# Patient Record
Sex: Female | Born: 1955 | Race: White | Hispanic: No | Smoking: Former smoker
Health system: Southern US, Community
[De-identification: ages and names within clinical notes are randomized; demographics above are authoritative.]

## PROBLEM LIST (undated history)

## (undated) DIAGNOSIS — E669 Obesity, unspecified: Secondary | ICD-10-CM

## (undated) DIAGNOSIS — E785 Hyperlipidemia, unspecified: Secondary | ICD-10-CM

## (undated) DIAGNOSIS — I10 Essential (primary) hypertension: Secondary | ICD-10-CM

## (undated) DIAGNOSIS — E119 Type 2 diabetes mellitus without complications: Secondary | ICD-10-CM

## (undated) HISTORY — DX: Essential (primary) hypertension: I10

## (undated) HISTORY — DX: Obesity, unspecified: E66.9

## (undated) HISTORY — PX: RHINOPLASTY: SUR1284

## (undated) HISTORY — DX: Hyperlipidemia, unspecified: E78.5

## (undated) HISTORY — DX: Type 2 diabetes mellitus without complications: E11.9

## (undated) HISTORY — PX: CHOLECYSTECTOMY: SHX55

---

## 1997-10-15 ENCOUNTER — Ambulatory Visit (HOSPITAL_COMMUNITY): Admission: RE | Admit: 1997-10-15 | Discharge: 1997-10-15 | Payer: Self-pay | Admitting: Family Medicine

## 1997-10-23 ENCOUNTER — Other Ambulatory Visit: Admission: RE | Admit: 1997-10-23 | Discharge: 1997-10-23 | Payer: Self-pay | Admitting: Family Medicine

## 1998-11-03 ENCOUNTER — Other Ambulatory Visit: Admission: RE | Admit: 1998-11-03 | Discharge: 1998-11-03 | Payer: Self-pay | Admitting: Family Medicine

## 2001-10-19 ENCOUNTER — Ambulatory Visit (HOSPITAL_COMMUNITY): Admission: RE | Admit: 2001-10-19 | Discharge: 2001-10-19 | Payer: Self-pay | Admitting: Family Medicine

## 2003-03-04 ENCOUNTER — Encounter: Payer: Self-pay | Admitting: *Deleted

## 2003-03-04 ENCOUNTER — Emergency Department (HOSPITAL_COMMUNITY): Admission: AD | Admit: 2003-03-04 | Discharge: 2003-03-04 | Payer: Self-pay | Admitting: *Deleted

## 2003-03-11 ENCOUNTER — Encounter: Payer: Self-pay | Admitting: General Surgery

## 2003-03-12 ENCOUNTER — Encounter: Payer: Self-pay | Admitting: General Surgery

## 2003-03-12 ENCOUNTER — Ambulatory Visit (HOSPITAL_COMMUNITY): Admission: RE | Admit: 2003-03-12 | Discharge: 2003-03-13 | Payer: Self-pay | Admitting: General Surgery

## 2005-11-30 ENCOUNTER — Other Ambulatory Visit: Admission: RE | Admit: 2005-11-30 | Discharge: 2005-11-30 | Payer: Self-pay | Admitting: Family Medicine

## 2012-06-15 ENCOUNTER — Other Ambulatory Visit: Payer: Self-pay | Admitting: *Deleted

## 2012-06-15 DIAGNOSIS — Z1231 Encounter for screening mammogram for malignant neoplasm of breast: Secondary | ICD-10-CM

## 2012-06-28 ENCOUNTER — Ambulatory Visit
Admission: RE | Admit: 2012-06-28 | Discharge: 2012-06-28 | Disposition: A | Discharge status: Home / Self Care | Payer: Private Health Insurance - Indemnity | Source: Ambulatory Visit | Attending: *Deleted | Admitting: *Deleted

## 2012-06-28 ENCOUNTER — Other Ambulatory Visit: Payer: Self-pay | Admitting: Family Medicine

## 2012-06-28 DIAGNOSIS — Z1231 Encounter for screening mammogram for malignant neoplasm of breast: Secondary | ICD-10-CM

## 2012-06-28 LAB — HM MAMMOGRAPHY: HM Mammogram: NORMAL

## 2012-07-17 ENCOUNTER — Encounter: Payer: Self-pay | Admitting: Family Medicine

## 2012-07-17 ENCOUNTER — Ambulatory Visit (INDEPENDENT_AMBULATORY_CARE_PROVIDER_SITE_OTHER): Payer: Private Health Insurance - Indemnity | Admitting: Family Medicine

## 2012-07-17 VITALS — BP 162/82 | HR 90 | Temp 97.8°F | Ht 61.0 in | Wt 292.0 lb

## 2012-07-17 DIAGNOSIS — E785 Hyperlipidemia, unspecified: Secondary | ICD-10-CM

## 2012-07-17 DIAGNOSIS — E669 Obesity, unspecified: Secondary | ICD-10-CM | POA: Insufficient documentation

## 2012-07-17 DIAGNOSIS — I10 Essential (primary) hypertension: Secondary | ICD-10-CM

## 2012-07-17 MED ORDER — LISINOPRIL-HYDROCHLOROTHIAZIDE 20-25 MG PO TABS: 1.0000 | ORAL_TABLET | Freq: Every day | ORAL | Status: DC

## 2012-07-17 NOTE — Progress Notes (Signed)
  Subjective:    Patient ID: Michelle Stark, female    DOB: 07/23/1955, 57 y.o.   MRN: [id number]  HPI  57 yo female here to establish care.  HTN- initially BP was 210/100 when she came in.  Was [facility] because had to have pap smear and mammogram before the end of the year. Per pt, they gave her rx for blood pressure medication but she wanted to talk with me about her blood pressure first.  She is asymptomatic.  Has a strong FH of HTN. She also states that she had blood work done there and was told cholesterol a little high but all other labs ok.  Under more stress lately.  Husband left her a few years ago, now has relatives living with her from [place].  Feels like she is coping pretty well.  Does not feel depressed.  No SI or HI.  Patient Active Problem List  Diagnosis  . Hypertension  . HLD (hyperlipidemia)  . Obesity  . Hyperlipidemia   Past Medical History  Diagnosis Date  . Hypertension   . Obesity   . Hyperlipidemia    Past Surgical History  Procedure Date  . Cholecystectomy   . Rhinoplasty    History  Substance Use Topics  . Smoking status: Former Games developer  . Smokeless tobacco: Not on file  . Alcohol Use: Not on file   No family history on file. Allergies  Allergen Reactions  . Bee Venom Anaphylaxis  . Ciprofloxacin Itching   Current Outpatient Prescriptions on File Prior to Visit  Medication Sig Dispense Refill  . lisinopril-hydrochlorothiazide (PRINZIDE,ZESTORETIC) 20-25 MG per tablet Take 1 tablet by mouth daily.  30 tablet  0   The PMH, PSH, Social History, Family History, Medications, and allergies have been reviewed in [facility], and have been updated if relevant.  Review of Systems See HPI Denies any CP, SOB, HA or blurred vision.    Objective:   Physical Exam BP 162/82  Pulse 90  Temp 97.8 F (36.6 C)  Ht 5\' 1"  (1.549 m)  Wt 292 lb (132.45 kg)  BMI 55.17 kg/m2  General: morbidly obese,well-nourished,in no acute  distress; alert,appropriate and cooperative throughout examination Head:  normocephalic and atraumatic.   Neck:  No deformities, masses, or tenderness noted. Breasts:  No mass, nodules, thickening, tenderness, bulging, retraction, inflamation, nipple discharge or skin changes noted.   Lungs:  Normal respiratory effort, chest expands symmetrically. Lungs are clear to auscultation, no crackles or wheezes. Heart:  Normal rate and regular rhythm. S1 and S2 normal without gallop, murmur, click, rub or other extra sounds. Msk:  No deformity or scoliosis noted of thoracic or lumbar spine.   Extremities:  No clubbing, cyanosis, edema, or deformity noted with normal full range of motion of all joints.   Neurologic:  alert & oriented X3 and gait normal.   Skin:  Intact without suspicious lesions or rashes Psych:  Cognition and judgment appear intact. Alert and cooperative with normal attention span and concentration. No apparent delusions, illusions, hallucinations        Assessment & Plan:   1. Hypertension  New- start Lisinopril-HCTZ.  Awaiting records with lab results from [facility] clinic. Follow up with me in 2 weeks. The patient indicates understanding of these issues and agrees with the plan.    2. HLD (hyperlipidemia)  Awaiting records.

## 2012-07-17 NOTE — Patient Instructions (Addendum)
Nice to meet you. Happy New Year!  We are starting Lisinopril- HCTZ.  Please come back and see in me in 2 weeks.  OMRON is my favorite home BP cuff.

## 2012-07-24 ENCOUNTER — Telehealth: Payer: Self-pay | Admitting: Family Medicine

## 2012-07-24 MED ORDER — LISINOPRIL-HYDROCHLOROTHIAZIDE 10-12.5 MG PO TABS: 1.0000 | ORAL_TABLET | Freq: Every day | ORAL | Status: DC

## 2012-07-24 NOTE — Telephone Encounter (Signed)
Pt called and stated that she was started on lisinopril-HCTZ last week and has since been having nausea.  She would like to know if she should take the medication at a different time of day or stop taking it altogether.  Please advise.

## 2012-07-24 NOTE — Telephone Encounter (Signed)
Patient notified as instructed by telephone. 

## 2012-07-24 NOTE — Telephone Encounter (Signed)
Let's try a lower dose and taking it at bedtime.  Rx sent to her pharmacy.  Please make sure she has follow up scheduled with me in next 1-2 weeks.

## 2012-07-31 ENCOUNTER — Encounter: Payer: Self-pay | Admitting: Family Medicine

## 2012-07-31 ENCOUNTER — Ambulatory Visit (INDEPENDENT_AMBULATORY_CARE_PROVIDER_SITE_OTHER): Payer: Private Health Insurance - Indemnity | Admitting: Family Medicine

## 2012-07-31 VITALS — BP 110/74 | HR 88 | Temp 98.2°F | Wt 292.0 lb

## 2012-07-31 DIAGNOSIS — I10 Essential (primary) hypertension: Secondary | ICD-10-CM

## 2012-07-31 DIAGNOSIS — Z136 Encounter for screening for cardiovascular disorders: Secondary | ICD-10-CM

## 2012-07-31 MED ORDER — LISINOPRIL-HYDROCHLOROTHIAZIDE 10-12.5 MG PO TABS: 1.0000 | ORAL_TABLET | Freq: Every day | ORAL | Status: DC

## 2012-07-31 NOTE — Patient Instructions (Addendum)
Good to see you. I am SO proud of you.  Keep taking your medication as directed.  We will call you with your lab results.  Come see me in a few months.

## 2012-07-31 NOTE — Progress Notes (Signed)
  Subjective:    Patient ID: Michelle Stark, female    DOB: 1956/04/16, 57 y.o.   MRN: [id number]  HPI  57 yo female here to follow up HTN.  HTN- started Lisinopril HCTZ at her last office visit on 07/17/2012.  She called one week later, complaining of nausea so we decreased her to to lisinopril-hctz 10-12.5 mg daily.  Nausea has resolved.  Has not noticed any adverse effects from lower dose.  No HA, SOB, CP, LE or blurred vision.  Patient Active Problem List  Diagnosis  . Hypertension  . HLD (hyperlipidemia)  . Obesity  . Hyperlipidemia   Past Medical History  Diagnosis Date  . Hypertension   . Obesity   . Hyperlipidemia    Past Surgical History  Procedure Date  . Cholecystectomy   . Rhinoplasty    History  Substance Use Topics  . Smoking status: Former Games developer  . Smokeless tobacco: Not on file  . Alcohol Use: Not on file   Family History  Problem Relation Age of Onset  . Hypertension Mother   . Hypertension Father    Allergies  Allergen Reactions  . Bee Venom Anaphylaxis  . Ciprofloxacin Itching   Current Outpatient Prescriptions on File Prior to Visit  Medication Sig Dispense Refill  . ibuprofen (ADVIL,MOTRIN) 200 MG tablet Take 200 mg by mouth every 6 (six) hours as needed.      Marland Kitchen lisinopril-hydrochlorothiazide (PRINZIDE,ZESTORETIC) 10-12.5 MG per tablet Take 1 tablet by mouth daily.  90 tablet  3  . Multiple Vitamins-Minerals (ADULT GUMMY PO) Take one by mouth daily       The PMH, PSH, Social History, Family History, Medications, and allergies have been reviewed in [facility], and have been updated if relevant.  Review of Systems See HPI     Objective:   Physical Exam BP 110/74  Pulse 88  Temp 98.2 F (36.8 C)  Wt 292 lb (132.45 kg)  General: morbidly obese,well-nourished,in no acute distress; alert,appropriate and cooperative throughout examination Head:  normocephalic and atraumatic.   Neck:  No deformities, masses, or tenderness noted. Breasts:   No mass, nodules, thickening, tenderness, bulging, retraction, inflamation, nipple discharge or skin changes noted.   Lungs:  Normal respiratory effort, chest expands symmetrically. Lungs are clear to auscultation, no crackles or wheezes. Heart:  Normal rate and regular rhythm. S1 and S2 normal without gallop, murmur, click, rub or other extra sounds. Msk:  No deformity or scoliosis noted of thoracic or lumbar spine.   Extremities:  No clubbing, cyanosis, edema, or deformity noted with normal full range of motion of all joints.   Neurologic:  alert & oriented X3 and gait normal.   Skin:  Intact without suspicious lesions or rashes Psych:  Cognition and judgment appear intact. Alert and cooperative with normal attention span and concentration. No apparent delusions, illusions, hallucinations    Assessment & Plan:   1. Hypertension    Improved! Check CMET today to make sure renal function and potassium remain ok. Continue current dose of HCTZ-lisinopril. Follow up in 3 months. The patient indicates understanding of these issues and agrees with the plan.

## 2012-08-01 LAB — COMPREHENSIVE METABOLIC PANEL
ALT: 53 U/L — ABNORMAL HIGH (ref 0–35)
AST: 45 U/L — ABNORMAL HIGH (ref 0–37)
Albumin: 3.8 g/dL (ref 3.5–5.2)
Alkaline Phosphatase: 94 U/L (ref 39–117)
BUN: 16 mg/dL (ref 6–23)
CO2: 27 mEq/L (ref 19–32)
Calcium: 9.5 mg/dL (ref 8.4–10.5)
Chloride: 98 mEq/L (ref 96–112)
Creatinine, Ser: 0.7 mg/dL (ref 0.4–1.2)
GFR: 91.8 mL/min (ref >60.00)
Glucose, Bld: 109 mg/dL — ABNORMAL HIGH (ref 70–99)
Potassium: 4.1 mEq/L (ref 3.5–5.1)
Sodium: 135 mEq/L (ref 135–145)
Total Bilirubin: 0.4 mg/dL (ref 0.3–1.2)
Total Protein: 7.7 g/dL (ref 6.0–8.3)

## 2012-08-01 LAB — LIPID PANEL
Cholesterol: 223 mg/dL — ABNORMAL HIGH (ref 0–200)
HDL: 45.1 mg/dL (ref >39.00)
Total CHOL/HDL Ratio: 5
Triglycerides: 265 mg/dL — ABNORMAL HIGH (ref 0.0–149.0)
VLDL: 53 mg/dL — ABNORMAL HIGH (ref 0.0–40.0)

## 2012-08-01 LAB — LDL CHOLESTEROL, DIRECT: Direct LDL: 133.7 mg/dL

## 2012-10-12 ENCOUNTER — Ambulatory Visit (INDEPENDENT_AMBULATORY_CARE_PROVIDER_SITE_OTHER): Payer: Private Health Insurance - Indemnity | Admitting: Family Medicine

## 2012-10-12 ENCOUNTER — Encounter: Payer: Self-pay | Admitting: Family Medicine

## 2012-10-12 VITALS — BP 120/76 | HR 88 | Temp 97.6°F | Wt 297.0 lb

## 2012-10-12 DIAGNOSIS — E669 Obesity, unspecified: Secondary | ICD-10-CM

## 2012-10-12 DIAGNOSIS — I10 Essential (primary) hypertension: Secondary | ICD-10-CM

## 2012-10-12 MED ORDER — LOSARTAN POTASSIUM 25 MG PO TABS: 25.0000 mg | ORAL_TABLET | Freq: Every day | ORAL | Status: DC

## 2012-10-12 NOTE — Progress Notes (Signed)
Subjective:    Patient ID: Michelle Stark, female    DOB: [birth year], 57 y.o.   MRN: [id number]  HPI  57 yo female here to follow up HTN.  HTN- started Lisinopril HCTZ at  last office visit on 07/17/2012.  She called one week later, complaining of nausea so we decreased her to to lisinopril-hctz 10-12.5 mg daily.  Nausea has resolved and she noted no adverse effects at that time.  BP very well controlled but now she is concerned that lisinopril is causing a dry cough.  It doesn't bother her but she notices it occuring daily.  She does not think it is due to GERD or allergic rhinitis.  No HA, SOB, CP, LE or blurred vision.  Patient Active Problem List  Diagnosis  . Hypertension  . HLD (hyperlipidemia)  . Obesity  . Hyperlipidemia   Past Medical History  Diagnosis Date  . Hypertension   . Obesity   . Hyperlipidemia    Past Surgical History  Procedure Laterality Date  . Cholecystectomy    . Rhinoplasty     History  Substance Use Topics  . Smoking status: Former Games developer  . Smokeless tobacco: Not on file  . Alcohol Use: Not on file   Family History  Problem Relation Age of Onset  . Hypertension Mother   . Hypertension Father    Allergies  Allergen Reactions  . Bee Venom Anaphylaxis  . Ciprofloxacin Itching   Current Outpatient Prescriptions on File Prior to Visit  Medication Sig Dispense Refill  . ibuprofen (ADVIL,MOTRIN) 200 MG tablet Take 200 mg by mouth every 6 (six) hours as needed.      Marland Kitchen lisinopril-hydrochlorothiazide (PRINZIDE,ZESTORETIC) 10-12.5 MG per tablet Take 1 tablet by mouth daily.  90 tablet  3  . Multiple Vitamins-Minerals (ADULT GUMMY PO) Take one by mouth daily       No current facility-administered medications on file prior to visit.   The PMH, PSH, Social History, Family History, Medications, and allergies have been reviewed in [facility], and have been updated if relevant.  Review of Systems See HPI     Objective:   Physical Exam BP 120/76   Pulse 88  Temp(Src) 97.6 F (36.4 C)  Wt 297 lb (134.718 kg)  BMI 56.15 kg/m2 Wt Readings from Last 3 Encounters:  10/12/12 297 lb (134.718 kg)  07/31/12 292 lb (132.45 kg)  07/17/12 292 lb (132.45 kg)    General: morbidly obese,well-nourished,in no acute distress; alert,appropriate and cooperative throughout examination Head:  normocephalic and atraumatic.   Lungs:  Normal respiratory effort, chest expands symmetrically. Lungs are clear to auscultation, no crackles or wheezes. Heart:  Normal rate and regular rhythm. S1 and S2 normal without gallop, murmur, click, rub or other extra sounds. Msk:  No deformity or scoliosis noted of thoracic or lumbar spine.   Extremities:  No clubbing, cyanosis, edema, or deformity noted with normal full range of motion of all joints.   Neurologic:  alert & oriented X3 and gait normal.   Skin:  Intact without suspicious lesions or rashes Psych:  Cognition and judgment appear intact. Alert and cooperative with normal attention span and concentration. No apparent delusions, illusions, hallucinations    Assessment & Plan:   1. Hypertension Well controlled but now cough with ACEI. Will try ARB without HCTZ as she states she forgot to take her medication for a week and it was not elevated about 140/80. She will check BPs at home and she will call me readings next  week. She is aware ARB my still cause cough.

## 2012-10-12 NOTE — Patient Instructions (Addendum)
Good to see you. We are STOPPING your lisinopril- HCTZ. We are starting Losartan 25 mg daily. Please check your blood pressure daily at home.  Call me next week with an update.

## 2016-01-05 ENCOUNTER — Other Ambulatory Visit: Payer: Self-pay | Admitting: Family Medicine

## 2019-01-24 ENCOUNTER — Encounter: Payer: Private Health Insurance - Indemnity | Admitting: Family Medicine

## 2019-02-26 ENCOUNTER — Encounter: Payer: Private Health Insurance - Indemnity | Admitting: Family Medicine

## 2019-04-02 ENCOUNTER — Ambulatory Visit: Payer: Private Health Insurance - Indemnity | Admitting: Family Medicine

## 2019-04-10 ENCOUNTER — Ambulatory Visit: Payer: Private Health Insurance - Indemnity | Admitting: Family Medicine

## 2019-04-24 ENCOUNTER — Encounter: Payer: Private Health Insurance - Indemnity | Admitting: Family Medicine

## 2020-04-29 ENCOUNTER — Emergency Department: Payer: No Typology Code available for payment source

## 2020-04-29 ENCOUNTER — Other Ambulatory Visit: Payer: Self-pay

## 2020-04-29 ENCOUNTER — Inpatient Hospital Stay
Admission: EM | Admit: 2020-04-29 | Discharge: 2020-05-12 | DRG: 853 | Disposition: A | Discharge status: Home Health | Payer: No Typology Code available for payment source | Attending: Internal Medicine | Admitting: Internal Medicine

## 2020-04-29 ENCOUNTER — Encounter: Payer: Self-pay | Admitting: Emergency Medicine

## 2020-04-29 ENCOUNTER — Observation Stay: Payer: No Typology Code available for payment source

## 2020-04-29 DIAGNOSIS — L97229 Non-pressure chronic ulcer of left calf with unspecified severity: Secondary | ICD-10-CM | POA: Diagnosis present

## 2020-04-29 DIAGNOSIS — E1110 Type 2 diabetes mellitus with ketoacidosis without coma: Secondary | ICD-10-CM | POA: Diagnosis present

## 2020-04-29 DIAGNOSIS — Z6841 Body Mass Index (BMI) 40.0 and over, adult: Secondary | ICD-10-CM

## 2020-04-29 DIAGNOSIS — E781 Pure hyperglyceridemia: Secondary | ICD-10-CM | POA: Diagnosis present

## 2020-04-29 DIAGNOSIS — E669 Obesity, unspecified: Secondary | ICD-10-CM | POA: Diagnosis present

## 2020-04-29 DIAGNOSIS — Z6830 Body mass index (BMI) 30.0-30.9, adult: Secondary | ICD-10-CM

## 2020-04-29 DIAGNOSIS — E876 Hypokalemia: Secondary | ICD-10-CM | POA: Diagnosis present

## 2020-04-29 DIAGNOSIS — E1169 Type 2 diabetes mellitus with other specified complication: Secondary | ICD-10-CM

## 2020-04-29 DIAGNOSIS — Z794 Long term (current) use of insulin: Secondary | ICD-10-CM

## 2020-04-29 DIAGNOSIS — D75838 Other thrombocytosis: Secondary | ICD-10-CM | POA: Diagnosis present

## 2020-04-29 DIAGNOSIS — A419 Sepsis, unspecified organism: Secondary | ICD-10-CM | POA: Diagnosis not present

## 2020-04-29 DIAGNOSIS — L03317 Cellulitis of buttock: Secondary | ICD-10-CM | POA: Diagnosis present

## 2020-04-29 DIAGNOSIS — N179 Acute kidney failure, unspecified: Secondary | ICD-10-CM

## 2020-04-29 DIAGNOSIS — L0231 Cutaneous abscess of buttock: Secondary | ICD-10-CM | POA: Diagnosis present

## 2020-04-29 DIAGNOSIS — A401 Sepsis due to streptococcus, group B: Principal | ICD-10-CM | POA: Diagnosis present

## 2020-04-29 DIAGNOSIS — B49 Unspecified mycosis: Secondary | ICD-10-CM

## 2020-04-29 DIAGNOSIS — Z87442 Personal history of urinary calculi: Secondary | ICD-10-CM

## 2020-04-29 DIAGNOSIS — L89153 Pressure ulcer of sacral region, stage 3: Secondary | ICD-10-CM | POA: Diagnosis present

## 2020-04-29 DIAGNOSIS — Z87891 Personal history of nicotine dependence: Secondary | ICD-10-CM

## 2020-04-29 DIAGNOSIS — Z881 Allergy status to other antibiotic agents status: Secondary | ICD-10-CM

## 2020-04-29 DIAGNOSIS — I214 Non-ST elevation (NSTEMI) myocardial infarction: Secondary | ICD-10-CM

## 2020-04-29 DIAGNOSIS — R778 Other specified abnormalities of plasma proteins: Secondary | ICD-10-CM

## 2020-04-29 DIAGNOSIS — I21A1 Myocardial infarction type 2: Secondary | ICD-10-CM | POA: Diagnosis present

## 2020-04-29 DIAGNOSIS — K611 Rectal abscess: Secondary | ICD-10-CM | POA: Diagnosis present

## 2020-04-29 DIAGNOSIS — M726 Necrotizing fasciitis: Secondary | ICD-10-CM | POA: Diagnosis present

## 2020-04-29 DIAGNOSIS — E6609 Other obesity due to excess calories: Secondary | ICD-10-CM

## 2020-04-29 DIAGNOSIS — E785 Hyperlipidemia, unspecified: Secondary | ICD-10-CM | POA: Diagnosis present

## 2020-04-29 DIAGNOSIS — I10 Essential (primary) hypertension: Secondary | ICD-10-CM | POA: Diagnosis present

## 2020-04-29 DIAGNOSIS — Z20822 Contact with and (suspected) exposure to covid-19: Secondary | ICD-10-CM | POA: Diagnosis present

## 2020-04-29 DIAGNOSIS — R6510 Systemic inflammatory response syndrome (SIRS) of non-infectious origin without acute organ dysfunction: Secondary | ICD-10-CM

## 2020-04-29 DIAGNOSIS — R000 Tachycardia, unspecified: Secondary | ICD-10-CM

## 2020-04-29 DIAGNOSIS — Z79899 Other long term (current) drug therapy: Secondary | ICD-10-CM

## 2020-04-29 DIAGNOSIS — Z8249 Family history of ischemic heart disease and other diseases of the circulatory system: Secondary | ICD-10-CM

## 2020-04-29 DIAGNOSIS — G9341 Metabolic encephalopathy: Secondary | ICD-10-CM | POA: Diagnosis present

## 2020-04-29 DIAGNOSIS — L02215 Cutaneous abscess of perineum: Secondary | ICD-10-CM | POA: Diagnosis present

## 2020-04-29 DIAGNOSIS — I252 Old myocardial infarction: Secondary | ICD-10-CM

## 2020-04-29 DIAGNOSIS — R531 Weakness: Secondary | ICD-10-CM

## 2020-04-29 DIAGNOSIS — Z888 Allergy status to other drugs, medicaments and biological substances status: Secondary | ICD-10-CM

## 2020-04-29 DIAGNOSIS — E1165 Type 2 diabetes mellitus with hyperglycemia: Secondary | ICD-10-CM

## 2020-04-29 DIAGNOSIS — E860 Dehydration: Secondary | ICD-10-CM | POA: Diagnosis present

## 2020-04-29 HISTORY — DX: Acute kidney failure, unspecified: N17.9

## 2020-04-29 HISTORY — DX: Type 2 diabetes mellitus with ketoacidosis without coma: E11.10

## 2020-04-29 HISTORY — DX: Type 2 diabetes mellitus with other specified complication: E11.69

## 2020-04-29 LAB — BLOOD GAS, VENOUS
Acid-base deficit: 24.9 mmol/L — ABNORMAL HIGH (ref 0.0–2.0)
Bicarbonate: 4.9 mmol/L — ABNORMAL LOW (ref 20.0–28.0)
O2 Saturation: 60.7 %
Patient temperature: 37
pCO2, Ven: 20 mmHg — ABNORMAL LOW (ref 44.0–60.0)
pH, Ven: 7 — CL (ref 7.250–7.430)
pO2, Ven: 50 mmHg — ABNORMAL HIGH (ref 32.0–45.0)

## 2020-04-29 LAB — CBC
HCT: 47.2 % — ABNORMAL HIGH (ref 36.0–46.0)
HCT: 47.9 % — ABNORMAL HIGH (ref 36.0–46.0)
Hemoglobin: 16 g/dL — ABNORMAL HIGH (ref 12.0–15.0)
Hemoglobin: 16 g/dL — ABNORMAL HIGH (ref 12.0–15.0)
MCH: 31.2 pg (ref 26.0–34.0)
MCH: 31.3 pg (ref 26.0–34.0)
MCHC: 33.9 g/dL (ref 30.0–36.0)
MCHC: 33.4 g/dL (ref 30.0–36.0)
MCV: 92 fL (ref 80.0–100.0)
MCV: 93.7 fL (ref 80.0–100.0)
Platelets: 594 10*3/uL — ABNORMAL HIGH (ref 150–400)
Platelets: 608 10*3/uL — ABNORMAL HIGH (ref 150–400)
RBC: 5.13 MIL/uL — ABNORMAL HIGH (ref 3.87–5.11)
RBC: 5.11 MIL/uL (ref 3.87–5.11)
RDW: 14.1 % (ref 11.5–15.5)
RDW: 14.6 % (ref 11.5–15.5)
WBC: 35.3 10*3/uL — ABNORMAL HIGH (ref 4.0–10.5)
WBC: 35.6 10*3/uL — ABNORMAL HIGH (ref 4.0–10.5)
nRBC: 0 % (ref 0.0–0.2)
nRBC: 0.1 % (ref 0.0–0.2)

## 2020-04-29 LAB — LACTIC ACID, PLASMA
Lactic Acid, Venous: 2 mmol/L (ref 0.5–1.9)
Lactic Acid, Venous: 2.5 mmol/L (ref 0.5–1.9)

## 2020-04-29 LAB — URINALYSIS, COMPLETE (UACMP) WITH MICROSCOPIC
Bilirubin Urine: NEGATIVE
Glucose, UA: >=500 mg/dL — AB
Ketones, ur: 80 mg/dL — AB
Nitrite: NEGATIVE
Protein, ur: 100 mg/dL — AB
Specific Gravity, Urine: 1.026 (ref 1.005–1.030)
pH: 5 (ref 5.0–8.0)

## 2020-04-29 LAB — DIFFERENTIAL
Abs Immature Granulocytes: 0.87 10*3/uL — ABNORMAL HIGH (ref 0.00–0.07)
Basophils Absolute: 0 10*3/uL (ref 0.0–0.1)
Basophils Relative: 0 %
Eosinophils Absolute: 0 10*3/uL (ref 0.0–0.5)
Eosinophils Relative: 0 %
Immature Granulocytes: 2 %
Lymphocytes Relative: 3 %
Lymphs Abs: 1.2 10*3/uL (ref 0.7–4.0)
Monocytes Absolute: 2.3 10*3/uL — ABNORMAL HIGH (ref 0.1–1.0)
Monocytes Relative: 7 %
Neutro Abs: 31.1 10*3/uL — ABNORMAL HIGH (ref 1.7–7.7)
Neutrophils Relative %: 88 %
Smear Review: NORMAL

## 2020-04-29 LAB — BASIC METABOLIC PANEL
Anion gap: 19 — ABNORMAL HIGH (ref 5–15)
BUN: 43 mg/dL — ABNORMAL HIGH (ref 8–23)
BUN: 47 mg/dL — ABNORMAL HIGH (ref 8–23)
CO2: <7 mmol/L — ABNORMAL LOW (ref 22–32)
CO2: 11 mmol/L — ABNORMAL LOW (ref 22–32)
Calcium: 8.1 mg/dL — ABNORMAL LOW (ref 8.9–10.3)
Calcium: 9 mg/dL (ref 8.9–10.3)
Chloride: 106 mmol/L (ref 98–111)
Chloride: 105 mmol/L (ref 98–111)
Creatinine, Ser: 1.37 mg/dL — ABNORMAL HIGH (ref 0.44–1.00)
Creatinine, Ser: 1.42 mg/dL — ABNORMAL HIGH (ref 0.44–1.00)
GFR, Estimated: 41 mL/min — ABNORMAL LOW (ref >60)
GFR, Estimated: 39 mL/min — ABNORMAL LOW (ref >60)
Glucose, Bld: 527 mg/dL (ref 70–99)
Glucose, Bld: 408 mg/dL — ABNORMAL HIGH (ref 70–99)
Potassium: 3.4 mmol/L — ABNORMAL LOW (ref 3.5–5.1)
Potassium: 3.4 mmol/L — ABNORMAL LOW (ref 3.5–5.1)
Sodium: 134 mmol/L — ABNORMAL LOW (ref 135–145)
Sodium: 135 mmol/L (ref 135–145)

## 2020-04-29 LAB — GLUCOSE, CAPILLARY
Glucose-Capillary: >600 mg/dL (ref 70–99)
Glucose-Capillary: 589 mg/dL (ref 70–99)
Glucose-Capillary: 502 mg/dL (ref 70–99)
Glucose-Capillary: 447 mg/dL — ABNORMAL HIGH (ref 70–99)
Glucose-Capillary: 402 mg/dL — ABNORMAL HIGH (ref 70–99)
Glucose-Capillary: 218 mg/dL — ABNORMAL HIGH (ref 70–99)
Glucose-Capillary: 403 mg/dL — ABNORMAL HIGH (ref 70–99)

## 2020-04-29 LAB — URINE DRUG SCREEN, QUALITATIVE (ARMC ONLY)
Amphetamines, Ur Screen: NOT DETECTED
Barbiturates, Ur Screen: NOT DETECTED
Benzodiazepine, Ur Scrn: NOT DETECTED
Cannabinoid 50 Ng, Ur ~~LOC~~: NOT DETECTED
Cocaine Metabolite,Ur ~~LOC~~: NOT DETECTED
MDMA (Ecstasy)Ur Screen: NOT DETECTED
Methadone Scn, Ur: NOT DETECTED
Opiate, Ur Screen: NOT DETECTED
Phencyclidine (PCP) Ur S: NOT DETECTED
Tricyclic, Ur Screen: NOT DETECTED

## 2020-04-29 LAB — PROTIME-INR
INR: 1.3 — ABNORMAL HIGH (ref 0.8–1.2)
Prothrombin Time: 15.4 seconds — ABNORMAL HIGH (ref 11.4–15.2)

## 2020-04-29 LAB — ETHANOL: Alcohol, Ethyl (B): <10 mg/dL (ref <10)

## 2020-04-29 LAB — RESPIRATORY PANEL BY RT PCR (FLU A&B, COVID)
Influenza A by PCR: NEGATIVE
Influenza B by PCR: NEGATIVE
SARS Coronavirus 2 by RT PCR: NEGATIVE

## 2020-04-29 LAB — OSMOLALITY, URINE: Osmolality, Ur: 482 mOsm/kg (ref 300–900)

## 2020-04-29 LAB — TSH: TSH: 0.719 u[IU]/mL (ref 0.350–4.500)

## 2020-04-29 LAB — OSMOLALITY: Osmolality: 337 mOsm/kg (ref 275–295)

## 2020-04-29 LAB — CREATININE, URINE, RANDOM: Creatinine, Urine: 28 mg/dL

## 2020-04-29 LAB — TROPONIN I (HIGH SENSITIVITY): Troponin I (High Sensitivity): 2104 ng/L (ref <18)

## 2020-04-29 LAB — BETA-HYDROXYBUTYRIC ACID: Beta-Hydroxybutyric Acid: 7.45 mmol/L — ABNORMAL HIGH (ref 0.05–0.27)

## 2020-04-29 LAB — SODIUM, URINE, RANDOM: Sodium, Ur: 43 mmol/L

## 2020-04-29 LAB — CK: Total CK: 219 U/L (ref 38–234)

## 2020-04-29 MED ORDER — SODIUM BICARBONATE 8.4 % IV SOLN
50.0000 meq | Freq: Once | INTRAVENOUS | Status: AC
  Administered 2020-04-29: 50 meq via INTRAVENOUS
  Filled 2020-04-29: qty 50

## 2020-04-29 MED ORDER — SODIUM CHLORIDE 0.9 % IV BOLUS
1000.0000 mL | Freq: Once | INTRAVENOUS | Status: AC
  Administered 2020-04-29: 1000 mL via INTRAVENOUS

## 2020-04-29 MED ORDER — LACTATED RINGERS IV BOLUS
1000.0000 mL | Freq: Once | INTRAVENOUS | Status: AC
  Administered 2020-04-29: 1000 mL via INTRAVENOUS

## 2020-04-29 MED ORDER — INSULIN REGULAR(HUMAN) IN NACL 100-0.9 UT/100ML-% IV SOLN
INTRAVENOUS | Status: DC
  Administered 2020-04-29: 12 [IU]/h via INTRAVENOUS
  Administered 2020-04-30: 5.5 [IU]/h via INTRAVENOUS
  Administered 2020-05-01: 4.4 [IU]/h via INTRAVENOUS
  Filled 2020-04-29 (×3): qty 100

## 2020-04-29 MED ORDER — DEXTROSE 50 % IV SOLN: 0.0000 mL | INTRAVENOUS | Status: DC | PRN

## 2020-04-29 MED ORDER — DEXTROSE IN LACTATED RINGERS 5 % IV SOLN: INTRAVENOUS | Status: DC

## 2020-04-29 MED ORDER — VANCOMYCIN HCL 750 MG/150ML IV SOLN
750.0000 mg | Freq: Two times a day (BID) | INTRAVENOUS | Status: DC
  Administered 2020-04-30: 750 mg via INTRAVENOUS
  Filled 2020-04-29 (×3): qty 150

## 2020-04-29 MED ORDER — POTASSIUM CHLORIDE 10 MEQ/100ML IV SOLN
10.0000 meq | INTRAVENOUS | Status: AC
  Administered 2020-04-29 (×4): 10 meq via INTRAVENOUS
  Filled 2020-04-29 (×4): qty 100

## 2020-04-29 MED ORDER — VANCOMYCIN HCL 1250 MG/250ML IV SOLN
1250.0000 mg | Freq: Once | INTRAVENOUS | Status: AC
  Administered 2020-04-29: 1250 mg via INTRAVENOUS
  Filled 2020-04-29: qty 250

## 2020-04-29 MED ORDER — VANCOMYCIN HCL 750 MG/150ML IV SOLN
750.0000 mg | Freq: Once | INTRAVENOUS | Status: AC
  Administered 2020-04-30: 750 mg via INTRAVENOUS
  Filled 2020-04-29: qty 150

## 2020-04-29 MED ORDER — SODIUM CHLORIDE 0.9 % IV SOLN
2.0000 g | Freq: Once | INTRAVENOUS | Status: DC
  Administered 2020-04-29: 2 g via INTRAVENOUS
  Filled 2020-04-29: qty 2

## 2020-04-29 MED ORDER — CLINDAMYCIN PHOSPHATE 900 MG/50ML IV SOLN
900.0000 mg | Freq: Three times a day (TID) | INTRAVENOUS | Status: DC
  Administered 2020-04-29 – 2020-04-30 (×2): 900 mg via INTRAVENOUS
  Filled 2020-04-29 (×5): qty 50

## 2020-04-29 MED ORDER — PIPERACILLIN-TAZOBACTAM 3.375 G IVPB
3.3750 g | Freq: Three times a day (TID) | INTRAVENOUS | Status: DC
  Administered 2020-04-30 – 2020-05-06 (×19): 3.375 g via INTRAVENOUS
  Filled 2020-04-29 (×19): qty 50

## 2020-04-29 MED ORDER — IOHEXOL 300 MG/ML  SOLN
75.0000 mL | Freq: Once | INTRAMUSCULAR | Status: AC | PRN
  Administered 2020-04-29: 75 mL via INTRAVENOUS

## 2020-04-29 MED ORDER — LACTATED RINGERS IV SOLN: INTRAVENOUS | Status: DC

## 2020-04-29 NOTE — ED Notes (Signed)
Patient transported to CT 

## 2020-04-29 NOTE — H&P (Addendum)
Michelle Stark RPR:[id number] DOB: May 25, 1956 DOA: 04/29/2020     PCP: Patient, No Pcp Per   Outpatient Specialists:  NONE   Patient arrived to ER on 04/29/20 at 1411 Referred by Attending Vladimir Crofts, MD   Patient coming from: home Lives alone,       Chief Complaint:  Chief Complaint  Patient presents with   Nausea    w/ vomiting, excessive thirst x past few days. Blood sugar 404 no known diabetes    HPI: ROSENA Stark is a 64 y.o. female with medical history significant of HTN, obesity. HLD    Presented with N/V/ increased thirst and fatigue. Patient was unaware she is diabetic Her BG per EMS was 404 Patient was concerned that she felt like she was passing a kidney stone on the left side for the past 4 days with left lower quadrant pain radiating to her left groin. Nonbloody nonbilious emesis. She eventually did pass a stone about 4 days ago and since then the nausea started to feel better but she has been feeling very weak and still had polyuria and polydipsia. She has been having some dysuria as well while was passing a stone but it has resolved since. She has had any fevers or chills no chest pain abdominal pain besides the flank pain over past 4 days. Her son thought that she was looking ill and convince her to call EMS  Per family Pt has not been going to any of her doctor and have not been taking any of her medications.    Infectious risk factors:  Reports  N/V severe fatigue     Has  NOt been vaccinated against COVID    Initial COVID TEST  NEGATIVE    Lab Results  Component Value Date   [place] NEGATIVE 04/29/2020    Regarding pertinent Chronic problems:     Hyperlipidemia -  Not on statins Lipid Panel     Component Value Date/Time   CHOL 223 (H) 07/31/2012 1558   TRIG 265.0 (H) 07/31/2012 1558   HDL 45.10 07/31/2012 1558   CHOLHDL 5 07/31/2012 1558   VLDL 53.0 (H) 07/31/2012 1558   LDLDIRECT 133.7 07/31/2012 1558     HTN  Was on  losartan not anymore  obesity-   BMI Readings from Last 1 Encounters:  04/29/20 33.29 kg/m     While in ER: On arrival noted to have elevated white blood cell count up to thirteen 5.3 and hemoglobin up to 16 Patient tachycardic up to 128 Was given a bolus of IV fluids Blood sugar was noted to be 527 Patient started on insulin drip CT abdomen showed no stones status post cholecystectomy non acute Patient appears to be in DKA Given IV fluids dose of bicarb and potassium  Hospitalist was called for admission for DKA in the setting of new diagnosis of diabetes with dehydration and AKI  The following Work up has been ordered so far:  Orders Placed This Encounter  Procedures   Respiratory Panel by RT PCR (Flu A&B, Covid) - Nasopharyngeal Swab   Blood culture (routine x 2)   DG Chest Portable 1 View   CT ABDOMEN PELVIS W CONTRAST   CBC   Urinalysis, Complete w Microscopic   Beta-hydroxybutyric acid   Basic metabolic panel   Differential   Urinalysis, Routine w reflex microscopic   Blood gas, venous   CBC   Glucose, capillary   Diet NPO time specified   Saline Lock IV, Maintain  IV access   Check temperature   Cardiac monitoring   Initiate Carrier Fluid Protocol   Notify physician   If present, discontinue Insulin Pump after IV Insulin is initiated.   Do NOT use lab glucose values in [employer].  If CBG meter reads "Critical High", enter 600.   IV insulin infusion with sufficient glucose should be continued until MD determines acidosis is corrected and places transition orders.   Upon IV fluid bolus completion, place order for STAT BMET (LAB15) and call provider with results.   IV bolus already initiated   Consult to hospitalist  ALL PATIENTS BEING ADMITTED/HAVING PROCEDURES NEED COVID-19 SCREENING   Pulse oximetry, continuous   CBG monitoring, ED   EKG 12-Lead   Insert peripheral IV     Following Medications were ordered in ER: Medications   insulin regular, human (MYXREDLIN) 100 units/ 100 mL infusion (has no administration in time range)  lactated ringers infusion (has no administration in time range)  dextrose 5 % in lactated ringers infusion (has no administration in time range)  dextrose 50 % solution 0-50 mL (has no administration in time range)  potassium chloride 10 mEq in 100 mL IVPB (has no administration in time range)  sodium chloride 0.9 % bolus 1,000 mL (1,000 mLs Intravenous Bolus from Bag 04/29/20 1519)  lactated ringers bolus 1,000 mL (1,000 mLs Intravenous New Bag/Given 04/29/20 1737)  iohexol (OMNIPAQUE) 300 MG/ML solution 75 mL (75 mLs Intravenous Contrast Given 04/29/20 1846)        Consult Orders  (From admission, onward)         Start     Ordered   04/29/20 1904  Consult to hospitalist  ALL PATIENTS BEING ADMITTED/HAVING PROCEDURES NEED COVID-19 SCREENING  Once       Comments: ALL PATIENTS BEING ADMITTED/HAVING PROCEDURES NEED COVID-19 SCREENING  Provider:  (Not yet assigned)  Question Answer Comment  Place call to: hospitalist   Reason for Consult Admit   Diagnosis/Clinical Info for Consult:  Room 19. Charlsie Quest, [id number]     04/29/20 1903           Significant initial  Findings: Abnormal Labs Reviewed  CBC - Abnormal; Notable for the following components:      Result Value   WBC 35.3 (*)    RBC 5.13 (*)    Hemoglobin 16.0 (*)    HCT 47.2 (*)    Platelets 594 (*)    All other components within normal limits  BETA-HYDROXYBUTYRIC ACID - Abnormal; Notable for the following components:   Beta-Hydroxybutyric Acid 7.45 (*)    All other components within normal limits  BASIC METABOLIC PANEL - Abnormal; Notable for the following components:   Sodium 134 (*)    Potassium 3.4 (*)    CO2 <7 (*)    Glucose, Bld 527 (*)    BUN 43 (*)    Creatinine, Ser 1.37 (*)    Calcium 8.1 (*)    GFR, Estimated 41 (*)    All other components within normal limits  CBC - Abnormal; Notable for the following  components:   Hemoglobin 16.0 (*)    HCT 47.9 (*)    Platelets 608 (*)    All other components within normal limits  GLUCOSE, CAPILLARY - Abnormal; Notable for the following components:   Glucose-Capillary >600 (*)    All other components within normal limits  Otherwise labs showing:    Recent Labs  Lab 04/29/20 1725  NA 134*  K 3.4*  CO2 <  7*  GLUCOSE 527*  BUN 43*  CREATININE 1.37*  CALCIUM 8.1*    Cr   Up from baseline see below Lab Results  Component Value Date   CREATININE 1.37 (H) 04/29/2020   CREATININE 0.7 07/31/2012    No results for input(s): AST, ALT, ALKPHOS, BILITOT, PROT, ALBUMIN in the last 168 hours. Lab Results  Component Value Date   CALCIUM 8.1 (L) 04/29/2020     WBC      Component Value Date/Time   WBC 35.3 (H) 04/29/2020 1411   WBC PENDING 04/29/2020 1411    Plt: Lab Results  Component Value Date   PLT 594 (H) 04/29/2020   PLT 608 (H) 04/29/2020    Lactic Acid, Venous No results found for: LATICACIDVEN    Procalcitonin  Ordered     Venous  Blood Gas result:  PH 7.00  pCO2 20    HG/HCT Up from baseline see below    Component Value Date/Time   HGB 16.0 (H) 04/29/2020 1411   HGB 16.0 (H) 04/29/2020 1411   HCT 47.2 (H) 04/29/2020 1411   HCT 47.9 (H) 04/29/2020 1411   MCV 92.0 04/29/2020 1411   MCV 93.7 04/29/2020 1411    No results for input(s): LIPASE, AMYLASE in the last 168 hours. No results for input(s): AMMONIA in the last 168 hours.    Troponin ordered Cardiac Panel (last 3 results)   Recent Labs    04/29/20 1725  CKTOTAL 219       ECG: Ordered Personally reviewed by me showing: HR : 128 Rhythm:   Sinus tachycardia poor R wave progression Ischemic changes*nonspecific changes, no evidence of ischemic changes QTC 423     CBG (last 3)  Recent Labs    04/29/20 1420 04/29/20 1916 04/29/20 1958  GLUCAP >600* 589* 502*    Beta-Hydroxybutyric Acid 7.45 (*)     UA  ordered   Urine analysis: No results  found for: COLORURINE, APPEARANCEUR, LABSPEC, PHURINE, GLUCOSEU, HGBUR, BILIRUBINUR, KETONESUR, PROTEINUR, UROBILINOGEN, NITRITE, LEUKOCYTESUR     Ordered  CXR -  NON acute  CTabd/pelvis - nonacute   ED Triage Vitals  Enc Vitals Group     BP 04/29/20 1422 (!) 140/110     Pulse Rate 04/29/20 1422 (!) 122     Resp 04/29/20 1422 (!) 36     Temp 04/29/20 1518 (!) 96 F (35.6 C)     Temp Source 04/29/20 1518 Rectal     SpO2 04/29/20 1414 99 %     Weight 04/29/20 1418 182 lb (82.6 kg)     Height 04/29/20 1418 '5\' 2"'  (1.575 m)     Head Circumference --      Peak Flow --      Pain Score 04/29/20 1418 0     Pain Loc --      Pain Edu? --      Excl. in [place]? --   TMAX(24)@       Latest  Blood pressure (!) 157/114, pulse (!) 117, temperature (!) 96 F (35.6 C), temperature source Rectal, resp. rate (!) 28, height '5\' 2"'  (1.575 m), weight 82.6 kg, SpO2 100 %.   Review of Systems:    Pertinent positives include: fatigue  nausea, vomiting,  Constitutional:  No weight loss, night sweats, Fevers, chills, weight loss dysuria HEENT:  No headaches, Difficulty swallowing,Tooth/dental problems,Sore throat,  No sneezing, itching, ear ache, nasal congestion, post nasal drip,  Cardio-vascular:  No chest pain, Orthopnea, PND, anasarca, dizziness, palpitations.no Bilateral  lower extremity swelling  GI:  No heartburn, indigestion, abdominal pain, diarrhea, change in bowel habits, loss of appetite, melena, blood in stool, hematemesis Resp:  no shortness of breath at rest. No dyspnea on exertion, No excess mucus, no productive cough, No non-productive cough, No coughing up of blood.No change in color of mucus.No wheezing. Skin:  no rash or lesions. No jaundice GU:  no , change in color of urine, no urgency or frequency. No straining to urinate.  No flank pain.  Musculoskeletal:  No joint pain or no joint swelling. No decreased range of motion. No back pain.  Psych:  No change in mood or affect. No  depression or anxiety. No memory loss.  Neuro: no localizing neurological complaints, no tingling, no weakness, no double vision, no gait abnormality, no slurred speech, no confusion  All systems reviewed and apart from [place] all are negative  Past Medical History:   Past Medical History:  Diagnosis Date   Hyperlipidemia    Hypertension    Obesity      Past Surgical History:  Procedure Laterality Date   CHOLECYSTECTOMY     RHINOPLASTY      Social History:  Ambulatory   independently     reports that she has quit smoking. She has never used smokeless tobacco. She reports current alcohol use. She reports previous drug use.   Family History:   Family History  Problem Relation Age of Onset   Hypertension Mother    Hypertension Father     Allergies: Allergies  Allergen Reactions   Bee Venom Anaphylaxis   Ciprofloxacin Itching   Lisinopril     Cough      Prior to Admission medications   Medication Sig Start Date End Date Taking? Authorizing Provider  ibuprofen (ADVIL,MOTRIN) 200 MG tablet Take 200 mg by mouth every 6 (six) hours as needed.    [provider]  losartan (COZAAR) 25 MG tablet Take 1 tablet (25 mg total) by mouth daily. 10/12/12   Lucille Passy, MD  Multiple Vitamins-Minerals (ADULT GUMMY PO) Take one by mouth daily    [provider]   Physical Exam: Vitals with BMI 04/29/2020 04/29/2020 04/29/2020  Height - - -  Weight - - -  BMI - - -  Systolic 497 026 378  Diastolic 588 502 57  Pulse 117 119 31    1. General:  In  increased work of breathing  toxic acutely ill-appearing 2. Psychological: Alert and   Oriented to self 3. Head/ENT:      Dry Mucous Membranes                          Head Non traumatic, neck supple                            Poor Dentition 4. SKIN:  decreased Skin turgor,  Skin clean Dry Perianal abcsess     5. Heart: Regular rate and rhythm no Murmur, no Rub or gallop 6. Lungs no wheezes or crackles   Rapid resp 7. Abdomen: Soft,  non-tender, Non distended obese  bowel sounds present 8. Lower extremities: no clubbing, cyanosis, no  edema 9. Neurologically Grossly intact, moving all 4 extremities equally  10. MSK: Normal range of motion   All other LABS:       Recent Labs  Lab 04/29/20 1411  WBC PENDING   35.3*  HGB 16.0*  16.0*  HCT 47.9*   47.2*  MCV 93.7   92.0  PLT 608*   594*     Recent Labs  Lab 04/29/20 1725  NA 134*  K 3.4*  CL 106  CO2 <7*  GLUCOSE 527*  BUN 43*  CREATININE 1.37*  CALCIUM 8.1*     No results for input(s): AST, ALT, ALKPHOS, BILITOT, PROT, ALBUMIN in the last 168 hours.     Cultures: No results found for: SDES, [place], CULT, REPTSTATUS   Radiological Exams on Admission: DG Chest Portable 1 View  Result Date: 04/29/2020 CLINICAL DATA:  Tachypnea.  Shortness of breath.  Diabetes. EXAM: PORTABLE CHEST 1 VIEW COMPARISON:  None. FINDINGS: Artifact overlies the chest. Heart size is normal. Aortic atherosclerotic calcification. The pulmonary vascularity is normal. The lungs are clear. No effusions. No acute bone finding. IMPRESSION: No active disease. Aortic atherosclerotic calcification. Electronically Signed   By: Nelson Chimes M.D.   On: 04/29/2020 15:49    Chart has been reviewed    Assessment/Plan   64 y.o. female with medical history significant of HTN, obesity. HLD     Admitted for DKA in the setting of new diagnosis of diabetes with dehydration and AKI  Present on Admission:  DKA (diabetic ketoacidosis) ([place]) - will admit per DKA  protocol, obtain serial BMET, start on glucosestabalizer, aggressive IVF.   Change IVF to D5 1/2Na after BG <250  At this point unclear if type 1 or type 2 DKA.    work up  possible causes of DKA/HSS with CXR (WNL) , ECG (non ischemic) one set of cardiac enzymes, UA.  Cause Most likely new onset DM Monitor in Stepdown. Replace potassium as needed.    Consult diabetes coordinator    Hypertension - hold ACei or ARB given AKI   Obesity - chronic would benefit from nutrition consult   HLD (hyperlipidemia) - not on statin will check lipid panel   AKI (acute kidney injury) ([place]) - due to dehydration due to DKA wil rehydrate check urine electrolytes, avoid nephrotoxic meds   Dehydration - will rehydrate   Metabolic acidosis due to diabetes mellitus (HCC) - due to DKA, admit to stepdown , initiate IVF luids and Insulin drip, given 1 amp of bicarb in ER  SepSIS (systemic inflammatory response syndrome) (HCC) -   -SIRS criteria met with  elevated white blood cell count,       Component Value Date/Time   WBC 35.3 (H) 04/29/2020 1411   WBC PENDING 04/29/2020 1411     tachycardia   ,  Hypothermia  RR >20 Today's Vitals   04/29/20 1500 04/29/20 1518 04/29/20 1742 04/29/20 1745  BP: (!) 162/57  (!) 157/144 (!) 157/114  Pulse: (!) 31  (!) 119 (!) 117  Resp: (!) 31  (!) 26 (!) 28  Temp:  (!) 96 F (35.6 C)    TempSrc:  Rectal    SpO2:   99% 100%  Weight:      Height:      PainSc:   0-No pain     This patient meets SIRS Criteria    SIRS criteria were due to ( dehydration/DKA  ) At this Time no source of infection and SEPSIS IS RULED OUT 8:02 PM  PLEASE SEE BELOW  Was notified that patient has perirectal swelling Patient was reexamined. ER provider discussed with radiology who agreed they can see perirectal air and large area of stranding  Consulted Dr. Dahlia Byes Will see Pt early in AM,  plan for I/D, unlikely to be Necrotizing fascitis at this time as per Gen Surgery Will do broad spectrum ABX start Zosyn/Vanc and Clinda  Rehydrate Keep NPO Blood cultures obtained  Patient at this time meets sepsis criteria 10:46 PM  Cellulitis abscess perianal- -admit per cellulitis protocol will     Initiated broad-spectrum antibiotics Zosyn Vanco and Clinda     CT showed:   evidence of air  no evidence of osteomyelitis   No  foreign   Objects General surgery consulted        Will obtain MRSA screening,      obtain blood cultures       further antibiotic adjustment pending above results    Elevated troponin -unclear if has any prior history of CAD EKG without any evidence of acute ischemic changes patient is chest pain-free, significant troponin elevation but in the setting of AKI severe DKA and sepsis most likely secondary to demand ischemia although further cardiac  evaluation would be helpful. We will continue to cycle cardiac enzymes obtain echogram and notify cardiology   Other plan as per orders.  DVT prophylaxis:  SCD       Code Status:    Code Status: Not on file FULL CODE as per patient   I had personally discussed CODE STATUS with patient     Family Communication:   Family   at  Bedside  plan of care was discussed   with   Son,   Disposition Plan:    To home once workup is complete and patient is stable   Following barriers for discharge:                            Electrolytes corrected                              DKA corrected white count improving                              Will need to be able to tolerate PO                                                    Would benefit from PT/OT eval prior to DC  Ordered                                     Diabetes care coordinator                   Transition of care consulted                   Nutrition    consulted                                       Consults called: NONE  Admission status:  ED Disposition    ED Disposition Condition [place]: [place] [[id number]]  Level of Care: Stepdown [14]  Covid Evaluation: Confirmed COVID Negative  Diagnosis: DKA (  diabetic ketoacidosis) [facility]) [[id number]]  Admitting Physician: Toy Baker [[id number]]  Attending Physician: Toy Baker [[id number]]     Obs    Level of care  stepdown  tele indefinitely please discontinue once patient no longer qualifies COVID-19 Labs    Lab Results  Component  Value Date   [place] NEGATIVE 04/29/2020    Precautions: admitted as  Covid Negative    PPE: Used by the provider:   P100  eye Goggles,  Gloves   Nadeem Romanoski 04/29/2020, 10:50 PM    [facility]     after 2 AM please page floor coverage PA If 7AM-7PM, please contact the day team taking care of the patient using [email or web address]   Patient was evaluated in the context of the global COVID-19 pandemic, which necessitated consideration that the patient might be at risk for infection with the SARS-CoV-2 virus that causes COVID-19. Institutional protocols and algorithms that pertain to the evaluation of patients at risk for COVID-19 are in a state of rapid change based on information released by regulatory bodies including the CDC and federal and state organizations. These policies and algorithms were followed during the patient's care.

## 2020-04-29 NOTE — ED Notes (Addendum)
Patient placed on pure wick °

## 2020-04-29 NOTE — ED Notes (Signed)
Patient to CT scan at this time

## 2020-04-29 NOTE — ED Notes (Signed)
Insulin verified by Selena Batten, RN.

## 2020-04-29 NOTE — ED Notes (Signed)
Patient confused and trying to get out of bed. Patient pulled IV out of arm while trying to get up. While getting patient back into bed, abscess found on patient's right buttock. Redness, swelling, and drainage noted. Admitting doc and ER doc aware of findings.

## 2020-04-29 NOTE — ED Provider Notes (Addendum)
[facility] Emergency Department Provider Note ____________________________________________   First MD Initiated Contact with Patient 04/29/20 1454     (approximate)  I have reviewed the triage vital signs and the nursing notes.  HISTORY  Chief Complaint Nausea (w/ vomiting, excessive thirst x past few days. Blood sugar 404 no known diabetes)   HPI Michelle Stark is a 64 y.o. femalewho presents to the ED for evaluation of nausea, vomiting and hyperglycemia.  Chart review indicates history of obesity, HTN and HLD.  No visits in our system for the past 5 years.  No documented history of diabetes.  Patient reports "feeling a kidney stone" on the left side last week over the course of 4 days with associated LLQ pain radiating to her left groin, nausea and vomiting nonbloody nonbilious emesis.  She reports passing the stone 4 days ago and has had no pain since then.  She reports persistent nausea since then, feeling "just so weak," and with polyuria/polydipsia.  She reports her son checking on her today, and forcing her to come to the ED for evaluation.  She reports feeling dysuria in the setting of her LLQ pain, but this has since passed.   Patient denies any fevers, syncope, chest pain, abdominal pain or dysuria for the past 4 days, flank pain.  Patient reports ambulating to the at her baseline, denies recent falls or trauma.    Past Medical History:  Diagnosis Date  . Hyperlipidemia   . Hypertension   . Obesity     Patient Active Problem List   Diagnosis Date Noted  . DKA (diabetic ketoacidosis) (HCC) 04/29/2020  . AKI (acute kidney injury) (HCC) 04/29/2020  . Dehydration 04/29/2020  . Metabolic acidosis due to diabetes mellitus (HCC) 04/29/2020  . SIRS (systemic inflammatory response syndrome) (HCC) 04/29/2020  . HLD (hyperlipidemia) 07/17/2012  . Hypertension   . Obesity   . Hyperlipidemia     Past Surgical History:  Procedure Laterality Date   . CHOLECYSTECTOMY    . RHINOPLASTY      Prior to Admission medications   Medication Sig Start Date End Date Taking? Authorizing Provider  ibuprofen (ADVIL,MOTRIN) 200 MG tablet Take 200 mg by mouth every 6 (six) hours as needed.    [provider]  losartan (COZAAR) 25 MG tablet Take 1 tablet (25 mg total) by mouth daily. 10/12/12   Dianne DunAron, Talia M, MD  Multiple Vitamins-Minerals (ADULT GUMMY PO) Take one by mouth daily    [provider]    Allergies Bee venom, Ciprofloxacin, and Lisinopril  Family History  Problem Relation Age of Onset  . Hypertension Mother   . Hypertension Father     Social History Social History   Tobacco Use  . Smoking status: Former Games developermoker  . Smokeless tobacco: Never Used  Substance Use Topics  . Alcohol use: Yes    Comment: occ  . Drug use: Not Currently    Review of Systems  Constitutional: No fever/chills.  Positive generalized weakness. Eyes: No visual changes. ENT: No sore throat. Cardiovascular: Denies chest pain. Respiratory: Denies shortness of breath. Gastrointestinal: No abdominal pain.  No nausea, no vomiting.  No diarrhea.  No constipation. Genitourinary: Positive for dysuria, polyuria and polydipsia. Musculoskeletal: Negative for back pain. Skin: Negative for rash. Neurological: Negative for headaches, focal weakness or numbness.  ____________________________________________   PHYSICAL EXAM:  VITAL SIGNS: Vitals:   04/29/20 1742 04/29/20 1745  BP: (!) 157/144 (!) 157/114  Pulse: (!) 119 (!) 117  Resp: Marland Kitchen(!)  26 (!) 28  Temp:    SpO2: 99% 100%      Constitutional: Obese, supine in bed and acutely ill-appearing.  Obviously tachypneic and dyspneic, speaking in short phrases only.  Follows commands all 4 extremities.  Disoriented to time and situation, oriented to self and place. Eyes: Conjunctivae are normal. PERRL. EOMI. Head: Atraumatic. Nose: No congestion/rhinnorhea. Mouth/Throat: Mucous membranes are  dry.  Oropharynx non-erythematous. Neck: No stridor. No cervical spine tenderness to palpation. Cardiovascular: Tachycardic, regular rhythm. Grossly normal heart sounds.  Good peripheral circulation. Respiratory: Tachypneic to the low 30s no retractions. Lungs CTAB. Gastrointestinal: Soft , nondistended, nontender to palpation. No abdominal bruits. No CVA tenderness.  Benign abdomen. Musculoskeletal: No lower extremity tenderness nor edema.  No joint effusions. No signs of acute trauma. Neurologic: Nonfocal exam without evidence of focal deficits.  GCS 13. Skin:  Skin is warm, dry and intact. No rash noted. Psychiatric: Mood and affect are appropriate.  ____________________________________________   LABS (all labs ordered are listed, but only abnormal results are displayed)  Labs Reviewed  CBC - Abnormal; Notable for the following components:      Result Value   WBC 35.3 (*)    RBC 5.13 (*)    Hemoglobin 16.0 (*)    HCT 47.2 (*)    Platelets 594 (*)    All other components within normal limits  BETA-HYDROXYBUTYRIC ACID - Abnormal; Notable for the following components:   Beta-Hydroxybutyric Acid 7.45 (*)    All other components within normal limits  BASIC METABOLIC PANEL - Abnormal; Notable for the following components:   Sodium 134 (*)    Potassium 3.4 (*)    CO2 <7 (*)    Glucose, Bld 527 (*)    BUN 43 (*)    Creatinine, Ser 1.37 (*)    Calcium 8.1 (*)    GFR, Estimated 41 (*)    All other components within normal limits  BLOOD GAS, VENOUS - Abnormal; Notable for the following components:   pH, Ven 7.00 (*)    pCO2, Ven 20 (*)    pO2, Ven 50.0 (*)    Bicarbonate 4.9 (*)    Acid-base deficit 24.9 (*)    All other components within normal limits  CBC - Abnormal; Notable for the following components:   Hemoglobin 16.0 (*)    HCT 47.9 (*)    Platelets 608 (*)    All other components within normal limits  GLUCOSE, CAPILLARY - Abnormal; Notable for the following  components:   Glucose-Capillary >600 (*)    All other components within normal limits  GLUCOSE, CAPILLARY - Abnormal; Notable for the following components:   Glucose-Capillary 589 (*)    All other components within normal limits  RESPIRATORY PANEL BY RT PCR (FLU A&B, COVID)  CULTURE, BLOOD (ROUTINE X 2)  CULTURE, BLOOD (ROUTINE X 2)  URINE CULTURE  URINALYSIS, COMPLETE (UACMP) WITH MICROSCOPIC  DIFFERENTIAL  URINALYSIS, ROUTINE W REFLEX MICROSCOPIC  MAGNESIUM  PHOSPHORUS  LACTIC ACID, PLASMA  LACTIC ACID, PLASMA  LACTIC ACID, PLASMA  PROTIME-INR  URINE DRUG SCREEN, QUALITATIVE ([facility] ONLY)  LIPASE, BLOOD  C-PEPTIDE  CREATININE, URINE, RANDOM  OSMOLALITY, URINE  SODIUM, URINE, RANDOM  ETHANOL  CK  PROCALCITONIN  OSMOLALITY  CBG MONITORING, ED  CBG MONITORING, ED  TROPONIN I (HIGH SENSITIVITY)   ____________________________________________  12 Lead EKG  Sinus rhythm, rate of 128 bpm with sinus arrhythmia.  Left axis.  Poor R wave progression.  No evidence of acute ischemia. ____________________________________________  RADIOLOGY  ED MD interpretation: 1 view CXR reviewed by me without evidence of acute cardiopulmonary pathology.  CT abdomen/pelvis reviewed by me without evidence of perinephric abscess, perinephric stranding or acute intra abdominal infectious pathology.  Official radiology report(s): CT ABDOMEN PELVIS W CONTRAST  Result Date: 04/29/2020 CLINICAL DATA:  Acute nonlocalized abdominal pain. Recent passage of left renal stone. Diabetic ketoacidosis with leukocytosis and hypothermia. EXAM: CT ABDOMEN AND PELVIS WITH CONTRAST TECHNIQUE: Multidetector CT imaging of the abdomen and pelvis was performed using the standard protocol following bolus administration of intravenous contrast. CONTRAST:  24mL OMNIPAQUE IOHEXOL 300 MG/ML  SOLN COMPARISON:  None. FINDINGS: Lower chest: Lung bases are clear.  No pleural or pericardial fluid. Hepatobiliary: Liver parenchyma  is normal. Previous cholecystectomy. Pancreas: Normal Spleen: Normal Adrenals/Urinary Tract: Adrenal glands are normal. Kidneys are normal. No cyst, mass, stone or hydronephrosis. No stone along the course of either ureter. There is phlebolith or a small left ovarian calcification at the left pelvic brim adjacent to the ureter. No stone in the bladder. Stomach/Bowel: Stomach, small intestine and colon appear unremarkable. Vascular/Lymphatic: Aortic atherosclerosis. No aneurysm. IVC is normal. No retroperitoneal adenopathy. Reproductive: No pelvic mass. Uterus and adnexal regions appear normal. Other: No free fluid or air. Musculoskeletal: Lower lumbar degenerative changes with a degree of stenosis at L4-5. IMPRESSION: 1. No acute finding. No evidence of urinary tract stone disease or other urinary tract pathology. Phlebolith or small left ovarian calcification at the left pelvic brim is adjacent to the ureter and simulates a ureteral stone. 2. Previous cholecystectomy. 3. 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 4. Aortic atherosclerosis. Aortic Atherosclerosis (ICD10-I70.0). Electronically Signed   By: Paulina Fusi M.D.   On: 04/29/2020 19:10   DG Chest Portable 1 View  Result Date: 04/29/2020 CLINICAL DATA:  Tachypnea.  Shortness of breath.  Diabetes. EXAM: PORTABLE CHEST 1 VIEW COMPARISON:  None. FINDINGS: Artifact overlies the chest. Heart size is normal. Aortic atherosclerotic calcification. The pulmonary vascularity is normal. The lungs are clear. No effusions. No acute bone finding. IMPRESSION: No active disease. Aortic atherosclerotic calcification. Electronically Signed   By: Paulina Fusi M.D.   On: 04/29/2020 15:49    ____________________________________________   PROCEDURES and INTERVENTIONS  Procedure(s) performed (including Critical Care):  .1-3 Lead EKG Interpretation Performed by: Delton Prairie, MD Authorized by: Delton Prairie, MD     Interpretation:  abnormal     ECG rate:  110   ECG rate assessment: tachycardic     Rhythm: sinus tachycardia     Ectopy: none     Conduction: normal   .Critical Care Performed by: Delton Prairie, MD Authorized by: Delton Prairie, MD   Critical care provider statement:    Critical care time (minutes):  55   Critical care was necessary to treat or prevent imminent or life-threatening deterioration of the following conditions:  Endocrine crisis and metabolic crisis   Critical care was time spent personally by me on the following activities:  Discussions with consultants, evaluation of patient's response to treatment, examination of patient, ordering and performing treatments and interventions, ordering and review of laboratory studies, ordering and review of radiographic studies, pulse oximetry, re-evaluation of patient's condition, obtaining history from patient or surrogate and review of old charts    Medications  insulin regular, human (MYXREDLIN) 100 units/ 100 mL infusion (has no administration in time range)  lactated ringers infusion ( Intravenous New Bag/Given 04/29/20 1939)  dextrose 5 % in lactated ringers infusion (has  no administration in time range)  dextrose 50 % solution 0-50 mL (has no administration in time range)  potassium chloride 10 mEq in 100 mL IVPB (10 mEq Intravenous New Bag/Given 04/29/20 1936)  sodium bicarbonate injection 50 mEq (has no administration in time range)  sodium chloride 0.9 % bolus 1,000 mL (1,000 mLs Intravenous Bolus from Bag 04/29/20 1519)  lactated ringers bolus 1,000 mL (1,000 mLs Intravenous New Bag/Given 04/29/20 1737)  iohexol (OMNIPAQUE) 300 MG/ML solution 75 mL (75 mLs Intravenous Contrast Given 04/29/20 1846)    ____________________________________________   MDM / ED COURSE  64 year old woman with history of HTN and obesity, but no history of DM, presents with evidence of DKA of uncertain etiology and requiring medical admission.  Patient with a rectal  temperature of 96 F, tachycardic but hemodynamically stable.  She appears unwell with [place] respirations, tachypnea and slightly depressed mental status with a GCS of about 13.  POC glucose elevated with EMS and her clinical picture is most consistent with DKA despite having no history of such.  Difficulty getting blood work due to hemolysis, and eventually demonstrating signs of severe DKA.  Due to pH of 7.00, patient was provided one amp of bicarbonate.  She was fluid resuscitated with 2 L IVF prior to initiation of insulin drip and potassium per protocol.  CT abdomen without evidence of intra-abdominal infectious or other acute pathology.  I am uncertain what precipitated her DKA, and it is unusual for 64 year old woman with no history of diabetes to present with new onset DKA.  She is severely dehydrated and her CBC changes are likely hemoconcentration, but due to her tachycardia, leukocytosis and hypothermia blood cultures were drawn.  Due to no evidence of acute infectious pathology on my work-up or examination, elected to abstain from antibiotic deployment in the ED.  I discussed this with the admitting hospitalist, who agrees.  Will admit to hospitalist medicine for further work-up and management.  Clinical Course as of Apr 29 1940  Tue Apr 29, 2020  1617 Hemolyzed blood work required repeat.  These are now in process.  Awaiting chemistry to assess potassium and acid-base balance.  High suspicion for DKA.   [DS]  1705 BMP cancelled again? I call the lab and speak with lab technician who indicates both samples are hemolyzed and require repeat draws. I ask for any values that he can give me:  BUN 44 Ca9.5 Cl98 Co <7 Cr 1.94 No result for Na, K, Glu Beta hydroxy no result, recollect   [DS]  1730 Reassessed.  RN drawing repeat blood work for the third time.  Son is at the bedside and educated the son about my suspicion for DKA.  We talked about diabetes, need for insulin and hyperglycemia.   Answered questions.   [DS]  1835 Finally have some blood work.  Leukocytosis to 35,000, will add on differential.  With her having abdominal pain symptoms of ureterolithiasis last week, her rectal hypothermia and this leukocytosis I am concerned of the possibility of an acute infectious etiology that is ongoing such as a perinephric abscess or structural stone.  She is yet to provide [place] a urine sample.  We will CT her abdomen/pelvis to assess for these infectious etiologies.  Will initiate insulin drip and potassium repletion per protocol.   [DS]    Clinical Course User Index [DS] Delton Prairie, MD    Addendum:  10:00 PM after patient has been admitted and bedding in the ED, and while I am in  another patient's room, bedside RN grabs me after finding a wound to the patient's bottom while turning her.  Patient had no symptoms of this on presentation and I did not notice this on my initial evaluation.  Immediately return to the bedside and noticed an acutely infected actively draining wound to the patient's right perianal area.  Actively discharging dishwater fluid with purulent pearls within this.  I assess the extent of this wound at the bedside, thank RN for what she has found and ordered broad-spectrum antibiotics immediately.  I then review CT images that do partially visualize a gas-producing subcutaneous wound concerning for necrotizing soft tissue infection.  I called [facility] radiology group and ask for radiologist to re-review of these images to comment on these findings.  I speak with radiologist on-call who agrees that it appears these findings are missed on their initial read.  Addendum noted to their read.  I then messaged admitting hospitalist on epic secure chat elaborating on what we have found, and to discuss need for surgical evaluation.  ____________________________________________   FINAL CLINICAL IMPRESSION(S) / ED DIAGNOSES  Final diagnoses:  Diabetic ketoacidosis without coma  associated with type 2 diabetes mellitus (HCC)  Hypokalemia  Dehydration  Sinus tachycardia     ED Discharge Orders    None       Lener Ventresca   Note:  This document was prepared using Conservation officer, historic buildings and may include unintentional dictation errors.   Delton Prairie, MD 04/29/20 1946    Delton Prairie, MD 05/02/20 [phone]

## 2020-04-29 NOTE — ED Notes (Signed)
Date and time results received: 04/29/20 1821 (use smartphrase ".now" to insert current time)  Test: Glucose Critical Value: 527  Name of Provider Notified: Katrinka Blazing, MD  Orders Received? Or Actions Taken?: Orders Received - See Orders for details

## 2020-04-29 NOTE — ED Triage Notes (Signed)
Patient comes in via EMS after N/V and excessive thirst for the "past several days." No known diabetes but blood sugar 404 per EMS.

## 2020-04-29 NOTE — ED Notes (Signed)
Date and time results received: 04/29/20 2117 (use smartphrase ".now" to insert current time)  Test: Osmolarity Critical Value: 337  Name of Provider Notified: Adela Glimpse, MD  Orders Received? Or Actions Taken?: Orders Received - See Orders for details

## 2020-04-29 NOTE — Progress Notes (Signed)
Case d/w Dr. Kara Pacer. Sepsis from perirectal abscess and DKA . CT scan personally reviewed and d/w Radiologist; c/w w perirectal abscess. Small locules of air c/w abscess, not classic for necrotizing infection, no evidence of extensive sub q air to suggest necrotizing infection. We will plan for I/D formally in the OR in am. Needs aggressive fluid resuscitation

## 2020-04-29 NOTE — Consult Note (Signed)
Pharmacy Antibiotic Note  Michelle Stark is a 64 y.o. female admitted on 04/29/2020 with sepsis and cellulitis.  Pharmacy has been consulted for vancomycin and pip/tazo dosing.  Plan: Zosyn 3.375g IV q8h (4 hour infusion).   Pt was given vancomycin 1250 mg x 1 in the ED. Will order vancomycin 750 mg x 1 to make it a total of 2000 mg loading dose followed by 750 mg q12H maintenance dose. Plan to order vancomycin level prior to the 4th or 5th dose. Goal trough 15-20.   Discussed with MD and surgery is concern for necrotizing fasciitis started on clindamycin 900 mg q8H.   Height: 5\' 2"  (157.5 cm) Weight: 82.6 kg (182 lb) IBW/kg (Calculated) : 50.1  Temp (24hrs), Avg:96 F (35.6 C), Min:96 F (35.6 C), Max:96 F (35.6 C)  Recent Labs  Lab 04/29/20 1411 04/29/20 1725 04/29/20 1947  WBC 35.6*  35.3*  --   --   CREATININE  --  1.37*  --   LATICACIDVEN  --   --  2.0*    Estimated Creatinine Clearance: 41.3 mL/min (A) (by C-G formula based on SCr of 1.37 mg/dL (H)).    Allergies  Allergen Reactions  . Bee Venom Anaphylaxis  . Ciprofloxacin Itching  . Lisinopril     Cough     Antimicrobials this admission: 10/19 pip/tazo >>  10/19 vancomycin >>  10/19 clindamycin >>   Dose adjustments this admission: None  Microbiology results: 10/19 BCx: pending 10/19 UCx: pending   Thank you for allowing pharmacy to be a part of this patient's care.  11/19, PharmD, BCPS 04/29/2020 10:09 PM

## 2020-04-30 ENCOUNTER — Observation Stay: Payer: No Typology Code available for payment source | Admitting: Anesthesiology

## 2020-04-30 ENCOUNTER — Encounter
Admission: EM | Disposition: A | Discharge status: Home Health | Payer: Self-pay | Source: Home / Self Care | Attending: Internal Medicine

## 2020-04-30 ENCOUNTER — Inpatient Hospital Stay (HOSPITAL_COMMUNITY)
Admit: 2020-04-30 | Discharge: 2020-04-30 | Disposition: A | Discharge status: Home / Self Care | Payer: No Typology Code available for payment source | Attending: Cardiology | Admitting: Cardiology

## 2020-04-30 DIAGNOSIS — E860 Dehydration: Secondary | ICD-10-CM | POA: Diagnosis present

## 2020-04-30 DIAGNOSIS — E876 Hypokalemia: Secondary | ICD-10-CM | POA: Diagnosis present

## 2020-04-30 DIAGNOSIS — E1110 Type 2 diabetes mellitus with ketoacidosis without coma: Secondary | ICD-10-CM

## 2020-04-30 DIAGNOSIS — R7989 Other specified abnormal findings of blood chemistry: Secondary | ICD-10-CM

## 2020-04-30 DIAGNOSIS — E785 Hyperlipidemia, unspecified: Secondary | ICD-10-CM | POA: Diagnosis present

## 2020-04-30 DIAGNOSIS — B9620 Unspecified Escherichia coli [E. coli] as the cause of diseases classified elsewhere: Secondary | ICD-10-CM

## 2020-04-30 DIAGNOSIS — M726 Necrotizing fasciitis: Secondary | ICD-10-CM | POA: Diagnosis present

## 2020-04-30 DIAGNOSIS — R778 Other specified abnormalities of plasma proteins: Secondary | ICD-10-CM

## 2020-04-30 DIAGNOSIS — A419 Sepsis, unspecified organism: Secondary | ICD-10-CM | POA: Diagnosis present

## 2020-04-30 DIAGNOSIS — K611 Rectal abscess: Secondary | ICD-10-CM | POA: Diagnosis present

## 2020-04-30 DIAGNOSIS — E119 Type 2 diabetes mellitus without complications: Secondary | ICD-10-CM

## 2020-04-30 DIAGNOSIS — D72829 Elevated white blood cell count, unspecified: Secondary | ICD-10-CM

## 2020-04-30 DIAGNOSIS — E7800 Pure hypercholesterolemia, unspecified: Secondary | ICD-10-CM

## 2020-04-30 DIAGNOSIS — N179 Acute kidney failure, unspecified: Secondary | ICD-10-CM

## 2020-04-30 DIAGNOSIS — D75838 Other thrombocytosis: Secondary | ICD-10-CM | POA: Diagnosis present

## 2020-04-30 DIAGNOSIS — L0231 Cutaneous abscess of buttock: Secondary | ICD-10-CM | POA: Diagnosis present

## 2020-04-30 DIAGNOSIS — M728 Other fibroblastic disorders: Secondary | ICD-10-CM

## 2020-04-30 DIAGNOSIS — G9341 Metabolic encephalopathy: Secondary | ICD-10-CM | POA: Diagnosis present

## 2020-04-30 DIAGNOSIS — I10 Essential (primary) hypertension: Secondary | ICD-10-CM

## 2020-04-30 DIAGNOSIS — L89153 Pressure ulcer of sacral region, stage 3: Secondary | ICD-10-CM | POA: Diagnosis present

## 2020-04-30 DIAGNOSIS — E781 Pure hyperglyceridemia: Secondary | ICD-10-CM | POA: Diagnosis present

## 2020-04-30 DIAGNOSIS — Z79899 Other long term (current) drug therapy: Secondary | ICD-10-CM | POA: Diagnosis not present

## 2020-04-30 DIAGNOSIS — A401 Sepsis due to streptococcus, group B: Secondary | ICD-10-CM | POA: Diagnosis present

## 2020-04-30 DIAGNOSIS — L03317 Cellulitis of buttock: Secondary | ICD-10-CM | POA: Diagnosis present

## 2020-04-30 DIAGNOSIS — Z8249 Family history of ischemic heart disease and other diseases of the circulatory system: Secondary | ICD-10-CM | POA: Diagnosis not present

## 2020-04-30 DIAGNOSIS — Z20822 Contact with and (suspected) exposure to covid-19: Secondary | ICD-10-CM | POA: Diagnosis present

## 2020-04-30 DIAGNOSIS — L97229 Non-pressure chronic ulcer of left calf with unspecified severity: Secondary | ICD-10-CM | POA: Diagnosis present

## 2020-04-30 DIAGNOSIS — L02215 Cutaneous abscess of perineum: Secondary | ICD-10-CM | POA: Diagnosis present

## 2020-04-30 DIAGNOSIS — I21A1 Myocardial infarction type 2: Secondary | ICD-10-CM | POA: Diagnosis present

## 2020-04-30 DIAGNOSIS — I248 Other forms of acute ischemic heart disease: Secondary | ICD-10-CM

## 2020-04-30 DIAGNOSIS — Z6841 Body Mass Index (BMI) 40.0 and over, adult: Secondary | ICD-10-CM | POA: Diagnosis not present

## 2020-04-30 HISTORY — PX: INCISION AND DRAINAGE ABSCESS: SHX5864

## 2020-04-30 LAB — GLUCOSE, CAPILLARY
Glucose-Capillary: 130 mg/dL — ABNORMAL HIGH (ref 70–99)
Glucose-Capillary: 152 mg/dL — ABNORMAL HIGH (ref 70–99)
Glucose-Capillary: 164 mg/dL — ABNORMAL HIGH (ref 70–99)
Glucose-Capillary: 169 mg/dL — ABNORMAL HIGH (ref 70–99)
Glucose-Capillary: 177 mg/dL — ABNORMAL HIGH (ref 70–99)
Glucose-Capillary: 182 mg/dL — ABNORMAL HIGH (ref 70–99)
Glucose-Capillary: 183 mg/dL — ABNORMAL HIGH (ref 70–99)
Glucose-Capillary: 184 mg/dL — ABNORMAL HIGH (ref 70–99)
Glucose-Capillary: 192 mg/dL — ABNORMAL HIGH (ref 70–99)
Glucose-Capillary: 195 mg/dL — ABNORMAL HIGH (ref 70–99)
Glucose-Capillary: 195 mg/dL — ABNORMAL HIGH (ref 70–99)
Glucose-Capillary: 206 mg/dL — ABNORMAL HIGH (ref 70–99)
Glucose-Capillary: 206 mg/dL — ABNORMAL HIGH (ref 70–99)
Glucose-Capillary: 208 mg/dL — ABNORMAL HIGH (ref 70–99)
Glucose-Capillary: 212 mg/dL — ABNORMAL HIGH (ref 70–99)
Glucose-Capillary: 223 mg/dL — ABNORMAL HIGH (ref 70–99)
Glucose-Capillary: 228 mg/dL — ABNORMAL HIGH (ref 70–99)
Glucose-Capillary: 233 mg/dL — ABNORMAL HIGH (ref 70–99)
Glucose-Capillary: 269 mg/dL — ABNORMAL HIGH (ref 70–99)
Glucose-Capillary: 278 mg/dL — ABNORMAL HIGH (ref 70–99)
Glucose-Capillary: 307 mg/dL — ABNORMAL HIGH (ref 70–99)

## 2020-04-30 LAB — BASIC METABOLIC PANEL
Anion gap: 14 (ref 5–15)
Anion gap: 11 (ref 5–15)
Anion gap: 12 (ref 5–15)
Anion gap: 13 (ref 5–15)
Anion gap: 11 (ref 5–15)
BUN: 42 mg/dL — ABNORMAL HIGH (ref 8–23)
BUN: 40 mg/dL — ABNORMAL HIGH (ref 8–23)
BUN: 39 mg/dL — ABNORMAL HIGH (ref 8–23)
BUN: 39 mg/dL — ABNORMAL HIGH (ref 8–23)
BUN: 38 mg/dL — ABNORMAL HIGH (ref 8–23)
CO2: 12 mmol/L — ABNORMAL LOW (ref 22–32)
CO2: 16 mmol/L — ABNORMAL LOW (ref 22–32)
CO2: 14 mmol/L — ABNORMAL LOW (ref 22–32)
CO2: 14 mmol/L — ABNORMAL LOW (ref 22–32)
CO2: 16 mmol/L — ABNORMAL LOW (ref 22–32)
Calcium: 8.7 mg/dL — ABNORMAL LOW (ref 8.9–10.3)
Calcium: 8.6 mg/dL — ABNORMAL LOW (ref 8.9–10.3)
Calcium: 8.6 mg/dL — ABNORMAL LOW (ref 8.9–10.3)
Calcium: 8.7 mg/dL — ABNORMAL LOW (ref 8.9–10.3)
Calcium: 8.6 mg/dL — ABNORMAL LOW (ref 8.9–10.3)
Chloride: 112 mmol/L — ABNORMAL HIGH (ref 98–111)
Chloride: 112 mmol/L — ABNORMAL HIGH (ref 98–111)
Chloride: 112 mmol/L — ABNORMAL HIGH (ref 98–111)
Chloride: 114 mmol/L — ABNORMAL HIGH (ref 98–111)
Chloride: 115 mmol/L — ABNORMAL HIGH (ref 98–111)
Creatinine, Ser: 1.11 mg/dL — ABNORMAL HIGH (ref 0.44–1.00)
Creatinine, Ser: 1.05 mg/dL — ABNORMAL HIGH (ref 0.44–1.00)
Creatinine, Ser: 1.07 mg/dL — ABNORMAL HIGH (ref 0.44–1.00)
Creatinine, Ser: 1.02 mg/dL — ABNORMAL HIGH (ref 0.44–1.00)
Creatinine, Ser: 0.98 mg/dL (ref 0.44–1.00)
GFR, Estimated: 52 mL/min — ABNORMAL LOW (ref >60)
GFR, Estimated: 56 mL/min — ABNORMAL LOW (ref >60)
GFR, Estimated: 55 mL/min — ABNORMAL LOW (ref >60)
GFR, Estimated: 58 mL/min — ABNORMAL LOW (ref >60)
GFR, Estimated: >60 mL/min (ref >60)
Glucose, Bld: 275 mg/dL — ABNORMAL HIGH (ref 70–99)
Glucose, Bld: 275 mg/dL — ABNORMAL HIGH (ref 70–99)
Glucose, Bld: 227 mg/dL — ABNORMAL HIGH (ref 70–99)
Glucose, Bld: 156 mg/dL — ABNORMAL HIGH (ref 70–99)
Glucose, Bld: 202 mg/dL — ABNORMAL HIGH (ref 70–99)
Potassium: 3.4 mmol/L — ABNORMAL LOW (ref 3.5–5.1)
Potassium: 3 mmol/L — ABNORMAL LOW (ref 3.5–5.1)
Potassium: 2.8 mmol/L — ABNORMAL LOW (ref 3.5–5.1)
Potassium: 3 mmol/L — ABNORMAL LOW (ref 3.5–5.1)
Potassium: 2.9 mmol/L — ABNORMAL LOW (ref 3.5–5.1)
Sodium: 138 mmol/L (ref 135–145)
Sodium: 139 mmol/L (ref 135–145)
Sodium: 138 mmol/L (ref 135–145)
Sodium: 141 mmol/L (ref 135–145)
Sodium: 142 mmol/L (ref 135–145)

## 2020-04-30 LAB — TROPONIN I (HIGH SENSITIVITY)
Troponin I (High Sensitivity): 2734 ng/L (ref <18)
Troponin I (High Sensitivity): 3261 ng/L (ref <18)

## 2020-04-30 LAB — LIPID PANEL
Cholesterol: 266 mg/dL — ABNORMAL HIGH (ref 0–200)
HDL: 26 mg/dL — ABNORMAL LOW (ref >40)
LDL Cholesterol: 167 mg/dL — ABNORMAL HIGH (ref 0–99)
Total CHOL/HDL Ratio: 10.2 RATIO
Triglycerides: 367 mg/dL — ABNORMAL HIGH (ref <150)
VLDL: 73 mg/dL — ABNORMAL HIGH (ref 0–40)

## 2020-04-30 LAB — CBC
HCT: 36.6 % (ref 36.0–46.0)
Hemoglobin: 12.7 g/dL (ref 12.0–15.0)
MCH: 31.4 pg (ref 26.0–34.0)
MCHC: 34.7 g/dL (ref 30.0–36.0)
MCV: 90.4 fL (ref 80.0–100.0)
Platelets: 318 10*3/uL (ref 150–400)
RBC: 4.05 MIL/uL (ref 3.87–5.11)
RDW: 13.4 % (ref 11.5–15.5)
WBC: 16.3 10*3/uL — ABNORMAL HIGH (ref 4.0–10.5)
nRBC: 0 % (ref 0.0–0.2)

## 2020-04-30 LAB — HEPATIC FUNCTION PANEL
ALT: 27 U/L (ref 0–44)
AST: 41 U/L (ref 15–41)
Albumin: 2.4 g/dL — ABNORMAL LOW (ref 3.5–5.0)
Alkaline Phosphatase: 96 U/L (ref 38–126)
Bilirubin, Direct: <0.1 mg/dL (ref 0.0–0.2)
Total Bilirubin: 0.6 mg/dL (ref 0.3–1.2)
Total Protein: 6.2 g/dL — ABNORMAL LOW (ref 6.5–8.1)

## 2020-04-30 LAB — HIV ANTIBODY (ROUTINE TESTING W REFLEX): HIV Screen 4th Generation wRfx: NONREACTIVE

## 2020-04-30 LAB — LIPASE, BLOOD: Lipase: 90 U/L — ABNORMAL HIGH (ref 11–51)

## 2020-04-30 LAB — ECHOCARDIOGRAM COMPLETE
AR max vel: 2.86 cm2
AV Area VTI: 2.96 cm2
AV Area mean vel: 2.84 cm2
AV Mean grad: 5 mmHg
AV Peak grad: 8.2 mmHg
Ao pk vel: 1.43 m/s
Area-P 1/2: 3.87 cm2
Calc EF: 67.2 %
Height: 62 in
Single Plane A2C EF: 63.5 %
Single Plane A4C EF: 69.4 %
Weight: 3633.18 oz

## 2020-04-30 LAB — LACTIC ACID, PLASMA: Lactic Acid, Venous: 1.5 mmol/L (ref 0.5–1.9)

## 2020-04-30 LAB — BETA-HYDROXYBUTYRIC ACID
Beta-Hydroxybutyric Acid: 2.81 mmol/L — ABNORMAL HIGH (ref 0.05–0.27)
Beta-Hydroxybutyric Acid: 0.6 mmol/L — ABNORMAL HIGH (ref 0.05–0.27)

## 2020-04-30 LAB — MRSA PCR SCREENING: MRSA by PCR: NEGATIVE

## 2020-04-30 LAB — PROCALCITONIN: Procalcitonin: 1.22 ng/mL

## 2020-04-30 LAB — MAGNESIUM: Magnesium: 2.2 mg/dL (ref 1.7–2.4)

## 2020-04-30 LAB — PHOSPHORUS: Phosphorus: 4.4 mg/dL (ref 2.5–4.6)

## 2020-04-30 SURGERY — INCISION AND DRAINAGE, ABSCESS
Anesthesia: General | Site: Perineum

## 2020-04-30 MED ORDER — PROPOFOL 10 MG/ML IV BOLUS
INTRAVENOUS | Status: DC | PRN
  Administered 2020-04-30: 200 mg via INTRAVENOUS

## 2020-04-30 MED ORDER — LACTATED RINGERS IV SOLN: INTRAVENOUS | Status: DC

## 2020-04-30 MED ORDER — PROPOFOL 10 MG/ML IV BOLUS
INTRAVENOUS | Status: AC
  Filled 2020-04-30: qty 20

## 2020-04-30 MED ORDER — ONDANSETRON HCL 4 MG/2ML IJ SOLN: 4.0000 mg | Freq: Once | INTRAMUSCULAR | Status: DC | PRN

## 2020-04-30 MED ORDER — NON FORMULARY: Freq: Once | Status: DC

## 2020-04-30 MED ORDER — KCL IN DEXTROSE-NACL 20-5-0.45 MEQ/L-%-% IV SOLN
INTRAVENOUS | Status: DC
  Filled 2020-04-30 (×6): qty 1000

## 2020-04-30 MED ORDER — LIDOCAINE HCL (CARDIAC) PF 100 MG/5ML IV SOSY
PREFILLED_SYRINGE | INTRAVENOUS | Status: DC | PRN
  Administered 2020-04-30: 100 mg via INTRAVENOUS

## 2020-04-30 MED ORDER — PHENYLEPHRINE HCL (PRESSORS) 10 MG/ML IV SOLN
INTRAVENOUS | Status: DC | PRN
  Administered 2020-04-30 (×4): 100 ug via INTRAVENOUS

## 2020-04-30 MED ORDER — ALBUMIN HUMAN 25 % IV SOLN: 12.5000 g | Freq: Once | INTRAVENOUS | Status: DC

## 2020-04-30 MED ORDER — ENOXAPARIN SODIUM 80 MG/0.8ML ~~LOC~~ SOLN
0.5000 mg/kg | SUBCUTANEOUS | Status: DC
  Administered 2020-04-30 – 2020-05-11 (×12): 52.5 mg via SUBCUTANEOUS
  Filled 2020-04-30 (×13): qty 0.8

## 2020-04-30 MED ORDER — POTASSIUM CHLORIDE 10 MEQ/100ML IV SOLN
10.0000 meq | INTRAVENOUS | Status: AC
  Administered 2020-04-30 – 2020-05-01 (×6): 10 meq via INTRAVENOUS
  Filled 2020-04-30 (×6): qty 100

## 2020-04-30 MED ORDER — ONDANSETRON HCL 4 MG/2ML IJ SOLN
INTRAMUSCULAR | Status: DC | PRN
  Administered 2020-04-30: 4 mg via INTRAVENOUS

## 2020-04-30 MED ORDER — SUCCINYLCHOLINE CHLORIDE 20 MG/ML IJ SOLN
INTRAMUSCULAR | Status: DC | PRN
  Administered 2020-04-30: 100 mg via INTRAVENOUS

## 2020-04-30 MED ORDER — SODIUM CHLORIDE 0.9 % IV SOLN: INTRAVENOUS | Status: DC | PRN

## 2020-04-30 MED ORDER — MIDAZOLAM HCL 2 MG/2ML IJ SOLN
INTRAMUSCULAR | Status: AC
  Filled 2020-04-30: qty 2

## 2020-04-30 MED ORDER — POTASSIUM CHLORIDE 10 MEQ/100ML IV SOLN
10.0000 meq | Freq: Once | INTRAVENOUS | Status: AC
  Administered 2020-04-30: 10 meq via INTRAVENOUS
  Filled 2020-04-30: qty 100

## 2020-04-30 MED ORDER — ENOXAPARIN SODIUM 60 MG/0.6ML ~~LOC~~ SOLN
0.5000 mg/kg | SUBCUTANEOUS | Status: DC
  Filled 2020-04-30 (×2): qty 0.6

## 2020-04-30 MED ORDER — SEVOFLURANE IN SOLN
RESPIRATORY_TRACT | Status: AC
  Filled 2020-04-30: qty 250

## 2020-04-30 MED ORDER — FENTANYL CITRATE (PF) 100 MCG/2ML IJ SOLN
INTRAMUSCULAR | Status: AC
  Administered 2020-04-30: 25 ug via INTRAVENOUS
  Filled 2020-04-30: qty 2

## 2020-04-30 MED ORDER — OXYCODONE-ACETAMINOPHEN 5-325 MG PO TABS
1.0000 | ORAL_TABLET | ORAL | Status: DC | PRN
  Administered 2020-05-03: 1 via ORAL
  Administered 2020-05-07: 2 via ORAL
  Administered 2020-05-08: 1 via ORAL
  Filled 2020-04-30: qty 2
  Filled 2020-04-30: qty 1
  Filled 2020-04-30: qty 2

## 2020-04-30 MED ORDER — PERFLUTREN LIPID MICROSPHERE
1.0000 mL | INTRAVENOUS | Status: AC | PRN
  Administered 2020-04-30: 2 mL via INTRAVENOUS
  Filled 2020-04-30: qty 10

## 2020-04-30 MED ORDER — POTASSIUM CHLORIDE 10 MEQ/100ML IV SOLN
10.0000 meq | INTRAVENOUS | Status: DC
  Filled 2020-04-30 (×2): qty 100

## 2020-04-30 MED ORDER — CHLORHEXIDINE GLUCONATE CLOTH 2 % EX PADS
6.0000 | MEDICATED_PAD | Freq: Every day | CUTANEOUS | Status: DC
  Administered 2020-04-30 – 2020-05-12 (×11): 6 via TOPICAL

## 2020-04-30 MED ORDER — PIPERACILLIN-TAZOBACTAM 3.375 G IVPB
INTRAVENOUS | Status: AC
  Administered 2020-04-30: 3.375 g via INTRAVENOUS
  Filled 2020-04-30: qty 50

## 2020-04-30 MED ORDER — DAKINS (1/2 STRENGTH) 0.25 % EX SOLN
Freq: Once | CUTANEOUS | Status: DC
  Filled 2020-04-30: qty 473

## 2020-04-30 MED ORDER — CLINDAMYCIN PHOSPHATE 900 MG/50ML IV SOLN
INTRAVENOUS | Status: AC
  Administered 2020-04-30: 900 mg via INTRAVENOUS
  Filled 2020-04-30: qty 50

## 2020-04-30 MED ORDER — INSULIN STARTER KIT- PEN NEEDLES (ENGLISH)
1.0000 | Freq: Once | Status: AC
  Administered 2020-04-30: 1
  Filled 2020-04-30: qty 1

## 2020-04-30 MED ORDER — FENTANYL CITRATE (PF) 100 MCG/2ML IJ SOLN
INTRAMUSCULAR | Status: DC | PRN
  Administered 2020-04-30 (×2): 50 ug via INTRAVENOUS

## 2020-04-30 MED ORDER — LINEZOLID 600 MG/300ML IV SOLN
600.0000 mg | Freq: Two times a day (BID) | INTRAVENOUS | Status: DC
  Administered 2020-04-30 – 2020-05-04 (×7): 600 mg via INTRAVENOUS
  Filled 2020-04-30 (×9): qty 300

## 2020-04-30 MED ORDER — FENTANYL CITRATE (PF) 100 MCG/2ML IJ SOLN
INTRAMUSCULAR | Status: AC
  Filled 2020-04-30: qty 2

## 2020-04-30 MED ORDER — POTASSIUM CHLORIDE 10 MEQ/100ML IV SOLN
10.0000 meq | INTRAVENOUS | Status: AC
  Administered 2020-04-30 (×2): 10 meq via INTRAVENOUS
  Filled 2020-04-30 (×3): qty 100

## 2020-04-30 MED ORDER — FENTANYL CITRATE (PF) 100 MCG/2ML IJ SOLN
25.0000 ug | INTRAMUSCULAR | Status: DC | PRN
  Administered 2020-04-30 (×3): 25 ug via INTRAVENOUS

## 2020-04-30 MED ORDER — ENOXAPARIN SODIUM 40 MG/0.4ML ~~LOC~~ SOLN: 40.0000 mg | SUBCUTANEOUS | Status: DC

## 2020-04-30 MED ORDER — ETOMIDATE 2 MG/ML IV SOLN
INTRAVENOUS | Status: AC
  Filled 2020-04-30: qty 10

## 2020-04-30 MED ORDER — LIVING WELL WITH DIABETES BOOK
Freq: Once | Status: AC
  Filled 2020-04-30: qty 1

## 2020-04-30 SURGICAL SUPPLY — 32 items
BLADE CLIPPER SURG (BLADE) ×1 IMPLANT
BLADE SURG 15 STRL LF DISP TIS (BLADE) ×1 IMPLANT
BLADE SURG 15 STRL SS (BLADE) ×3
BRIEF STRETCH MATERNITY 2XLG (MISCELLANEOUS) ×2 IMPLANT
BRUSH SCRUB EZ  4% CHG (MISCELLANEOUS)
BRUSH SCRUB EZ 4% CHG (MISCELLANEOUS) ×1 IMPLANT
CANISTER SUCT 3000ML PPV (MISCELLANEOUS) ×1 IMPLANT
CNTNR SPEC 2.5X3XGRAD LEK (MISCELLANEOUS) ×1
CONT SPEC 4OZ STER OR WHT (MISCELLANEOUS) ×2
CONT SPEC 4OZ STRL OR WHT (MISCELLANEOUS) ×1
CONTAINER SPEC 2.5X3XGRAD LEK (MISCELLANEOUS) IMPLANT
COVER WAND RF STERILE (DRAPES) ×3 IMPLANT
DRAPE LAPAROTOMY 77X122 PED (DRAPES) ×3 IMPLANT
DRAPE PERI LITHO V/GYN (MISCELLANEOUS) ×2 IMPLANT
DRAPE UNDER BUTTOCK W/FLU (DRAPES) ×2 IMPLANT
ELECT CAUTERY BLADE 6.4 (BLADE) ×2 IMPLANT
ELECT REM PT RETURN 9FT ADLT (ELECTROSURGICAL) ×3
ELECTRODE REM PT RTRN 9FT ADLT (ELECTROSURGICAL) ×1 IMPLANT
GAUZE SPONGE 4X4 12PLY STRL (GAUZE/BANDAGES/DRESSINGS) ×2 IMPLANT
GLOVE BIO SURGEON STRL SZ7 (GLOVE) ×9 IMPLANT
GOWN STRL REUS W/ TWL LRG LVL3 (GOWN DISPOSABLE) ×2 IMPLANT
GOWN STRL REUS W/TWL LRG LVL3 (GOWN DISPOSABLE) ×6
NEEDLE HYPO 22GX1.5 SAFETY (NEEDLE) ×1 IMPLANT
NS IRRIG 1000ML POUR BTL (IV SOLUTION) ×3 IMPLANT
NS IRRIG 500ML POUR BTL (IV SOLUTION) ×2 IMPLANT
PACK BASIN MINOR (MISCELLANEOUS) ×3 IMPLANT
PAD ABD DERMACEA PRESS 5X9 (GAUZE/BANDAGES/DRESSINGS) ×2 IMPLANT
SOL PREP PVP 2OZ (MISCELLANEOUS) ×3
SOLUTION PREP PVP 2OZ (MISCELLANEOUS) ×1 IMPLANT
SPONGE LAP 18X18 RF (DISPOSABLE) ×5 IMPLANT
SWAB CULTURE AMIES ANAERIB BLU (MISCELLANEOUS) ×4 IMPLANT
SYR BULB IRRIG 60ML STRL (SYRINGE) ×2 IMPLANT

## 2020-04-30 NOTE — Plan of Care (Signed)
Patient noted to have elevated troponin initial troponin 2104 repeat troponin 2734. Patient is chest pain-free. Case discussed with cardiology Dr. Antoine Poche on call.  Per cardiology given severity of patient illness DKA, sepsis, abscess or cellulitis, dehydration severe acidosis This level of troponin elevation is not surprising in most likely indicates demand ischemia as patient having no chest pain and no EKG changes. Dr. Antoine Poche agreed to review EKG. EKG also have been reviewed by me showing no acute ischemic changes. Per cardiology as well as my own assessment at this point no further cardiac work-up is indicated prior to proceeding to the OR. As per surgical consult there is an emergent indication for surgical intervention.  Miriana Gaertner 1:29 AM

## 2020-04-30 NOTE — Progress Notes (Signed)
PHARMACIST - PHYSICIAN COMMUNICATION  CONCERNING:  Enoxaparin (Lovenox) for DVT Prophylaxis    RECOMMENDATION: Patient was prescribed enoxaprin 40mg  q24 hours for VTE prophylaxis.   Filed Weights   04/29/20 1418 04/30/20 0828  Weight: 82.6 kg (182 lb) 103 kg (227 lb 1.2 oz)    Body mass index is 41.53 kg/m.  Estimated Creatinine Clearance: 59.8 mL/min (A) (by C-G formula based on SCr of 1.07 mg/dL (H)).   Based on [facility] policy patient is candidate for enoxaparin 0.5mg /kg TBW SQ every 24 hours based on BMI being >30.  DESCRIPTION: Pharmacy has adjusted enoxaparin dose per [facility] policy.  Patient is now receiving enoxaparin 52.5 mg every 24 hours    [facility], PharmD Pharmacy Resident  04/30/2020 3:50 PM

## 2020-04-30 NOTE — Progress Notes (Addendum)
Inpatient Diabetes Program Recommendations  AACE/ADA: New Consensus Statement on Inpatient Glycemic Control   Target Ranges:  Prepandial:   less than 140 mg/dL      Peak postprandial:   less than 180 mg/dL (1-2 hours)      Critically ill patients:  140 - 180 mg/dL  Results for MCCALL, WILL (MRN [id number]) as of 04/30/2020 07:40  Ref. Range 04/30/2020 00:32 04/30/2020 02:38 04/30/2020 03:45 04/30/2020 04:46 04/30/2020 06:00 04/30/2020 06:55  Glucose-Capillary Latest Ref Range: 70 - 99 mg/dL 192 (H) 307 (H) 278 (H) 269 (H) 228 (H) 206 (H)  Results for KAMBRY, TAKACS (MRN [id number]) as of 04/30/2020 07:40  Ref. Range 04/29/2020 14:20 04/29/2020 19:16 04/29/2020 19:58 04/29/2020 20:49 04/29/2020 21:51 04/29/2020 22:31 04/29/2020 23:12  Glucose-Capillary Latest Ref Range: 70 - 99 mg/dL >600 (HH) 589 (HH) 502 (HH) 447 (H) 402 (H) 403 (H) 218 (H)  Results for IREAN, KENDRICKS (MRN [id number]) as of 04/30/2020 07:40  Ref. Range 04/30/2020 04:57  CO2 Latest Ref Range: 22 - 32 mmol/L 12 (L)  Glucose Latest Ref Range: 70 - 99 mg/dL 275 (H)  Anion gap Latest Ref Range: 5 - 15  14  Results for RALPH, BENAVIDEZ (MRN [id number]) as of 04/30/2020 07:40  Ref. Range 04/30/2020 04:57  Beta-Hydroxybutyric Acid Latest Ref Range: 0.05 - 0.27 mmol/L 2.81 (H)   Results for HENCHY, MCCAULEY (MRN [id number]) as of 04/30/2020 07:40  Ref. Range 04/29/2020 17:25  Beta-Hydroxybutyric Acid Latest Ref Range: 0.05 - 0.27 mmol/L 7.45 (H)  Glucose Latest Ref Range: 70 - 99 mg/dL 527 (HH)   Review of Glycemic Control  Diabetes history: No Outpatient Diabetes medications: NA Current orders for Inpatient glycemic control: IV insulin per DKA  Inpatient Diabetes Program Recommendations:    Insulin: IV insulin should be continued until acidosis is completely resolved (as evidenced by CO2 >20, AG <10-12, and Betahydroxybutyric acid < 0.5). Once acidosis is resolved, consider ordering Lantus 17 units Q24H  (based on 82.6 kg x 0.2 units), CBGs Q4H, Novolog 0-15 units Q4H.   HbgA1C: A1C in process.  NOTE: Per chart, patient does not have PCP or insurance and no prior hx of DM. Patient presented with N/V/ increased thirst and fatigue. Patient was unaware she is diabetic Her BG per EMS was 404 mg/dl.  Patient admitted with DKA and noted to have perianal abscess which required emergent surgery. Patient is currently in peroperative area and is to be admitted to stepdown. Initial glucose 527 mg/dl on labs and patient was started on IV insulin per DKA protocol and is currently still on IV insulin and most current glucose 206 mg/dl at 6:55 am today.  IV insulin should be continued until acidosis is completely resolved. A1C in process. Ordered TOC consult to assist with follow up and medication needs. Will plan to order Living Well with DM book, RD consult, and patient education once patient has transferred to stepdown bed.   Addendum 04/30/20_0 :52: Diabetes Coordinator working remotely today. Tried to call patient's room but no answer.  Ordered Living Well with DM book, insulin starter kit, RD consult for diet education, and patient education by RNs once appropriate. Diabetes Coordinator will plan to follow up with patient tomorrow regarding new DM dx.   Thanks, Barnie Alderman, RN, MSN, CDE Diabetes Coordinator Inpatient Diabetes Program [phone] (Team Pager from 8am to 5pm)

## 2020-04-30 NOTE — Transfer of Care (Signed)
Immediate Anesthesia Transfer of Care Note  Patient: FOY MUNGIA  Procedure(s) Performed: INCISION AND DRAINAGE PERIRECTAL  ABSCESS (N/A Perineum)  Patient Location: PACU  Anesthesia Type:General  Level of Consciousness: sedated  Airway & Oxygen Therapy: Patient Spontanous Breathing and Patient connected to face mask oxygen  Post-op Assessment: Report given to RN and Post -op Vital signs reviewed and stable  Post vital signs: Reviewed and stable  Last Vitals:  Vitals Value Taken Time  BP 160/91 04/30/20 0343  Temp 36.6 C 04/30/20 0343  Pulse 121 04/30/20 0349  Resp    SpO2 98 % 04/30/20 0349  Vitals shown include unvalidated device data.  Last Pain:  Vitals:   04/30/20 0343  TempSrc:   PainSc: (P) 0-No pain      Patients Stated Pain Goal: 0 (04/29/20 1742)  Complications: No complications documented.

## 2020-04-30 NOTE — Op Note (Signed)
  04/29/2020 - 04/30/2020  3:02 AM  PATIENT:  Michelle Stark  64 y.o. 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  FINDINGS; evidence of necrotizing soft tissue infection with significant tunneling on the cranial aspect toward the pubic bone.  No evidence of tracking into the rectum or vagina.    SURGEON:  Surgeon(s) and Role:    * Yuritzi Kamp F, MD - Primary  EBL: 10cc  ANESTHESIA: GETA    DICTATION:  Patient was explained  about the procedure in detail. Risks, benefits and possible complications and a consent was obtained. The patient taken to the operating room and placed in the lithotomy position. Significant exudate from the right gluteal and perineal aspect.  There was an opening measuring about 1 x 1 cm.  We went ahead and probed it with the suction tape and foul-smelling dishwater fluid came out.  Appropiate cultures were obtained there was also pus that was drained.  I proceeded to extend my incision in an elliptical fashion using electrocautery in the standard fashion.  Excisional debridement was performed with electrocautery, cooperating skin subcutaneous tissue and fascia and also with my finger dissection breaking down all the loculations and devitalized tissue   Hemostasis was obtained with electrocautery. Irrigation with normal saline and the wound was packed with kerlix packing and Dakin solution. . Needle and laparotomy counts were correct and there were no immediate complications  Leafy Ro, MD

## 2020-04-30 NOTE — Anesthesia Postprocedure Evaluation (Signed)
Anesthesia Post Note  Patient: Michelle Stark  Procedure(s) Performed: INCISION AND DRAINAGE PERIRECTAL  ABSCESS (N/A Perineum)  Patient location during evaluation: PACU Anesthesia Type: General Level of consciousness: awake and alert Pain management: pain level controlled Vital Signs Assessment: post-procedure vital signs reviewed and stable Respiratory status: spontaneous breathing and respiratory function stable Cardiovascular status: stable Anesthetic complications: no   No complications documented.   Last Vitals:  Vitals:   04/30/20 0125 04/30/20 0343  BP:  (!) 160/91  Pulse:  69  Resp:    Temp: (!) 36.4 C 36.6 C  SpO2:  98%    Last Pain:  Vitals:   04/30/20 0343  TempSrc:   PainSc: 0-No pain                 [name]

## 2020-04-30 NOTE — Consult Note (Signed)
Cardiology Consultation:   Patient ID: Michelle Stark MRN: [id number]; DOB: 1955/07/21  Admit date: 04/29/2020 Date of Consult: 04/30/2020  Primary Care Provider: Patient, No Pcp Per Primary Cardiologist: [facility], Dr. Azucena Cecil rounding Primary Electrophysiologist:  None    Patient Profile:   Michelle Stark is a 64 y.o. 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 Arville Care.  History of Present Illness:   Michelle Stark is a 64 year old female with PMH as above.    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 Per family, she has a father that suffered an MI with subsequent CABG in his 48-60s.  She has not been to a doctor in approximately 5 years with no previously known history of cardiac disease or arrhythmia.  She quit smoking when she was approximately 64 years of age and occasionally drinks alcohol at 1-2 drinks 2-4 per month.  No history of drug use.  She did not know that she has diabetes prior to this admission.  She reportedly was struggling to pass a kidney stone over the past weekend and reported to family that she had finally passed her kidney stone on Saturday, 04/26/2020.  On Sunday 10/17, she was visited by family that felt she looked poorly with patient report of several days of weakness, nausea, and  emesis. The family reassessed her on Monday and Tuesday and noted AMS with ongoing nausea and emesis. Per family, she did not report any chest pain or shortness of breath at any time leading up to her admission.  Also, to this admission, she was reportedly able to do household chores without any symptoms concerning for angina.  Due to family concern for progressive symptoms and AMS, she was brought to [facility] emergency department for further evaluation. At presentation, she was found to have elevated troponin at 2104  2734  3261 and still trending.  EKG 10/19 showed sinus tachycardia with rate 128, PACs, and was without acute ST/T changes.  Follow-up EKG showed NSR with LAD and poor anterior R wave progression.  She was found to have elevated blood sugars with a new diagnosis of diabetes.  She was also noted to have an elevated lactic acid level with noted abscess on her buttocks, requiring emergent surgical intervention for necrotizing infection of the perineum.    She is currently admitted for DKA and perirectal abscess with sepsis due to necrotizing infection.  She has not reported chest pain to any of the nursing staff or physicians during her admission per review of EMR. Cardiology has been consulted given her elevated troponin this admission.   Heart Pathway Score:     Past Medical History:  Diagnosis Date  . Hyperlipidemia   . Hypertension   . Obesity     Past Surgical History:  Procedure Laterality Date  . CHOLECYSTECTOMY    . RHINOPLASTY       Home Medications:  Prior  to Admission medications   Not on File    Inpatient Medications: Scheduled Meds: . Chlorhexidine Gluconate Cloth  6 each Topical Daily  . sodium hypochlorite   Irrigation Once   Continuous Infusions: . albumin human Stopped (04/30/20 0520)  . clindamycin (CLEOCIN) IV 900 mg (04/30/20 0534)  . dextrose 5% lactated ringers 125 mL/hr at 04/29/20 2028  . insulin 5 Units/hr (04/30/20 1015)  . lactated ringers    .  piperacillin-tazobactam (ZOSYN)  IV Stopped (04/30/20 1018)  . vancomycin 750 mg (04/30/20 1017)   PRN Meds: dextrose, oxyCODONE-acetaminophen  Allergies:    Allergies  Allergen Reactions  . Bee Venom Anaphylaxis  . Ciprofloxacin Itching  . Lisinopril     Cough     Social History:   Social History   Socioeconomic History  . Marital status: Divorced    Spouse name: Not on file  . Number of children: Not on file  . Years of education: Not on file  . Highest education level: Not on file  Occupational History  . Not on file  Tobacco Use  . Smoking status: Former Games developer  . Smokeless tobacco: Never Used  Substance and Sexual Activity  . Alcohol use: Yes    Comment: occ  . Drug use: Not Currently  . Sexual activity: Not on file  Other Topics Concern  . Not on file  Social History Narrative  . Not on file   Social Determinants of Health   Financial Resource Strain:   . Difficulty of Paying Living Expenses: Not on file  Food Insecurity:   . Worried About Programme researcher, broadcasting/film/video in the Last Year: Not on file  . Ran Out of Food in the Last Year: Not on file  Transportation Needs:   . Lack of Transportation (Medical): Not on file  . Lack of Transportation (Non-Medical): Not on file  Physical Activity:   . Days of Exercise per Week: Not on file  . Minutes of Exercise per Session: Not on file  Stress:   . Feeling of Stress : Not on file  Social Connections:   . Frequency of Communication with Friends and Family: Not on file  . Frequency of Social Gatherings with Friends and Family: Not on file  . Attends Religious Services: Not on file  . Active Member of Clubs or Organizations: Not on file  . Attends Banker Meetings: Not on file  . Marital Status: Not on file  Intimate Partner Violence:   . Fear of Current or Ex-Partner: Not on file  . Emotionally Abused: Not on file  . Physically Abused: Not on file  . Sexually Abused: Not on file    Family History:     Father with history of MI and CABG per family report Family History  Problem Relation Age of Onset  . Hypertension Mother   . Hypertension Father      ROS:  Please see the history of present illness.  Review of Systems  Unable to perform ROS: Medical condition  Patient is sedated.  Per family no chest pain or shortness of breath.  She reports a 1 week history of nausea and emesis, as well as weakness.  Per family, increasing AMS. All other ROS reviewed and negative.     Physical Exam/Data:   Vitals:   04/30/20 0710 04/30/20 0828 04/30/20 0905 04/30/20 1000  BP: 100/61  (!) 144/63 (!) 107/91  Pulse: 95  96 (!) 102  Resp: (!) 21  (!) 28 (!)  29  Temp: 97.7 F (36.5 C)     TempSrc: Axillary     SpO2: 100%  99% 100%  Weight:  103 kg    Height:  5\' 2"  (1.575 m)      Intake/Output Summary (Last 24 hours) at 04/30/2020 1047 Last data filed at 04/30/2020 0330 Gross per 24 hour  Intake 2246.24 ml  Output --  Net 2246.24 ml   Last 3 Weights 04/30/2020 04/29/2020 10/12/2012  Weight (lbs) 227 lb 1.2 oz 182 lb 297 lb  Weight (kg) 103 kg 82.555 kg 134.718 kg     Body mass index is 41.53 kg/m.  General: Sedated, no acute distress.  Arouses for 1 to 2 seconds only with multiple attempts to wake and without opening her eyes or speaking. HEENT: normal Neck: Difficult to assess due to body habitus Vascular: No carotid bruits; radial pulses 2+ bilaterally Cardiac:  normal S1, S2; RRR; no murmur appreciated with consideration of snoring on exam due to sedation Lungs:  clear to auscultation bilaterally throughout anterior auscultation only due to sedation.  No wheezing, rhonchi or rales with anterior auscultation only. Abd: soft, nontender, no hepatomegaly  Ext: no edema Musculoskeletal:  No deformities Skin: warm and dry  Neuro: Sedated Psych: Sedated  EKG:  The EKG was personally reviewed and demonstrates: Sinus tachycardia, no acute ST or T changes Telemetry:  Telemetry was  personally reviewed and demonstrates: NSR, ST, PACs, most recent rates 90s  Relevant CV Studies: Pending echocardiogram  Laboratory Data:  High Sensitivity Troponin:   Recent Labs  Lab 04/29/20 2155 04/29/20 2316 04/30/20 0457  TROPONINIHS 2,104* 2,734* 3,261*     Cardiac EnzymesNo results for input(s): TROPONINI in the last 168 hours. No results for input(s): TROPIPOC in the last 168 hours.  Chemistry Recent Labs  Lab 04/29/20 1725 04/29/20 2151 04/30/20 0457  NA 134* 135 138  K 3.4* 3.4* 3.4*  CL 106 105 112*  CO2 <7* 11* 12*  GLUCOSE 527* 408* 275*  BUN 43* 47* 42*  CREATININE 1.37* 1.42* 1.11*  CALCIUM 8.1* 9.0 8.7*  GFRNONAA 41* 39* 52*  ANIONGAP NOT CALCULATED 19* 14    No results for input(s): PROT, ALBUMIN, AST, ALT, ALKPHOS, BILITOT in the last 168 hours. Hematology Recent Labs  Lab 04/29/20 1411  WBC 35.6*  35.3*  RBC 5.11  5.13*  HGB 16.0*  16.0*  HCT 47.9*  47.2*  MCV 93.7  92.0  MCH 31.3  31.2  MCHC 33.4  33.9  RDW 14.6  14.1  PLT 608*  594*   BNPNo results for input(s): BNP, PROBNP in the last 168 hours.  DDimer No results for input(s): DDIMER in the last 168 hours.   Radiology/Studies:  CT PELVIS WO CONTRAST  Result Date: 04/29/2020 CLINICAL DATA:  Pelvic abscess EXAM: CT PELVIS WITHOUT CONTRAST TECHNIQUE: Multidetector CT imaging of the pelvis was performed following the standard protocol without intravenous contrast. COMPARISON:  04/29/2020 at 1847 hours FINDINGS: Urinary Tract: Residual contrast material fills the bladder. No bladder wall thickening or filling defect. Bowel: Visualized portions of the small bowel and colon are normal in appearance. The appendix is normal. Vascular/Lymphatic: Few scattered calcifications in the visualized iliac and external iliac arteries. No aneurysm. Reproductive: Uterus and ovaries are not enlarged. No free fluid in the pelvis. Other: Soft tissue gas and soft tissue infiltration demonstrated in the  medial aspect of the right buttocks region extending to the gluteal crease, mostly inferior to the anus. Changes are consistent with cellulitis.  The presence of soft tissue gas may indicate Fournier's gangrene. Small medial fluid collection in the superficial subcutaneous tissues measures 1.4 x 2.8 cm. This is consistent with a small abscess. Appearances are unchanged since the previous study. Musculoskeletal: Degenerative changes in the lower lumbar spine. No focal bone lesion or bone destruction. IMPRESSION: 1. Right medial gluteal soft tissue infiltration and soft tissue gas consistent with cellulitis, possibly representing Fournier's gangrene. 2. Small subcutaneous abscess. 3. No change since prior study. Electronically Signed   By: Burman Nieves M.D.   On: 04/29/2020 23:31   CT ABDOMEN PELVIS W CONTRAST  Addendum Date: 04/29/2020   ADDENDUM REPORT: 04/29/2020 21:39 ADDENDUM: After speaking with Dr. Katrinka Blazing in the ER, it is noted that there is stranding and gas within the medial right buttock subcutaneous soft tissues near the gluteal cleft concerning for cellulitis/infection with possible developing abscess. These results were discussed by telephone at the time of interpretation on 04/29/2020 at 9:39 pm to provider [facility] , who verbally acknowledged these results. Electronically Signed   By: Charlett Nose M.D.   On: 04/29/2020 21:39   Result Date: 04/29/2020 CLINICAL DATA:  Acute nonlocalized abdominal pain. Recent passage of left renal stone. Diabetic ketoacidosis with leukocytosis and hypothermia. EXAM: CT ABDOMEN AND PELVIS WITH CONTRAST TECHNIQUE: Multidetector CT imaging of the abdomen and pelvis was performed using the standard protocol following bolus administration of intravenous contrast. CONTRAST:  58mL OMNIPAQUE IOHEXOL 300 MG/ML  SOLN COMPARISON:  None. FINDINGS: Lower chest: Lung bases are clear.  No pleural or pericardial fluid. Hepatobiliary: Liver parenchyma is normal. Previous  cholecystectomy. Pancreas: Normal Spleen: Normal Adrenals/Urinary Tract: Adrenal glands are normal. Kidneys are normal. No cyst, mass, stone or hydronephrosis. No stone along the course of either ureter. There is phlebolith or a small left ovarian calcification at the left pelvic brim adjacent to the ureter. No stone in the bladder. Stomach/Bowel: Stomach, small intestine and colon appear unremarkable. Vascular/Lymphatic: Aortic atherosclerosis. No aneurysm. IVC is normal. No retroperitoneal adenopathy. Reproductive: No pelvic mass. Uterus and adnexal regions appear normal. Other: No free fluid or air. Musculoskeletal: Lower lumbar degenerative changes with a degree of stenosis at L4-5. IMPRESSION: 1. No acute finding. No evidence of urinary tract stone disease or other urinary tract pathology. Phlebolith or small left ovarian calcification at the left pelvic brim is adjacent to the ureter and simulates a ureteral stone. 2. Previous cholecystectomy. 3. 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 4. Aortic atherosclerosis. Aortic Atherosclerosis (ICD10-I70.0). Electronically Signed: By: Paulina Fusi M.D. On: 04/29/2020 19:10   DG Chest Portable 1 View  Result Date: 04/29/2020 CLINICAL DATA:  Tachypnea.  Shortness of breath.  Diabetes. EXAM: PORTABLE CHEST 1 VIEW COMPARISON:  None. FINDINGS: Artifact overlies the chest. Heart size is normal. Aortic atherosclerotic calcification. The pulmonary vascularity is normal. The lungs are clear. No effusions. No acute bone finding. IMPRESSION: No active disease. Aortic atherosclerotic calcification. Electronically Signed   By: Paulina Fusi M.D.   On: 04/29/2020 15:49    Assessment and Plan:   Elevated troponin without chest pain --No reported chest pain at rest or with exertion leading up to admission. --High-sensitivity troponin elevated as above and continues to cycle. --EKG without acute ST/T changes.  ST with PACs noted. --Echo pending  to assess EF, wall motion, and rule out acute structural abnormalities. --Risk factors for CAD include previous history of smoking, DM2, hyperlipidemia, and family history of CAD/CABG in her father as above.  Also  considered, and as indicated in initial cardiology note, was supply demand ischemia in the setting of her DKA and necrotizing infection, rapid HR, elevated BP, AKI, and hypokalemia. --Given elevation in troponin and risk factors for CAD, cannot rule out elevation of troponin due to cardiac ischemia, and recommend further cardiac testing with echo pending. Continue to cycle troponin and monitor on telemetry.  If EF reduced by echo, recommend further ischemic work-up this admission and once recovered from her infection. If echocardiogram is without significant findings, and no CP or concerning cardiac sx reported this admission, consider ischemic work-up as an outpatient once recovered and close follow-up with our office. --Start ASA 81 mg daily once felt safe to do so per surgical team.  BB in the setting of ST/PACs precluded by hypotension with rates suspected to improve with recovery from infection.  Risk factor modification recommended, including initiation of statin before discharge.    HTN --BP elevated at presentation 140/110.  Current BP controlled to soft at 107/91.  Continue to monitor.  HLD Hypertriglyceridemia --As above.  LDL 167.  Total cholesterol 266.  TG 367.  Recommend initiation of statin before discharge.  We will check baseline liver function.  Follow-up lipid and liver function in 6 to 8 weeks.  Hypokalemia --Potassium 3.4 at presentation.  Replete with goal 4.0.  Check magnesium.  DM2, newly diagnosed --Glycemic control recommended for risk factor modification.  SSI.  Per IM.   Necrotizing infection of perineum Sepsis. --Per IM, surgical team, ID.  Recommend ongoing treatment of underlying infection.  For questions or updates, please contact [facility] Please  consult [email or web address] for contact info under     Signed, Lennon Alstrom, PA-C  04/30/2020 10:47 AM

## 2020-04-30 NOTE — Anesthesia Preprocedure Evaluation (Addendum)
Anesthesia Evaluation  Patient identified by MRN, date of birth, ID band Patient awake    Reviewed: Allergy & Precautions, NPO status , Patient's Chart, lab work & pertinent test results  History of Anesthesia Complications Negative for: history of anesthetic complications  Airway Mallampati: III       Dental   Pulmonary neg sleep apnea, neg COPD, Not current smoker, former smoker,           Cardiovascular hypertension, Pt. on medications + CAD (pt with elevated troponins, cards states demand ishcemia.)  (-) Past MI and (-) CHF (-) dysrhythmias (-) Valvular Problems/Murmurs     Neuro/Psych neg Seizures    GI/Hepatic Neg liver ROS, neg GERD  ,  Endo/Other  diabetes (pt on insulin drip), Type 2, Insulin Dependent  Renal/GU Renal disease     Musculoskeletal   Abdominal   Peds  Hematology   Anesthesia Other Findings   Reproductive/Obstetrics                           Anesthesia Physical Anesthesia Plan  ASA: III and emergent  Anesthesia Plan: General   Post-op Pain Management:    Induction: Intravenous  PONV Risk Score and Plan: 3 and Ondansetron and Treatment may vary due to age or medical condition  Airway Management Planned: Oral ETT  Additional Equipment:   Intra-op Plan:   Post-operative Plan:   Informed Consent: I have reviewed the patients History and Physical, chart, labs and discussed the procedure including the risks, benefits and alternatives for the proposed anesthesia with the patient or authorized representative who has indicated his/her understanding and acceptance.       Plan Discussed with:   Anesthesia Plan Comments: (Pt informed of increase risk with elevated troponin. Pt understands the risk and the emergent status of surgery and is willing to proceed. )       Anesthesia Quick Evaluation

## 2020-04-30 NOTE — Progress Notes (Signed)
*  PRELIMINARY RESULTS* Echocardiogram 2D Echocardiogram has been performed.  Joanette Gula Briony Parveen 04/30/2020, 12:20 PM

## 2020-04-30 NOTE — Progress Notes (Signed)
    I was called by Dr. Kara Pacer and asked to review the EKG on this patient with elevated hsTrop.  Chart is reviewed including labs and EKG.   The patient was admitted with AMS.  The patient is confused and trying to get out of bed per nursing notes.  However, she was able to talk to me on the phone and reports that she came into the hospital with a one week history of progressive weakness, nausea and vomiting.  She has no past cardiac history per her report.  She has no active cardiac symptoms by her report.  She does not get chest pain/pressure, jaw or arm pain and she,prior to this, was able to do household chores without symptoms. .  No significant past cardiac history reported by admitting MD or found in the chart.   She is found to have elevated hs Trop without significant trend upward. 2,104 (2104 hours) and 2,734 (2316 hours).  Initial EKG at 1419 on October 19 demonstrated no acute ST changes.  She had sinus tachycardia with rate 128 and PACs.  Follow up EKG at 0026 04/30/20 again demonstrates no acute ST T wave changes, she has NSR with LAD and poor anterior R wave progression.  She is found to have elevated blood sugars and now is noted to have an elevated lactic acid level. She is found to have an abscess on her buttocks and is going to be taken to the OR for debridement this this evening.  The diagnosis is perirectal abscess with sepsis and DKA.  Of note the patient apparently passed a kidney stone at home a few days ago.  She was unaware that she had diabetes.  The admitting MD documents that she has had no reports of chest pain.    The patient is going for drainage of a perirectal abscess with resultant complicated comorbid conditions including sepsis and DKA.  Surgery is necessary.  She has no high risk historical findings for acute cardiovascular complications.  She is likely having demand ischemia and likely does have atherosclerotic coronary disease but there is no evidence of acute ST  elevation MI or history consistent with unstable angina and coronary plaque rupture as a precipitating etiology of her acute presentation.  She would be at higher risk for surgery based only on the troponin elevation.  However,  this risk is not prohibitive to preclude necessary debridement.  Further cardiac testing including invasive coronary evaluation is not indicated or appropriate prior to this urgent procedure according to ACC/AHA guidelines.  The patient is aware.  I tried to call her son to ask further questions but he did not answer his cell phone and I could not leave a message as the mailbox was full.  No further cardiac work up prior to urgent surgery.  We will see in consultation during this admission.  If she is thought to have low bleeding risk post op please start ASA.  Continue to cycle enzymes and check an echo.  We will follow

## 2020-04-30 NOTE — Progress Notes (Signed)
CH visited rm. while rounding on ICU; pt. asleep; son Toney Sang at bedside.  Son shares that pt. came to hospital for kidney stones but she's does not frequently go to the doctor and upon examination medical team diagnosed her w/diabetes and some related health complications.  Son shares no immediate needs at this time but was grateful for visit. CH remains available as needed.

## 2020-04-30 NOTE — Anesthesia Procedure Notes (Signed)
Procedure Name: Intubation Date/Time: 04/30/2020 2:20 AM Performed by: Waldo Laine, CRNA Pre-anesthesia Checklist: Patient identified, Patient being monitored, Timeout performed, Emergency Drugs available and Suction available Patient Re-evaluated:Patient Re-evaluated prior to induction Oxygen Delivery Method: Circle system utilized Preoxygenation: Pre-oxygenation with 100% oxygen Induction Type: IV induction Ventilation: Two handed mask ventilation required and Oral airway inserted - appropriate to patient size Laryngoscope Size: 3 and McGraph Grade View: Grade I Tube type: Oral Tube size: 7.0 mm Number of attempts: 1 Airway Equipment and Method: Stylet Placement Confirmation: ETT inserted through vocal cords under direct vision,  positive ETCO2 and breath sounds checked- equal and bilateral Secured at: 21 cm Tube secured with: Tape Dental Injury: Teeth and Oropharynx as per pre-operative assessment

## 2020-04-30 NOTE — Consult Note (Signed)
Patient ID: Michelle Stark, female   DOB: 1955-11-29, 64 y.o.   MRN: [id number]  HPI Michelle Stark is a 64 y.o. female asked to see in consultation by Dr.Doutova for perirectal abscess vs necrotizing infection.  He came to the emergency room a few hours ago presenting with nausea vomiting fatigue and left lower quadrant pain.  Apparently a few days ago she passed a kidney stone.  She did not know that she was diabetic.  He does report polyuria and polydipsia.  On further exam today they noticed a right sided perirectal abscess with spontaneous drainage.  She did have a CT scan that I have personally reviewed and discussed with the radiologist.  There is evidence of a right soft tissue infection the biotics with perirectal abscess and significant cellulitis.  Classical for necrotizing infection but cannot completely excluded.  More importantly she is septic with significant acidosis both related to the sepsis and her DKA.  She has responded partially to fluid resuscitation but is still with significant acidosis.  BMP shows a glucose of 408 with a BUN of 47 creatinine 1.4 and a CO2 of 11.  Her lactate went from 2-2.5.  She also had an increase of troponins likely demand ischemia.  There is no obvious EKG changes.  HPI  Past Medical History:  Diagnosis Date  . Hyperlipidemia   . Hypertension   . Obesity     Past Surgical History:  Procedure Laterality Date  . CHOLECYSTECTOMY    . RHINOPLASTY      Family History  Problem Relation Age of Onset  . Hypertension Mother   . Hypertension Father     Social History Social History   Tobacco Use  . Smoking status: Former Games developer  . Smokeless tobacco: Never Used  Substance Use Topics  . Alcohol use: Yes    Comment: occ  . Drug use: Not Currently    Allergies  Allergen Reactions  . Bee Venom Anaphylaxis  . Ciprofloxacin Itching  . Lisinopril     Cough     Current Facility-Administered Medications  Medication Dose Route Frequency  Provider Last Rate Last Admin  . albumin human 25 % solution 12.5 g  12.5 g Intravenous Once Arlyn Buerkle, [place] F, MD      . clindamycin (CLEOCIN) IVPB 900 mg  900 mg Intravenous Q8H Therisa Doyne, MD   Stopped at 04/29/20 2258  . dextrose 5 % in lactated ringers infusion   Intravenous Continuous Delton Prairie, MD 125 mL/hr at 04/29/20 2028 New Bag at 04/29/20 2028  . dextrose 50 % solution 0-50 mL  0-50 mL Intravenous PRN Delton Prairie, MD      . insulin regular, human (MYXREDLIN) 100 units/ 100 mL infusion   Intravenous Continuous Delton Prairie, MD 3.8 mL/hr at 04/30/20 0033 3.8 Units/hr at 04/30/20 0033  . lactated ringers infusion   Intravenous Continuous Delton Prairie, MD   Stopped at 04/29/20 2336  . piperacillin-tazobactam (ZOSYN) IVPB 3.375 g  3.375 g Intravenous Q8H Ronnald Ramp, RPH      . vancomycin (VANCOREADY) IVPB 750 mg/150 mL  750 mg Intravenous Once Ronnald Ramp, RPH 150 mL/hr at 04/30/20 0022 750 mg at 04/30/20 0022  . vancomycin (VANCOREADY) IVPB 750 mg/150 mL  750 mg Intravenous Q12H Ronnald Ramp, RPH       No current outpatient medications on file.     Review of Systems Full ROS  was asked and was negative except for the information on the HPI  Physical Exam Blood pressure (!) 163/100, pulse (!) 110, temperature (!) 96 F (35.6 C), temperature source Rectal, resp. rate (!) 22, height 5\' 2"  (1.575 m), weight 82.6 kg, SpO2 100 %. CONSTITUTIONAL: Morbidly obese with some distress. EYES: Pupils are equal, round, and reactive to light, Sclera are non-icteric. EARS, NOSE, MOUTH AND THROAT: The oropharynx is clear. The oral mucosa is pink and moist. Hearing is intact to voice. LYMPH NODES:  Lymph nodes in the neck are normal. RESPIRATORY:  Lungs are clear. There is normal respiratory effort, with equal breath sounds bilaterally, and without pathologic use of accessory muscles. CARDIOVASCULAR: Heart is regular without murmurs, gallops, or rubs. GI: The abdomen is  soft,  nontender, and nondistended. There are no palpable masses. There is no hepatosplenomegaly. There are normal bowel sounds in all quadrants. Rectal: Evidence of significant soft tissue cellulitis in the right buttocks with a cavity on the right side.  It is exquisitely tender to palpation.  There is no obvious crepitus but cannot completely rule out necrotizing infection.  No rectal lesions. MUSCULOSKELETAL: Normal muscle strength and tone. No cyanosis or edema.   SKIN: Turgor is good and there are no pathologic skin lesions or ulcers. NEUROLOGIC: Motor and sensation is grossly normal. Cranial nerves are grossly intact. PSYCH:  Oriented to person, place and time. Affect is normal.  Data Reviewed  I have personally reviewed the patient's imaging, laboratory findings and medical records.    Assessment/Plan 64 year old female morbidly obese admitted with DKA sepsis presumed to be from perirectal source.  More than likely this is a perirectal abscess but I cannot completely exclude necrotizing infection and the right thing to do is to explore her tonight emergently in the operating room.  She is competent.  Procedure discussed with the patient in detail.  Risks, benefits and possible complications including but not limited to: Bleeding, infection further debridements and surgeries, deformity, chronic pain.  On the other hand if we do not do a timely operation life is in jeopardy.  I have discussed the case in detail with Dr.Doutova.  We have started already crystalloids as well as colloid and broad-spectrum antibiotics to include Zosyn, vancomycin and clindamycin. ExTensive counseling provided.  77, MD FACS General Surgeon 04/30/2020, 1:14 AM

## 2020-04-30 NOTE — Consult Note (Addendum)
NAME: Michelle Stark  DOB: 07/31/1955  MRN: [id number]  Date/Time: 04/30/2020 6:15 PM  REQUESTING PROVIDER: Chipper Herb Subjective:  REASON FOR CONSULT: Necrotizing fascitis ? Michelle Stark is a 64 y.o. female presented to the ED with Nausea , vomiting and excess thirst for the past few days. Found to have blood sugar of 404 in the ED. No h/o diabetes before. She had left sided flank pain and left groin pain and passed a kidney stone In the ED vitals were temperature of 96, BP 140/110, respiratory rate 36, HR of 122. Labs revealed WBC of 35.3, Hb of 16, platelet of 594. Blood glucose was 408, creatinine 1.42, potassium 3.4, anion gap 19, CO2 11,.  High sensitive troponin was increased at 2104.  Marland Kitchen  Blood cultures were sent. She was diagnosed with DKA and started on IV fluids and insulin. She was noted to have extensive perianal gluteal erythema on the right side concerning for cellulitis.  She was started on vancomycin, Zosyn and clindamycin.  She was seen by surgeon who took her urgently for excisional debridement of the right perianal soft tissue for necrotizing infection. Cultures were sent. Post surgery she is in the ICU  Pt is drowsy and not able to give  history    Past Medical History:  Diagnosis Date  . Hyperlipidemia   . Hypertension   . Obesity    Rhinophyma Past Surgical History:  Procedure Laterality Date  . CHOLECYSTECTOMY    . RHINOPLASTY      Social History   Socioeconomic History  . Marital status: Divorced    Spouse name: Not on file  . Number of children: Not on file  . Years of education: Not on file  . Highest education level: Not on file  Occupational History  . Not on file  Tobacco Use  . Smoking status: Former Games developer  . Smokeless tobacco: Never Used  Substance and Sexual Activity  . Alcohol use: Yes    Comment: occ  . Drug use: Not Currently  . Sexual activity: Not on file  Other Topics Concern  . Not on file  Social History Narrative  .  Not on file   Social Determinants of Health   Financial Resource Strain:   . Difficulty of Paying Living Expenses: Not on file  Food Insecurity:   . Worried About Programme researcher, broadcasting/film/video in the Last Year: Not on file  . Ran Out of Food in the Last Year: Not on file  Transportation Needs:   . Lack of Transportation (Medical): Not on file  . Lack of Transportation (Non-Medical): Not on file  Physical Activity:   . Days of Exercise per Week: Not on file  . Minutes of Exercise per Session: Not on file  Stress:   . Feeling of Stress : Not on file  Social Connections:   . Frequency of Communication with Friends and Family: Not on file  . Frequency of Social Gatherings with Friends and Family: Not on file  . Attends Religious Services: Not on file  . Active Member of Clubs or Organizations: Not on file  . Attends Banker Meetings: Not on file  . Marital Status: Not on file  Intimate Partner Violence:   . Fear of Current or Ex-Partner: Not on file  . Emotionally Abused: Not on file  . Physically Abused: Not on file  . Sexually Abused: Not on file    Family History  Problem Relation Age of Onset  . Hypertension Mother   .  Hypertension Father    Allergies  Allergen Reactions  . Bee Venom Anaphylaxis  . Ciprofloxacin Itching  . Lisinopril     Cough    ? Current Facility-Administered Medications  Medication Dose Route Frequency Provider Last Rate Last Admin  . albumin human 25 % solution 12.5 g  12.5 g Intravenous Once Leafy Ro, MD   Held at 04/30/20 [phone]  . Chlorhexidine Gluconate Cloth 2 % PADS 6 each  6 each Topical Daily Marrion Coy, MD   6 each at 04/30/20 [phone]  . clindamycin (CLEOCIN) IVPB 900 mg  900 mg Intravenous Q8H Leafy Ro, MD   Stopped at 04/30/20 1332  . dextrose 5 % and 0.45 % NaCl with KCl 20 mEq/L infusion   Intravenous Continuous Marrion Coy, MD 125 mL/hr at 04/30/20 1700 Rate Verify at 04/30/20 1700  . dextrose 50 % solution 0-50 mL   0-50 mL Intravenous PRN Doutova, Anastassia, MD      . enoxaparin (LOVENOX) injection 52.5 mg  0.5 mg/kg Subcutaneous Q24H Rauer, Robyne Peers, RPH      . insulin regular, human (MYXREDLIN) 100 units/ 100 mL infusion   Intravenous Continuous Doutova, Anastassia, MD 1.6 mL/hr at 04/30/20 1700 Rate Verify at 04/30/20 1700  . oxyCODONE-acetaminophen (PERCOCET/ROXICET) 5-325 MG per tablet 1-2 tablet  1-2 tablet Oral Q4H PRN Pabon, Diego F, MD      . piperacillin-tazobactam (ZOSYN) IVPB 3.375 g  3.375 g Intravenous Q8H Pabon, Diego F, MD 12.5 mL/hr at 04/30/20 1700 Rate Verify at 04/30/20 1700  . sodium hypochlorite (DAKIN'S 1/2 STRENGTH) topical solution   Irrigation Once Pabon, [place] F, MD      . vancomycin (VANCOREADY) IVPB 750 mg/150 mL  750 mg Intravenous Q12H Leafy Ro, MD   Stopped at 04/30/20 1256     Abtx:  Anti-infectives (From admission, onward)   Start     Dose/Rate Route Frequency Ordered Stop   04/30/20 1000  vancomycin (VANCOREADY) IVPB 750 mg/150 mL        750 mg 150 mL/hr over 60 Minutes Intravenous Every 12 hours 04/29/20 2216     04/30/20 0600  piperacillin-tazobactam (ZOSYN) IVPB 3.375 g        3.375 g 12.5 mL/hr over 240 Minutes Intravenous Every 8 hours 04/29/20 2216     04/29/20 2300  vancomycin (VANCOREADY) IVPB 750 mg/150 mL        750 mg 150 mL/hr over 60 Minutes Intravenous  Once 04/29/20 2216 04/30/20 0123   04/29/20 2200  vancomycin (VANCOREADY) IVPB 1250 mg/250 mL        1,250 mg 166.7 mL/hr over 90 Minutes Intravenous  Once 04/29/20 2136 04/29/20 2350   04/29/20 2200  clindamycin (CLEOCIN) IVPB 900 mg        900 mg 100 mL/hr over 30 Minutes Intravenous Every 8 hours 04/29/20 2158     04/29/20 2145  ceFEPIme (MAXIPIME) 2 g in sodium chloride 0.9 % 100 mL IVPB  Status:  Discontinued        2 g 200 mL/hr over 30 Minutes Intravenous  Once 04/29/20 2136 04/29/20 2200      REVIEW OF SYSTEMS:  NA Objective:  VITALS:  BP 104/90   Pulse 98   Temp 99.8 F  (37.7 C) (Oral)   Resp 19   Ht 5\' 2"  (1.575 m)   Wt 103 kg   SpO2 98%   BMI 41.53 kg/m  PHYSICAL EXAM:  General: Somnolent, on calling her name she responds to  some questions and goes back to sleep Head: Normocephalic, without obvious abnormality, atraumatic. Eyes: Conjunctivae clear, anicteric sclerae. Pupils are equal ENT Nares normal. No drainage or sinus tenderness. Lips, mucosa, and tongue normal. No Thrush Neck: Supple, symmetrical, no adenopathy, thyroid: non tender no carotid bruit and no JVD. Back: No CVA tenderness. Lungs: b/l air entry Heart: s1s2 Abdomen: Soft, non-tender,not distended. Bowel sounds normal. No masses Perianal and gluteal area- dressing  Extremities: left calf- there is an ulcerating wound Lymph: Cervical, supraclavicular normal. Neurologic: moves all limbsPertinent Labs Lab Results CBC    Component Value Date/Time   WBC 35.3 (H) 04/29/2020 1411   WBC 35.6 (H) 04/29/2020 1411   RBC 5.13 (H) 04/29/2020 1411   RBC 5.11 04/29/2020 1411   HGB 16.0 (H) 04/29/2020 1411   HGB 16.0 (H) 04/29/2020 1411   HCT 47.2 (H) 04/29/2020 1411   HCT 47.9 (H) 04/29/2020 1411   PLT 594 (H) 04/29/2020 1411   PLT 608 (H) 04/29/2020 1411   MCV 92.0 04/29/2020 1411   MCV 93.7 04/29/2020 1411   MCH 31.2 04/29/2020 1411   MCH 31.3 04/29/2020 1411   MCHC 33.9 04/29/2020 1411   MCHC 33.4 04/29/2020 1411   RDW 14.1 04/29/2020 1411   RDW 14.6 04/29/2020 1411   LYMPHSABS 1.2 04/29/2020 1411   MONOABS 2.3 (H) 04/29/2020 1411   EOSABS 0.0 04/29/2020 1411   BASOSABS 0.0 04/29/2020 1411    CMP Latest Ref Rng & Units 04/30/2020 04/30/2020 04/30/2020  Glucose 70 - 99 mg/dL 161(W156(H) 960(A227(H) 540(J275(H)  BUN 8 - 23 mg/dL 81(X39(H) 91(Y39(H) 78(G40(H)  Creatinine 0.44 - 1.00 mg/dL 9.56(O1.02(H) 1.30(Q1.07(H) 6.57(Q1.05(H)  Sodium 135 - 145 mmol/L 141 138 139  Potassium 3.5 - 5.1 mmol/L 3.0(L) 2.8(L) 3.0(L)  Chloride 98 - 111 mmol/L 114(H) 112(H) 112(H)  CO2 22 - 32 mmol/L 14(L) 14(L) 16(L)  Calcium 8.9 -  10.3 mg/dL 4.6(N8.7(L) 6.2(X8.6(L) 5.2(W8.6(L)  Total Protein 6.5 - 8.1 g/dL - 6.2(L) -  Total Bilirubin 0.3 - 1.2 mg/dL - 0.6 -  Alkaline Phos 38 - 126 U/L - 96 -  AST 15 - 41 U/L - 41 -  ALT 0 - 44 U/L - 27 -      Microbiology: Recent Results (from the past 240 hour(s))  Respiratory Panel by RT PCR (Flu A&B, Covid) - Nasopharyngeal Swab     Status: None   Collection Time: 04/29/20  3:05 PM   Specimen: Nasopharyngeal Swab  Result Value Ref Range Status   SARS Coronavirus 2 by RT PCR NEGATIVE NEGATIVE Final    Comment: (NOTE) SARS-CoV-2 target nucleic acids are NOT DETECTED.  The SARS-CoV-2 RNA is generally detectable in upper respiratoy specimens during the acute phase of infection. The lowest concentration of SARS-CoV-2 viral copies this assay can detect is 131 copies/mL. A negative result does not preclude SARS-Cov-2 infection and should not be used as the sole basis for treatment or other patient management decisions. A negative result may occur with  improper specimen collection/handling, submission of specimen other than nasopharyngeal swab, presence of viral mutation(s) within the areas targeted by this assay, and inadequate number of viral copies (<131 copies/mL). A negative result must be combined with clinical observations, patient history, and epidemiological information. The expected result is Negative.  Fact Sheet for Patients:  https://www.moore.com/https://www.fda.gov/media/142436/download  Fact Sheet for Healthcare Providers:  https://www.young.biz/https://www.fda.gov/media/142435/download  This test is no t yet approved or cleared by the [place] FDA and  has been authorized for detection and/or diagnosis of SARS-CoV-2  by FDA under an Emergency Use Authorization (EUA). This EUA will remain  in effect (meaning this test can be used) for the duration of the COVID-19 declaration under Section 564(b)(1) of the Act, 21 U.S.C. section 360bbb-3(b)(1), unless the authorization is terminated or revoked sooner.      Influenza A by PCR NEGATIVE NEGATIVE Final   Influenza B by PCR NEGATIVE NEGATIVE Final    Comment: (NOTE) The Xpert Xpress SARS-CoV-2/FLU/RSV assay is intended as an aid in  the diagnosis of influenza from Nasopharyngeal swab specimens and  should not be used as a sole basis for treatment. Nasal washings and  aspirates are unacceptable for Xpert Xpress SARS-CoV-2/FLU/RSV  testing.  Fact Sheet for Patients: https://www.moore.com/  Fact Sheet for Healthcare Providers: https://www.young.biz/  This test is not yet approved or cleared by the [place] FDA and  has been authorized for detection and/or diagnosis of SARS-CoV-2 by  FDA under an Emergency Use Authorization (EUA). This EUA will remain  in effect (meaning this test can be used) for the duration of the  Covid-19 declaration under Section 564(b)(1) of the Act, 21  U.S.C. section 360bbb-3(b)(1), unless the authorization is  terminated or revoked. Performed at [facility], [address] [place], [place] [address]   Blood culture (routine x 2)     Status: None (Preliminary result)   Collection Time: 04/29/20  3:39 PM   Specimen: BLOOD  Result Value Ref Range Status   Specimen Description BLOOD BLOOD LEFT FOREARM  Final   Special Requests   Final    BOTTLES DRAWN AEROBIC AND ANAEROBIC Blood Culture adequate volume   Culture   Final    NO GROWTH < 24 HOURS Performed at [facility], [address], [place], [place] [address]    Report Status PENDING  Incomplete  Blood culture (routine x 2)     Status: None (Preliminary result)   Collection Time: 04/29/20  3:44 PM   Specimen: BLOOD  Result Value Ref Range Status   Specimen Description BLOOD BLOOD RIGHT FOREARM  Final   Special Requests   Final    BOTTLES DRAWN AEROBIC AND ANAEROBIC Blood Culture adequate volume   Culture   Final    NO GROWTH < 24 HOURS Performed at [facility], [address] [place], [place] [address]    Report Status PENDING  Incomplete  Aerobic/Anaerobic Culture (surgical/deep wound)     Status: None (Preliminary result)   Collection Time: 04/30/20  2:35 AM   Specimen: PATH Other; Wound  Result Value Ref Range Status   Specimen Description   Final    PERIRECTAL ABSCESS Performed at [facility], [address] [place], [place] [address]    Special Requests   Final    NONE Performed at [facility], [address] [place], [place] [address]    Gram Stain   Final    ABUNDANT WBC PRESENT, PREDOMINANTLY PMN ABUNDANT GRAM POSITIVE COCCI ABUNDANT GRAM NEGATIVE RODS ABUNDANT GRAM NEGATIVE COCCI Performed at [facility], [address], [place], [place] [address]    Culture PENDING  Incomplete   Report Status PENDING  Incomplete  Aerobic/Anaerobic Culture (surgical/deep wound)     Status: None (Preliminary result)   Collection Time: 04/30/20  2:35 AM   Specimen: PATH Other; Wound  Result Value Ref Range Status   Specimen Description   Final    PERIRECTAL ABSCESS Performed at [facility], [address] [place], [place]  [address]    Special Requests   Final    NONE Performed at [facility], [address] [place], [place] [address]    Gram Stain   Final    MODERATE WBC PRESENT, PREDOMINANTLY PMN ABUNDANT GRAM NEGATIVE RODS ABUNDANT GRAM POSITIVE COCCI ABUNDANT GRAM NEGATIVE COCCI Performed at [facility], [address], [place], [place] [address]    Culture PENDING  Incomplete   Report Status PENDING  Incomplete  Aerobic/Anaerobic Culture (surgical/deep wound)     Status: None (Preliminary result)   Collection Time: 04/30/20  2:35 AM   Specimen: PATH Other; Tissue  Result Value Ref Range Status   Specimen Description   Final    PERIRECTAL ABSCESS Performed at [facility], [address], [place], [place] [address]    Special Requests   Final    NONE Performed at  [facility], [address] [place], [place] [address]    Gram Stain   Final    FEW WBC PRESENT, PREDOMINANTLY PMN ABUNDANT GRAM POSITIVE COCCI ABUNDANT GRAM NEGATIVE RODS Performed at [facility], [address] [place], [place] [address]    Culture PENDING  Incomplete   Report Status PENDING  Incomplete  MRSA PCR Screening     Status: None   Collection Time: 04/30/20  8:27 AM   Specimen: Nasal Mucosa; Nasopharyngeal  Result Value Ref Range Status   MRSA by PCR NEGATIVE NEGATIVE Final    Comment:        The GeneXpert MRSA Assay (FDA approved for NASAL specimens only), is one component of a comprehensive MRSA colonization surveillance program. It is not intended to diagnose MRSA infection nor to guide or monitor treatment for MRSA infections. Performed at [facility], [address] [place], [place] [address]     IMAGING RESULTS:CT pelvisSoft tissue gas and soft tissue infiltration demonstrated in the medial aspect of the right buttocks region extending to the gluteal crease, mostly inferior to the anus. Changes are consistent with cellulitis. The presence of soft tissue gas may indicate Fournier's gangrene. Small medial fluid collection in the superficial subcutaneous tissues measures 1.4 x 2.8 cm. This is consistent with a small abscess. Appearances are unchanged since the previous study. I have personally reviewed the films ? Impression/Recommendation ? Newly diagnosed Diabetes found to be in DKA On insulin drip  Necrotizing fascitis of the perianal/gluteal /perineal ( rt ) area- s/p excisional debridement Currently on vanco and zosyn and clinda- change vanco /clinda to linezolid  Severe leucocytosis secondary to the infection and DKA  AKI due to dehydration -much improved Increase in troponin- seen by cardiology Left calf ulcerating wound- r/o MRSA- send  culture   ? ? ___________________________________________________ Discussed with her nurse Note:  This document was prepared using Dragon voice recognition software and may include unintentional dictation errors.

## 2020-04-30 NOTE — Progress Notes (Signed)
PROGRESS NOTE    [facility]  WUX:[id number] DOB: [birth year] DOA: 04/29/2020 PCP: Patient, No Pcp Per   Follow up on DKA  Brief Narrative:  Michelle Stark is a 64 y.o. female with medical history significant of HTN, obesity. HLD. Presented with N/V/ increased thirst and fatigue. Patient was unaware she is diabetic Her BG per EMS was 404.  Upon arriving the hospital, her CO2 level was less than 7, she had severe metabolic acidosis.  Insulin drip was started.  Patient was also seen by general surgery for decubitus sacral wound, had surgical I&D performed on 10/20.  Currently covered with antibiotics with Zosyn and vancomycin.    Assessment & Plan:   Active Problems:   Hypertension   HLD (hyperlipidemia)   Obesity   DKA (diabetic ketoacidosis) (HCC)   AKI (acute kidney injury) (HCC)   Dehydration   Metabolic acidosis due to diabetes mellitus (HCC)   Sepsis (HCC)   Abscess and cellulitis of gluteal region  #1.  Uncontrolled type 2 diabetes with diabetic ketoacidosis. Patient still very sleepy today, confused.  Anion gap is not closed yet, still significant metabolic acidosis.  Continue insulin drip.  Added D5 to the IV fluids due to glucose less than 250.  Continue fluids.  2.  Hypokalemia. Receiving 40 mEq IV potassium, also added potassium into the IV fluids.  Monitor potassium level, also check magnesium level tomorrow.  3.  Sacrum decubitus ulcer wound with infection, sepsis. Status post surgical I&D, continue vancomycin and Zosyn.  We will also consult ID.  4.  Non-STEMI. Seen by cardiology, deemed to be secondary to acute disease from DKA.  No further work-up recommended by cardiology.  5.  Acute kidney injury. Continue IV fluids.  6.  Essential hypertension. Continue current regimen.  7.  Acute metabolic encephalopathy. Secondary to acute illness.  Will follow.        DVT prophylaxis: Lovenox Code Status: Full Family Communication: son  updated  .   Status is: Inpatient  Remains inpatient appropriate because:Inpatient level of care appropriate due to severity of illness   Dispo: The patient is from: Home              Anticipated d/c is to: Home              Anticipated d/c date is: 3 days              Patient currently is not medically stable to d/c.        I/O last 3 completed shifts: In: 2246.2 [I.V.:500; IV Piggyback:1746.2] Out: -  Total I/O In: 1371.2 [I.V.:96.9; IV Piggyback:1274.3] Out: -      Consultants:   ID  Procedures: I&D  Antimicrobials:  Zosyn and vancomycin.  Subjective: Patient is very sleepy after I&D.  She is arousable.  But confused. Spoke with the nurse, she does not have significant hypoxemia or shortness of breath.  No cough. No abdominal pain or nausea vomiting. No fever or chills.  Objective: Vitals:   04/30/20 1100 04/30/20 1200 04/30/20 1300 04/30/20 1400  BP: (!) 125/59 133/62 (!) 116/43 (!) 109/41  Pulse:   (!) 115 96  Resp: 20 19 19 18   Temp:    99.8 F (37.7 C)  TempSrc:    Oral  SpO2:   (!) 84% 100%  Weight:      Height:        Intake/Output Summary (Last 24 hours) at 04/30/2020 1531 Last data filed at 04/30/2020 1400 Gross  per 24 hour  Intake 3617.48 ml  Output --  Net 3617.48 ml   Filed Weights   04/29/20 1418 04/30/20 0828  Weight: 82.6 kg 103 kg    Examination:  General exam: Appears calm and comfortable  Respiratory system: Clear to auscultation. Respiratory effort normal. Cardiovascular system: S1 & S2 heard, RRR. No JVD, murmurs, rubs, gallops or clicks. No pedal edema. Gastrointestinal system: Abdomen is nondistended, soft and nontender. No organomegaly or masses felt. Normal bowel sounds heard. Central nervous system: Drowsy and confused.  No focal neurological deficits. Extremities: Symmetric  Skin: No rashes, lesions or ulcers     Data Reviewed: I have personally reviewed following labs and imaging studies  CBC: Recent  Labs  Lab 04/29/20 1411  WBC 35.6*  35.3*  NEUTROABS 31.1*  HGB 16.0*  16.0*  HCT 47.9*  47.2*  MCV 93.7  92.0  PLT 608*  594*   Basic Metabolic Panel: Recent Labs  Lab 04/29/20 1725 04/29/20 2151 04/30/20 0457 04/30/20 1032 04/30/20 1246  NA 134* 135 138 139 138  K 3.4* 3.4* 3.4* 3.0* 2.8*  CL 106 105 112* 112* 112*  CO2 <7* 11* 12* 16* 14*  GLUCOSE 527* 408* 275* 275* 227*  BUN 43* 47* 42* 40* 39*  CREATININE 1.37* 1.42* 1.11* 1.05* 1.07*  CALCIUM 8.1* 9.0 8.7* 8.6* 8.6*  MG 2.2  --   --   --   --   PHOS 4.4  --   --   --   --    GFR: Estimated Creatinine Clearance: 59.8 mL/min (A) (by C-G formula based on SCr of 1.07 mg/dL (H)). Liver Function Tests: Recent Labs  Lab 04/30/20 1246  AST 41  ALT 27  ALKPHOS 96  BILITOT 0.6  PROT 6.2*  ALBUMIN 2.4*   Recent Labs  Lab 04/29/20 1725  LIPASE 90*   No results for input(s): AMMONIA in the last 168 hours. Coagulation Profile: Recent Labs  Lab 04/29/20 2155  INR 1.3*   Cardiac Enzymes: Recent Labs  Lab 04/29/20 1725  CKTOTAL 219   BNP (last 3 results) No results for input(s): PROBNP in the last 8760 hours. HbA1C: No results for input(s): HGBA1C in the last 72 hours. CBG: Recent Labs  Lab 04/30/20 1013 04/30/20 1115 04/30/20 1251 04/30/20 1337 04/30/20 1443  GLUCAP 208* 233* 212* 195* 169*   Lipid Profile: Recent Labs    04/30/20 0457  CHOL 266*  HDL 26*  LDLCALC 167*  TRIG 367*  CHOLHDL 10.2   Thyroid Function Tests: Recent Labs    04/29/20 2155  TSH 0.719   Anemia Panel: No results for input(s): VITAMINB12, FOLATE, FERRITIN, TIBC, IRON, RETICCTPCT in the last 72 hours. Sepsis Labs: Recent Labs  Lab 04/29/20 1725 04/29/20 1947 04/29/20 2155 04/30/20 0457  PROCALCITON 1.22  --   --   --   LATICACIDVEN  --  2.0* 2.5* 1.5    Recent Results (from the past 240 hour(s))  Respiratory Panel by RT PCR (Flu A&B, Covid) - Nasopharyngeal Swab     Status: None   Collection  Time: 04/29/20  3:05 PM   Specimen: Nasopharyngeal Swab  Result Value Ref Range Status   SARS Coronavirus 2 by RT PCR NEGATIVE NEGATIVE Final    Comment: (NOTE) SARS-CoV-2 target nucleic acids are NOT DETECTED.  The SARS-CoV-2 RNA is generally detectable in upper respiratoy specimens during the acute phase of infection. The lowest concentration of SARS-CoV-2 viral copies this assay can detect is 131 copies/mL. A  negative result does not preclude SARS-Cov-2 infection and should not be used as the sole basis for treatment or other patient management decisions. A negative result may occur with  improper specimen collection/handling, submission of specimen other than nasopharyngeal swab, presence of viral mutation(s) within the areas targeted by this assay, and inadequate number of viral copies (<131 copies/mL). A negative result must be combined with clinical observations, patient history, and epidemiological information. The expected result is Negative.  Fact Sheet for Patients:  https://www.moore.com/  Fact Sheet for Healthcare Providers:  https://www.young.biz/  This test is no t yet approved or cleared by the [place] FDA and  has been authorized for detection and/or diagnosis of SARS-CoV-2 by FDA under an Emergency Use Authorization (EUA). This EUA will remain  in effect (meaning this test can be used) for the duration of the COVID-19 declaration under Section 564(b)(1) of the Act, 21 U.S.C. section 360bbb-3(b)(1), unless the authorization is terminated or revoked sooner.     Influenza A by PCR NEGATIVE NEGATIVE Final   Influenza B by PCR NEGATIVE NEGATIVE Final    Comment: (NOTE) The Xpert Xpress SARS-CoV-2/FLU/RSV assay is intended as an aid in  the diagnosis of influenza from Nasopharyngeal swab specimens and  should not be used as a sole basis for treatment. Nasal washings and  aspirates are unacceptable for Xpert Xpress  SARS-CoV-2/FLU/RSV  testing.  Fact Sheet for Patients: https://www.moore.com/  Fact Sheet for Healthcare Providers: https://www.young.biz/  This test is not yet approved or cleared by the [place] FDA and  has been authorized for detection and/or diagnosis of SARS-CoV-2 by  FDA under an Emergency Use Authorization (EUA). This EUA will remain  in effect (meaning this test can be used) for the duration of the  Covid-19 declaration under Section 564(b)(1) of the Act, 21  U.S.C. section 360bbb-3(b)(1), unless the authorization is  terminated or revoked. Performed at [facility], [address] [place], [place] [address]   Blood culture (routine x 2)     Status: None (Preliminary result)   Collection Time: 04/29/20  3:39 PM   Specimen: BLOOD  Result Value Ref Range Status   Specimen Description BLOOD BLOOD LEFT FOREARM  Final   Special Requests   Final    BOTTLES DRAWN AEROBIC AND ANAEROBIC Blood Culture adequate volume   Culture   Final    NO GROWTH < 24 HOURS Performed at [facility], [address], [place], [place] [address]    Report Status PENDING  Incomplete  Blood culture (routine x 2)     Status: None (Preliminary result)   Collection Time: 04/29/20  3:44 PM   Specimen: BLOOD  Result Value Ref Range Status   Specimen Description BLOOD BLOOD RIGHT FOREARM  Final   Special Requests   Final    BOTTLES DRAWN AEROBIC AND ANAEROBIC Blood Culture adequate volume   Culture   Final    NO GROWTH < 24 HOURS Performed at [facility], [address], [place], [place] [address]    Report Status PENDING  Incomplete  Aerobic/Anaerobic Culture (surgical/deep wound)     Status: None (Preliminary result)   Collection Time: 04/30/20  2:35 AM   Specimen: PATH Other; Wound  Result Value Ref Range Status   Specimen Description   Final    PERIRECTAL ABSCESS Performed at [facility], [address], [place], [place] [address]    Special Requests   Final    NONE Performed at [facility], [address] [place], [place] [address]    Gram Stain   Final    ABUNDANT WBC PRESENT, PREDOMINANTLY PMN ABUNDANT GRAM POSITIVE COCCI ABUNDANT GRAM NEGATIVE RODS ABUNDANT GRAM NEGATIVE COCCI Performed at [facility], [address] [place], [place] [address]    Culture PENDING  Incomplete   Report Status PENDING  Incomplete  Aerobic/Anaerobic Culture (surgical/deep wound)     Status: None (Preliminary result)   Collection Time: 04/30/20  2:35 AM   Specimen: PATH Other; Wound  Result Value Ref Range Status   Specimen Description   Final    PERIRECTAL ABSCESS Performed at [facility], [address] [place], [place] [address]    Special Requests   Final    NONE Performed at [facility], [address] [place], [place] [address]    Gram Stain   Final    MODERATE WBC PRESENT, PREDOMINANTLY PMN ABUNDANT GRAM NEGATIVE RODS ABUNDANT GRAM POSITIVE COCCI ABUNDANT GRAM NEGATIVE COCCI Performed at [facility], [address] [place], [place] [address]    Culture PENDING  Incomplete   Report Status PENDING  Incomplete  Aerobic/Anaerobic Culture (surgical/deep wound)     Status: None (Preliminary result)   Collection Time: 04/30/20  2:35 AM   Specimen: PATH Other; Tissue  Result Value Ref Range Status   Specimen Description   Final    PERIRECTAL ABSCESS Performed at [facility], [address] [place], [place] [address]    Special Requests   Final    NONE Performed at [facility], [address] [place], [place] [address]    Gram Stain   Final    FEW WBC PRESENT, PREDOMINANTLY PMN ABUNDANT GRAM POSITIVE COCCI ABUNDANT GRAM NEGATIVE RODS Performed at [facility], [address] [place], [place] [address]    Culture PENDING  Incomplete   Report Status PENDING  Incomplete  MRSA PCR Screening     Status:  None   Collection Time: 04/30/20  8:27 AM   Specimen: Nasal Mucosa; Nasopharyngeal  Result Value Ref Range Status   MRSA by PCR NEGATIVE NEGATIVE Final    Comment:        The GeneXpert MRSA Assay (FDA approved for NASAL specimens only), is one component of a comprehensive MRSA colonization surveillance program. It is not intended to diagnose MRSA infection nor to guide or monitor treatment for MRSA infections. Performed at [facility], [address] [place], [place] [address]          Radiology Studies: CT PELVIS WO CONTRAST  Result Date: 04/29/2020 CLINICAL DATA:  Pelvic abscess EXAM: CT PELVIS WITHOUT CONTRAST TECHNIQUE: Multidetector CT imaging of the pelvis was performed following the standard protocol without intravenous contrast. COMPARISON:  04/29/2020 at 1847 hours FINDINGS: Urinary Tract: Residual contrast material fills the bladder. No bladder wall thickening or filling defect. Bowel: Visualized portions of the small bowel and colon are normal in appearance. The appendix is normal. Vascular/Lymphatic: Few scattered calcifications in the visualized iliac and external iliac arteries. No aneurysm. Reproductive: Uterus and ovaries are not enlarged. No free fluid in the pelvis. Other: Soft tissue gas and soft tissue infiltration demonstrated in the medial aspect of the right buttocks region extending to the gluteal crease, mostly inferior to the anus. Changes are consistent with cellulitis. The presence of soft tissue gas may indicate Fournier's gangrene. Small medial fluid collection in the superficial subcutaneous tissues measures 1.4 x 2.8 cm. This is  consistent with a small abscess. Appearances are unchanged since the previous study. Musculoskeletal: Degenerative changes in the lower lumbar spine. No focal bone lesion or bone destruction. IMPRESSION: 1. Right medial gluteal soft tissue infiltration and soft tissue gas consistent with cellulitis, possibly representing  Fournier's gangrene. 2. Small subcutaneous abscess. 3. No change since prior study. Electronically Signed   By: Burman Nieves M.D.   On: 04/29/2020 23:31   CT ABDOMEN PELVIS W CONTRAST  Addendum Date: 04/29/2020   ADDENDUM REPORT: 04/29/2020 21:39 ADDENDUM: After speaking with Dr. Katrinka Blazing in the ER, it is noted that there is stranding and gas within the medial right buttock subcutaneous soft tissues near the gluteal cleft concerning for cellulitis/infection with possible developing abscess. These results were discussed by telephone at the time of interpretation on 04/29/2020 at 9:39 pm to provider [facility] , who verbally acknowledged these results. Electronically Signed   By: Charlett Nose M.D.   On: 04/29/2020 21:39   Result Date: 04/29/2020 CLINICAL DATA:  Acute nonlocalized abdominal pain. Recent passage of left renal stone. Diabetic ketoacidosis with leukocytosis and hypothermia. EXAM: CT ABDOMEN AND PELVIS WITH CONTRAST TECHNIQUE: Multidetector CT imaging of the abdomen and pelvis was performed using the standard protocol following bolus administration of intravenous contrast. CONTRAST:  75mL OMNIPAQUE IOHEXOL 300 MG/ML  SOLN COMPARISON:  None. FINDINGS: Lower chest: Lung bases are clear.  No pleural or pericardial fluid. Hepatobiliary: Liver parenchyma is normal. Previous cholecystectomy. Pancreas: Normal Spleen: Normal Adrenals/Urinary Tract: Adrenal glands are normal. Kidneys are normal. No cyst, mass, stone or hydronephrosis. No stone along the course of either ureter. There is phlebolith or a small left ovarian calcification at the left pelvic brim adjacent to the ureter. No stone in the bladder. Stomach/Bowel: Stomach, small intestine and colon appear unremarkable. Vascular/Lymphatic: Aortic atherosclerosis. No aneurysm. IVC is normal. No retroperitoneal adenopathy. Reproductive: No pelvic mass. Uterus and adnexal regions appear normal. Other: No free fluid or air. Musculoskeletal: Lower lumbar  degenerative changes with a degree of stenosis at L4-5. IMPRESSION: 1. No acute finding. No evidence of urinary tract stone disease or other urinary tract pathology. Phlebolith or small left ovarian calcification at the left pelvic brim is adjacent to the ureter and simulates a ureteral stone. 2. Previous cholecystectomy. 3. 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 4. Aortic atherosclerosis. Aortic Atherosclerosis (ICD10-I70.0). Electronically Signed: By: Paulina Fusi M.D. On: 04/29/2020 19:10   DG Chest Portable 1 View  Result Date: 04/29/2020 CLINICAL DATA:  Tachypnea.  Shortness of breath.  Diabetes. EXAM: PORTABLE CHEST 1 VIEW COMPARISON:  None. FINDINGS: Artifact overlies the chest. Heart size is normal. Aortic atherosclerotic calcification. The pulmonary vascularity is normal. The lungs are clear. No effusions. No acute bone finding. IMPRESSION: No active disease. Aortic atherosclerotic calcification. Electronically Signed   By: Paulina Fusi M.D.   On: 04/29/2020 15:49        Scheduled Meds: . Chlorhexidine Gluconate Cloth  6 each Topical Daily  . sodium hypochlorite   Irrigation Once   Continuous Infusions: . albumin human Stopped (04/30/20 0520)  . clindamycin (CLEOCIN) IV Stopped (04/30/20 1332)  . dextrose 5 % and 0.45 % NaCl with KCl 20 mEq/L    . insulin 3.8 Units/hr (04/30/20 1444)  . piperacillin-tazobactam (ZOSYN)  IV 12.5 mL/hr at 04/30/20 1400  . potassium chloride 10 mEq (04/30/20 1451)  . potassium chloride    . vancomycin Stopped (04/30/20 1256)     LOS: 0 days  Time spent: 38 minutes    Marrion Coy, MD [facility]   To contact the attending provider between 7A-7P or the covering provider during after hours 7P-7A, please log into the web site [email or web address] and access using universal [place] password for that web site. If you do not have the password, please call the hospital operator.  04/30/2020, 3:31 PM

## 2020-05-01 ENCOUNTER — Encounter: Payer: Self-pay | Admitting: Surgery

## 2020-05-01 DIAGNOSIS — E872 Acidosis: Secondary | ICD-10-CM

## 2020-05-01 DIAGNOSIS — L0231 Cutaneous abscess of buttock: Secondary | ICD-10-CM

## 2020-05-01 DIAGNOSIS — L03317 Cellulitis of buttock: Secondary | ICD-10-CM

## 2020-05-01 DIAGNOSIS — E1169 Type 2 diabetes mellitus with other specified complication: Secondary | ICD-10-CM

## 2020-05-01 DIAGNOSIS — I214 Non-ST elevation (NSTEMI) myocardial infarction: Secondary | ICD-10-CM

## 2020-05-01 DIAGNOSIS — R000 Tachycardia, unspecified: Secondary | ICD-10-CM

## 2020-05-01 DIAGNOSIS — A419 Sepsis, unspecified organism: Secondary | ICD-10-CM

## 2020-05-01 LAB — CBC WITH DIFFERENTIAL/PLATELET
Abs Immature Granulocytes: 0.11 10*3/uL — ABNORMAL HIGH (ref 0.00–0.07)
Basophils Absolute: 0 10*3/uL (ref 0.0–0.1)
Basophils Relative: 0 %
Eosinophils Absolute: 0 10*3/uL (ref 0.0–0.5)
Eosinophils Relative: 0 %
HCT: 32.6 % — ABNORMAL LOW (ref 36.0–46.0)
Hemoglobin: 11.6 g/dL — ABNORMAL LOW (ref 12.0–15.0)
Immature Granulocytes: 1 %
Lymphocytes Relative: 9 %
Lymphs Abs: 1.3 10*3/uL (ref 0.7–4.0)
MCH: 31.4 pg (ref 26.0–34.0)
MCHC: 35.6 g/dL (ref 30.0–36.0)
MCV: 88.1 fL (ref 80.0–100.0)
Monocytes Absolute: 0.8 10*3/uL (ref 0.1–1.0)
Monocytes Relative: 6 %
Neutro Abs: 11.5 10*3/uL — ABNORMAL HIGH (ref 1.7–7.7)
Neutrophils Relative %: 84 %
Platelets: 284 10*3/uL (ref 150–400)
RBC: 3.7 MIL/uL — ABNORMAL LOW (ref 3.87–5.11)
RDW: 13.4 % (ref 11.5–15.5)
WBC: 13.8 10*3/uL — ABNORMAL HIGH (ref 4.0–10.5)
nRBC: 0 % (ref 0.0–0.2)

## 2020-05-01 LAB — BASIC METABOLIC PANEL
Anion gap: 8 (ref 5–15)
Anion gap: 11 (ref 5–15)
Anion gap: 9 (ref 5–15)
BUN: 36 mg/dL — ABNORMAL HIGH (ref 8–23)
BUN: 31 mg/dL — ABNORMAL HIGH (ref 8–23)
BUN: 25 mg/dL — ABNORMAL HIGH (ref 8–23)
CO2: 16 mmol/L — ABNORMAL LOW (ref 22–32)
CO2: 15 mmol/L — ABNORMAL LOW (ref 22–32)
CO2: 17 mmol/L — ABNORMAL LOW (ref 22–32)
Calcium: 8.4 mg/dL — ABNORMAL LOW (ref 8.9–10.3)
Calcium: 8.3 mg/dL — ABNORMAL LOW (ref 8.9–10.3)
Calcium: 8.3 mg/dL — ABNORMAL LOW (ref 8.9–10.3)
Chloride: 118 mmol/L — ABNORMAL HIGH (ref 98–111)
Chloride: 114 mmol/L — ABNORMAL HIGH (ref 98–111)
Chloride: 116 mmol/L — ABNORMAL HIGH (ref 98–111)
Creatinine, Ser: 0.93 mg/dL (ref 0.44–1.00)
Creatinine, Ser: 0.87 mg/dL (ref 0.44–1.00)
Creatinine, Ser: 0.8 mg/dL (ref 0.44–1.00)
GFR, Estimated: >60 mL/min (ref >60)
GFR, Estimated: >60 mL/min (ref >60)
GFR, Estimated: >60 mL/min (ref >60)
Glucose, Bld: 175 mg/dL — ABNORMAL HIGH (ref 70–99)
Glucose, Bld: 186 mg/dL — ABNORMAL HIGH (ref 70–99)
Glucose, Bld: 199 mg/dL — ABNORMAL HIGH (ref 70–99)
Potassium: 3.2 mmol/L — ABNORMAL LOW (ref 3.5–5.1)
Potassium: 3.5 mmol/L (ref 3.5–5.1)
Potassium: 3.4 mmol/L — ABNORMAL LOW (ref 3.5–5.1)
Sodium: 142 mmol/L (ref 135–145)
Sodium: 140 mmol/L (ref 135–145)
Sodium: 142 mmol/L (ref 135–145)

## 2020-05-01 LAB — BETA-HYDROXYBUTYRIC ACID
Beta-Hydroxybutyric Acid: 0.46 mmol/L — ABNORMAL HIGH (ref 0.05–0.27)
Beta-Hydroxybutyric Acid: 0.84 mmol/L — ABNORMAL HIGH (ref 0.05–0.27)
Beta-Hydroxybutyric Acid: 0.1 mmol/L (ref 0.05–0.27)
Beta-Hydroxybutyric Acid: <0.05 mmol/L — ABNORMAL LOW (ref 0.05–0.27)

## 2020-05-01 LAB — GLUCOSE, CAPILLARY
Glucose-Capillary: 127 mg/dL — ABNORMAL HIGH (ref 70–99)
Glucose-Capillary: 141 mg/dL — ABNORMAL HIGH (ref 70–99)
Glucose-Capillary: 150 mg/dL — ABNORMAL HIGH (ref 70–99)
Glucose-Capillary: 158 mg/dL — ABNORMAL HIGH (ref 70–99)
Glucose-Capillary: 159 mg/dL — ABNORMAL HIGH (ref 70–99)
Glucose-Capillary: 166 mg/dL — ABNORMAL HIGH (ref 70–99)
Glucose-Capillary: 168 mg/dL — ABNORMAL HIGH (ref 70–99)
Glucose-Capillary: 168 mg/dL — ABNORMAL HIGH (ref 70–99)
Glucose-Capillary: 170 mg/dL — ABNORMAL HIGH (ref 70–99)
Glucose-Capillary: 170 mg/dL — ABNORMAL HIGH (ref 70–99)
Glucose-Capillary: 170 mg/dL — ABNORMAL HIGH (ref 70–99)
Glucose-Capillary: 173 mg/dL — ABNORMAL HIGH (ref 70–99)
Glucose-Capillary: 176 mg/dL — ABNORMAL HIGH (ref 70–99)
Glucose-Capillary: 180 mg/dL — ABNORMAL HIGH (ref 70–99)
Glucose-Capillary: 183 mg/dL — ABNORMAL HIGH (ref 70–99)
Glucose-Capillary: 188 mg/dL — ABNORMAL HIGH (ref 70–99)
Glucose-Capillary: 193 mg/dL — ABNORMAL HIGH (ref 70–99)
Glucose-Capillary: 231 mg/dL — ABNORMAL HIGH (ref 70–99)

## 2020-05-01 LAB — URINE CULTURE

## 2020-05-01 LAB — TROPONIN I (HIGH SENSITIVITY): Troponin I (High Sensitivity): 2056 ng/L (ref <18)

## 2020-05-01 LAB — C-PEPTIDE: C-Peptide: 1.1 ng/mL (ref 1.1–4.4)

## 2020-05-01 LAB — HEMOGLOBIN A1C
Hgb A1c MFr Bld: 12.4 % — ABNORMAL HIGH (ref 4.8–5.6)
Mean Plasma Glucose: 309 mg/dL

## 2020-05-01 LAB — MAGNESIUM: Magnesium: 1.8 mg/dL (ref 1.7–2.4)

## 2020-05-01 MED ORDER — POTASSIUM CHLORIDE 20 MEQ PO PACK
40.0000 meq | PACK | Freq: Once | ORAL | Status: AC
  Administered 2020-05-01: 40 meq via ORAL
  Filled 2020-05-01: qty 2

## 2020-05-01 MED ORDER — ATORVASTATIN CALCIUM 20 MG PO TABS
20.0000 mg | ORAL_TABLET | Freq: Every day | ORAL | Status: DC
  Administered 2020-05-01 – 2020-05-11 (×11): 20 mg via ORAL
  Filled 2020-05-01 (×11): qty 1

## 2020-05-01 MED ORDER — ADULT MULTIVITAMIN W/MINERALS CH
1.0000 | ORAL_TABLET | Freq: Every day | ORAL | Status: DC
  Administered 2020-05-03 – 2020-05-08 (×6): 1 via ORAL
  Filled 2020-05-01 (×6): qty 1

## 2020-05-01 MED ORDER — KCL-LACTATED RINGERS-D5W 20 MEQ/L IV SOLN
INTRAVENOUS | Status: DC
  Filled 2020-05-01 (×6): qty 1000

## 2020-05-01 MED ORDER — SODIUM BICARBONATE 650 MG PO TABS
1300.0000 mg | ORAL_TABLET | Freq: Two times a day (BID) | ORAL | Status: DC
  Administered 2020-05-01 – 2020-05-03 (×4): 1300 mg via ORAL
  Filled 2020-05-01 (×5): qty 2

## 2020-05-01 MED ORDER — METOPROLOL SUCCINATE ER 25 MG PO TB24
12.5000 mg | ORAL_TABLET | Freq: Every day | ORAL | Status: DC
  Administered 2020-05-01 – 2020-05-12 (×11): 12.5 mg via ORAL
  Filled 2020-05-01 (×4): qty 1
  Filled 2020-05-01: qty 0.5
  Filled 2020-05-01 (×5): qty 1
  Filled 2020-05-01: qty 0.5
  Filled 2020-05-01: qty 1

## 2020-05-01 MED ORDER — FAMOTIDINE 20 MG PO TABS
20.0000 mg | ORAL_TABLET | Freq: Every day | ORAL | Status: DC
  Administered 2020-05-01 – 2020-05-12 (×11): 20 mg via ORAL
  Filled 2020-05-01 (×12): qty 1

## 2020-05-01 MED ORDER — ASPIRIN EC 81 MG PO TBEC
81.0000 mg | DELAYED_RELEASE_TABLET | Freq: Every day | ORAL | Status: DC
  Administered 2020-05-01 – 2020-05-12 (×11): 81 mg via ORAL
  Filled 2020-05-01 (×12): qty 1

## 2020-05-01 MED ORDER — POTASSIUM CHLORIDE 10 MEQ/100ML IV SOLN
10.0000 meq | INTRAVENOUS | Status: AC
  Administered 2020-05-01 (×2): 10 meq via INTRAVENOUS
  Filled 2020-05-01 (×2): qty 100

## 2020-05-01 MED ORDER — ENSURE MAX PROTEIN PO LIQD
11.0000 [oz_av] | Freq: Two times a day (BID) | ORAL | Status: DC
  Administered 2020-05-02 – 2020-05-11 (×13): 11 [oz_av] via ORAL
  Filled 2020-05-01: qty 330

## 2020-05-01 MED ORDER — ALUM & MAG HYDROXIDE-SIMETH 200-200-20 MG/5ML PO SUSP
30.0000 mL | ORAL | Status: DC | PRN
  Administered 2020-05-01 – 2020-05-06 (×5): 30 mL via ORAL
  Filled 2020-05-01 (×5): qty 30

## 2020-05-01 MED ORDER — KCL-LACTATED RINGERS-D5W 20 MEQ/L IV SOLN: INTRAVENOUS | Status: DC

## 2020-05-01 NOTE — Progress Notes (Signed)
Inpatient Diabetes Program Recommendations  AACE/ADA: New Consensus Statement on Inpatient Glycemic Control (2015)  Target Ranges:  Prepandial:   less than 140 mg/dL      Peak postprandial:   less than 180 mg/dL (1-2 hours)      Critically ill patients:  140 - 180 mg/dL   Results for Michelle Stark, Michelle Stark (MRN [id number]) as of 05/01/2020 08:07  Ref. Range 04/29/2020 17:25  Sodium Latest Ref Range: 135 - 145 mmol/L 134 (L)  Potassium Latest Ref Range: 3.5 - 5.1 mmol/L 3.4 (L)  Chloride Latest Ref Range: 98 - 111 mmol/L 106  CO2 Latest Ref Range: 22 - 32 mmol/L <7 (L)  Glucose Latest Ref Range: 70 - 99 mg/dL 242 (HH)  BUN Latest Ref Range: 8 - 23 mg/dL 43 (H)  Creatinine Latest Ref Range: 0.44 - 1.00 mg/dL 6.83 (H)  Calcium Latest Ref Range: 8.9 - 10.3 mg/dL 8.1 (L)  Anion gap Latest Ref Range: 5 - 15  NOT CALCULATED   Results for Michelle Stark, Michelle Stark (MRN [id number]) as of 05/01/2020 08:07  Ref. Range 04/30/2020 23:18 05/01/2020 00:25 05/01/2020 01:45 05/01/2020 03:11 05/01/2020 05:23 05/01/2020 06:30 05/01/2020 07:22    IV Insulin DripGlucose-Capillary Latest Ref Range: 70 - 99 mg/dL 989 (H)  IV Insulin Drip 166 (H)  IV Insulin Drip 159 (H)  IV Insulin Drip 150 (H)  IV Insulin Drip  127 (H)  IV Insulin Drip 141 (H)  IV Insulin Drip 168 (H)  IV Insulin Drip    Admit with: DKA/ New Diagnosis of Diabetes/ Necrotizing fascitis of the perianal/gluteal /perineal    Current Orders: IV Insulin Drip       MD- Note BMET from 12:43am showed improved Anion Gap (down to 8), however, CO2 at that time was only 16.  Recommend IV Insulin Drip be continued until CO2 level closer to 20.  Please check BMET this AM  When ready to transition to SQ Insulin, please make sure Lantus given at least 1 hour prior to d/c of the IV Insulin Drip      --Will follow patient during hospitalization--  Ambrose Finland RN, MSN, CDE Diabetes Coordinator Inpatient Glycemic Control  Team Team Pager: [phone] (8a-5p)

## 2020-05-01 NOTE — Progress Notes (Signed)
Progress Note  Patient Name: Michelle Stark Date of Encounter: 05/01/2020  Primary Cardiologist: New to [facility] - consult by Agbor-Etang  Subjective   Never with chest pain or dyspnea.  Echo demonstrated preserved LV SF with no regional wall motion and normalities.  EKG nonacute.  Inpatient Medications    Scheduled Meds: . Chlorhexidine Gluconate Cloth  6 each Topical Daily  . enoxaparin (LOVENOX) injection  0.5 mg/kg Subcutaneous Q24H  . famotidine  20 mg Oral Daily  . sodium bicarbonate  1,300 mg Oral BID  . sodium hypochlorite   Irrigation Once   Continuous Infusions: . albumin human Stopped (04/30/20 0520)  . dextrose 5 % and 0.45 % NaCl with KCl 20 mEq/L Stopped (05/01/20 0251)  . insulin 4.4 Units/hr (05/01/20 0846)  . linezolid (ZYVOX) IV Stopped (04/30/20 2256)  . piperacillin-tazobactam (ZOSYN)  IV 3.375 g (05/01/20 0542)  . potassium chloride 10 mEq (05/01/20 0847)   PRN Meds: alum & mag hydroxide-simeth, dextrose, oxyCODONE-acetaminophen   Vital Signs    Vitals:   05/01/20 0400 05/01/20 0500 05/01/20 0600 05/01/20 0752  BP: 125/68 136/78  129/68  Pulse: 93 89 89 89  Resp: (!) 23 (!) 37 20 20  Temp:    98.3 F (36.8 C)  TempSrc:    Oral  SpO2: 99% 97% 97% 97%  Weight:      Height:        Intake/Output Summary (Last 24 hours) at 05/01/2020 0930 Last data filed at 05/01/2020 0329 Gross per 24 hour  Intake 3304.7 ml  Output 1825 ml  Net 1479.7 ml   Filed Weights   04/29/20 1418 04/30/20 0828  Weight: 82.6 kg 103 kg    Telemetry    SR - Personally Reviewed  ECG    No new tracings - Personally Reviewed  Physical Exam   GEN: No acute distress.   Neck: No JVD. Cardiac: RRR, no murmurs, rubs, or gallops.  Respiratory: Clear to auscultation bilaterally.  GI: Soft, nontender, non-distended.   MS: No edema; No deformity. Neuro:  Alert and oriented x 3; Nonfocal.  Psych: Normal affect.  Labs    Chemistry Recent Labs  Lab  04/30/20 1246 04/30/20 1246 04/30/20 1625 04/30/20 2058 05/01/20 0043  NA 138   < > 141 142 142  K 2.8*   < > 3.0* 2.9* 3.2*  CL 112*   < > 114* 115* 118*  CO2 14*   < > 14* 16* 16*  GLUCOSE 227*   < > 156* 202* 175*  BUN 39*   < > 39* 38* 36*  CREATININE 1.07*   < > 1.02* 0.98 0.93  CALCIUM 8.6*   < > 8.7* 8.6* 8.4*  PROT 6.2*  --   --   --   --   ALBUMIN 2.4*  --   --   --   --   AST 41  --   --   --   --   ALT 27  --   --   --   --   ALKPHOS 96  --   --   --   --   BILITOT 0.6  --   --   --   --   GFRNONAA 55*   < > 58* >60 >60  ANIONGAP 12   < > 13 11 8    < > = values in this interval not displayed.     Hematology Recent Labs  Lab 04/29/20 1411 04/30/20 2058 05/01/20  0728  WBC 35.6*  35.3* 16.3* 13.8*  RBC 5.11  5.13* 4.05 3.70*  HGB 16.0*  16.0* 12.7 11.6*  HCT 47.9*  47.2* 36.6 32.6*  MCV 93.7  92.0 90.4 88.1  MCH 31.3  31.2 31.4 31.4  MCHC 33.4  33.9 34.7 35.6  RDW 14.6  14.1 13.4 13.4  PLT 608*  594* 318 284    Cardiac EnzymesNo results for input(s): TROPONINI in the last 168 hours. No results for input(s): TROPIPOC in the last 168 hours.   BNPNo results for input(s): BNP, PROBNP in the last 168 hours.   DDimer No results for input(s): DDIMER in the last 168 hours.   Radiology    CT PELVIS WO CONTRAST  Result Date: 04/29/2020 IMPRESSION: 1. Right medial gluteal soft tissue infiltration and soft tissue gas consistent with cellulitis, possibly representing Fournier's gangrene. 2. Small subcutaneous abscess. 3. No change since prior study. Electronically Signed   By: Burman Nieves M.D.   On: 04/29/2020 23:31   CT ABDOMEN PELVIS W CONTRAST  Addendum Date: 04/29/2020   ADDENDUM REPORT: 04/29/2020 21:39 ADDENDUM: After speaking with Dr. Katrinka Blazing in the ER, it is noted that there is stranding and gas within the medial right buttock subcutaneous soft tissues near the gluteal cleft concerning for cellulitis/infection with possible developing abscess.  These results were discussed by telephone at the time of interpretation on 04/29/2020 at 9:39 pm to provider [facility] , who verbally acknowledged these results. Electronically Signed   By: Charlett Nose M.D.   On: 04/29/2020 21:39   Result Date: 04/29/2020 IMPRESSION: 1. No acute finding. No evidence of urinary tract stone disease or other urinary tract pathology. Phlebolith or small left ovarian calcification at the left pelvic brim is adjacent to the ureter and simulates a ureteral stone. 2. Previous cholecystectomy. 3. 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 4. Aortic atherosclerosis. Aortic Atherosclerosis (ICD10-I70.0). Electronically Signed: By: Paulina Fusi M.D. On: 04/29/2020 19:10   DG Chest Portable 1 View  Result Date: 04/29/2020 IMPRESSION: No active disease. Aortic atherosclerotic calcification. Electronically Signed   By: Paulina Fusi M.D.   On: 04/29/2020 15:49    Cardiac Studies   2D echo 04/30/2020: 1. Left ventricular ejection fraction, by estimation, is 60 to 65%. The  left ventricle has normal function. The left ventricle has no regional  wall motion abnormalities. Left ventricular diastolic parameters were  normal.  2. Right ventricular systolic function is normal. The right ventricular  size is normal.  3. The mitral valve is normal in structure. No evidence of mitral valve  regurgitation.  4. The aortic valve is grossly normal. Aortic valve regurgitation is not  visualized.  5. The inferior vena cava is normal in size with greater than 50%  respiratory variability, suggesting right atrial pressure of 3 mmHg.  Patient Profile     63 y.o. 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  Assessment & Plan    1. Elevated HS-Tn: -Patient never with chest pain -Consistent with supply demand ischemia in the setting of DKA, sacral decubitus ulcer with sepsis, and AKI -Trend troponin until peak -No indication for heparin drip at this time -Echo with preserved  LVSF and normal wall motion  -EKG not acute -Consider outpatient ischemic workup once she is improved from her acute infection -Recommend ASA if okay from a surgical perspective -Statin should be considered  2. HLD: -LDL 167 this admission -Recommend adding statin prior to discharge  For questions or updates, please contact [facility] Please consult [email or web address] for contact info under Cardiology/STEMI.    Signed, Eula Listen, PA-C [facility] Pager: [phone] 05/01/2020, 9:30 AM

## 2020-05-01 NOTE — Progress Notes (Signed)
Necrotizing infectionPOD # 1 Cultures ABUNDANT GRAM POSITIVE COCCI  ABUNDANT GRAM NEGATIVE RODS And fungi Proving clinically. Non-STEMI Patient is improving  PE NAD Wound significant improvement, no crepitus no undrained pockets. dressing replaced.  A/ p For another debridement and possible wound vac in am Continue A/bs

## 2020-05-01 NOTE — Progress Notes (Addendum)
PROGRESS NOTE    Michelle Stark  RVU:[id number] DOB: 12-06-1955 DOA: 04/29/2020 PCP: Patient, No Pcp Per   Chief complaint.  Follow-up on DKA. Brief Narrative:  Michelle Marrone Newsomeis a 64 y.o.femalewith medical history significant of HTN, obesity. HLD. Presented withN/V/ increased thirst and fatigue. Patient was unaware she is diabetic Her BG per EMS was 404.  Upon arriving the hospital, her CO2 level was less than 7, she had severe metabolic acidosis.  Insulin drip was started.  Patient was also seen by general surgery for decubitus sacral wound, had surgical I&D performed on 10/20.  Currently covered with antibiotics with Zosyn and vancomycin.    Assessment & Plan:   Active Problems:   Hypertension   HLD (hyperlipidemia)   Obesity   DKA (diabetic ketoacidosis) (HCC)   AKI (acute kidney injury) (HCC)   Dehydration   Metabolic acidosis due to diabetes mellitus (HCC)   Sepsis (HCC)   Abscess and cellulitis of gluteal region   Elevated troponin  #1.  Uncontrolled type 2 diabetes with diabetes ketoacidosis. Anion gap has closed since last night.  However, patient still has significant metabolic acidosis.  I will continue insulin drip, continue IV fluids.  I will change IV fluids to D5 lactated Ringer with 20 mEq of potassium.  Also added sodium bicarbonate orally. I will start a diet. Planning to discontinue Insulin drip by evening time if CO2 level is better.  #2.  Hypokalemia. Continue supplement via IV.  3.  Sacrum decubitus wound with infection, sepsis. Status post I&D.  Pending culture results.  Leukocytosis improving. Appreciate ID consult, continue vancomycin and Zosyn.  4.  Non-STEMI. Secondary to demand ischemia.  Continue to follow.  5.  Acute kidney injury. Renal function improved.  6.  Essential hypertension.  Continue current regimen.  7.  Acute metabolic cephalopathy. Patient is still very sleepy today.  Continue to follow.  #8.  Reactive  thrombocytosis. Improved.    DVT prophylaxis: Lovenox Code Status: Full Family Communication: Niece updated.  .   Status is: Inpatient  Remains inpatient appropriate because:Inpatient level of care appropriate due to severity of illness   Dispo: The patient is from: Home              Anticipated d/c is to: Home              Anticipated d/c date is: 2 days              Patient currently is not medically stable to d/c.        I/O last 3 completed shifts: In: 5550.9 [I.V.:1523.5; IV Piggyback:4027.5] Out: 1825 [Urine:1825] No intake/output data recorded.     Consultants:   Cardiology, ID  Procedures: I&D  Antimicrobials:  Vancomycin and Zosyn.  Subjective: Patient is still sleepy today, but much more awake compared to yesterday.  She has some disorientation this morning. Denies any short of breath or cough. No abdominal pain or nausea vomiting. No fever or chills. No dysuria hematuria.  Objective: Vitals:   05/01/20 0400 05/01/20 0500 05/01/20 0600 05/01/20 0752  BP: 125/68 136/78  129/68  Pulse: 93 89 89 89  Resp: (!) 23 (!) 37 20 20  Temp:    98.3 F (36.8 C)  TempSrc:    Oral  SpO2: 99% 97% 97% 97%  Weight:      Height:        Intake/Output Summary (Last 24 hours) at 05/01/2020 0951 Last data filed at 05/01/2020 0329 Gross per 24  hour  Intake 3304.7 ml  Output 1825 ml  Net 1479.7 ml   Filed Weights   04/29/20 1418 04/30/20 0828  Weight: 82.6 kg 103 kg    Examination:  General exam: Appears calm and comfortable  Respiratory system: Clear to auscultation. Respiratory effort normal. Cardiovascular system: S1 & S2 heard, RRR. No JVD, murmurs, rubs, gallops or clicks. No pedal edema. Gastrointestinal system: Abdomen is nondistended, soft and nontender. No organomegaly or masses felt. Normal bowel sounds heard. Central nervous system: Drowsy and oriented x2. No focal neurological deficits. Extremities: Symmetric 5 x 5 power. Skin: decubitus  wound      Data Reviewed: I have personally reviewed following labs and imaging studies  CBC: Recent Labs  Lab 04/29/20 1411 04/30/20 2058 05/01/20 0728  WBC 35.6*  35.3* 16.3* 13.8*  NEUTROABS 31.1*  --  11.5*  HGB 16.0*  16.0* 12.7 11.6*  HCT 47.9*  47.2* 36.6 32.6*  MCV 93.7  92.0 90.4 88.1  PLT 608*  594* 318 284   Basic Metabolic Panel: Recent Labs  Lab 04/29/20 1725 04/29/20 2151 04/30/20 1032 04/30/20 1246 04/30/20 1625 04/30/20 2058 05/01/20 0043 05/01/20 0728  NA 134*   < > 139 138 141 142 142  --   K 3.4*   < > 3.0* 2.8* 3.0* 2.9* 3.2*  --   CL 106   < > 112* 112* 114* 115* 118*  --   CO2 <7*   < > 16* 14* 14* 16* 16*  --   GLUCOSE 527*   < > 275* 227* 156* 202* 175*  --   BUN 43*   < > 40* 39* 39* 38* 36*  --   CREATININE 1.37*   < > 1.05* 1.07* 1.02* 0.98 0.93  --   CALCIUM 8.1*   < > 8.6* 8.6* 8.7* 8.6* 8.4*  --   MG 2.2  --   --   --   --   --   --  1.8  PHOS 4.4  --   --   --   --   --   --   --    < > = values in this interval not displayed.   GFR: Estimated Creatinine Clearance: 68.8 mL/min (by C-G formula based on SCr of 0.93 mg/dL). Liver Function Tests: Recent Labs  Lab 04/30/20 1246  AST 41  ALT 27  ALKPHOS 96  BILITOT 0.6  PROT 6.2*  ALBUMIN 2.4*   Recent Labs  Lab 04/29/20 1725  LIPASE 90*   No results for input(s): AMMONIA in the last 168 hours. Coagulation Profile: Recent Labs  Lab 04/29/20 2155  INR 1.3*   Cardiac Enzymes: Recent Labs  Lab 04/29/20 1725  CKTOTAL 219   BNP (last 3 results) No results for input(s): PROBNP in the last 8760 hours. HbA1C: No results for input(s): HGBA1C in the last 72 hours. CBG: Recent Labs  Lab 05/01/20 0523 05/01/20 0630 05/01/20 0722 05/01/20 0844 05/01/20 0924  GLUCAP 127* 141* 168* 180* 170*   Lipid Profile: Recent Labs    04/30/20 0457  CHOL 266*  HDL 26*  LDLCALC 167*  TRIG 367*  CHOLHDL 10.2   Thyroid Function Tests: Recent Labs    04/29/20 2155    TSH 0.719   Anemia Panel: No results for input(s): VITAMINB12, FOLATE, FERRITIN, TIBC, IRON, RETICCTPCT in the last 72 hours. Sepsis Labs: Recent Labs  Lab 04/29/20 1725 04/29/20 1947 04/29/20 2155 04/30/20 0457  PROCALCITON 1.22  --   --   --  LATICACIDVEN  --  2.0* 2.5* 1.5    Recent Results (from the past 240 hour(s))  Respiratory Panel by RT PCR (Flu A&B, Covid) - Nasopharyngeal Swab     Status: None   Collection Time: 04/29/20  3:05 PM   Specimen: Nasopharyngeal Swab  Result Value Ref Range Status   SARS Coronavirus 2 by RT PCR NEGATIVE NEGATIVE Final    Comment: (NOTE) SARS-CoV-2 target nucleic acids are NOT DETECTED.  The SARS-CoV-2 RNA is generally detectable in upper respiratoy specimens during the acute phase of infection. The lowest concentration of SARS-CoV-2 viral copies this assay can detect is 131 copies/mL. A negative result does not preclude SARS-Cov-2 infection and should not be used as the sole basis for treatment or other patient management decisions. A negative result may occur with  improper specimen collection/handling, submission of specimen other than nasopharyngeal swab, presence of viral mutation(s) within the areas targeted by this assay, and inadequate number of viral copies (<131 copies/mL). A negative result must be combined with clinical observations, patient history, and epidemiological information. The expected result is Negative.  Fact Sheet for Patients:  https://www.moore.com/  Fact Sheet for Healthcare Providers:  https://www.young.biz/  This test is no t yet approved or cleared by the [place] FDA and  has been authorized for detection and/or diagnosis of SARS-CoV-2 by FDA under an Emergency Use Authorization (EUA). This EUA will remain  in effect (meaning this test can be used) for the duration of the COVID-19 declaration under Section 564(b)(1) of the Act, 21 U.S.C. section  360bbb-3(b)(1), unless the authorization is terminated or revoked sooner.     Influenza A by PCR NEGATIVE NEGATIVE Final   Influenza B by PCR NEGATIVE NEGATIVE Final    Comment: (NOTE) The Xpert Xpress SARS-CoV-2/FLU/RSV assay is intended as an aid in  the diagnosis of influenza from Nasopharyngeal swab specimens and  should not be used as a sole basis for treatment. Nasal washings and  aspirates are unacceptable for Xpert Xpress SARS-CoV-2/FLU/RSV  testing.  Fact Sheet for Patients: https://www.moore.com/  Fact Sheet for Healthcare Providers: https://www.young.biz/  This test is not yet approved or cleared by the [place] FDA and  has been authorized for detection and/or diagnosis of SARS-CoV-2 by  FDA under an Emergency Use Authorization (EUA). This EUA will remain  in effect (meaning this test can be used) for the duration of the  Covid-19 declaration under Section 564(b)(1) of the Act, 21  U.S.C. section 360bbb-3(b)(1), unless the authorization is  terminated or revoked. Performed at [facility], [address] [place], [place] [address]   Blood culture (routine x 2)     Status: None (Preliminary result)   Collection Time: 04/29/20  3:39 PM   Specimen: BLOOD  Result Value Ref Range Status   Specimen Description BLOOD BLOOD LEFT FOREARM  Final   Special Requests   Final    BOTTLES DRAWN AEROBIC AND ANAEROBIC Blood Culture adequate volume   Culture   Final    NO GROWTH 2 DAYS Performed at [facility], [address], [place], [place] [address]    Report Status PENDING  Incomplete  Blood culture (routine x 2)     Status: None (Preliminary result)   Collection Time: 04/29/20  3:44 PM   Specimen: BLOOD  Result Value Ref Range Status   Specimen Description BLOOD BLOOD RIGHT FOREARM  Final   Special Requests   Final    BOTTLES DRAWN AEROBIC AND ANAEROBIC Blood Culture adequate volume  Culture   Final     NO GROWTH 2 DAYS Performed at [facility], [address] [place], [place] [address]    Report Status PENDING  Incomplete  Urine culture     Status: Abnormal   Collection Time: 04/29/20  9:00 PM   Specimen: Urine, Catheterized  Result Value Ref Range Status   Specimen Description   Final    URINE, CATHETERIZED Performed at [facility], [address], [place], [place] [address]    Special Requests   Final    NONE Performed at [facility], [address] [place], [place] [address]    Culture MULTIPLE SPECIES PRESENT, SUGGEST RECOLLECTION (A)  Final   Report Status 05/01/2020 FINAL  Final  Aerobic/Anaerobic Culture (surgical/deep wound)     Status: None (Preliminary result)   Collection Time: 04/30/20  2:35 AM   Specimen: PATH Other; Wound  Result Value Ref Range Status   Specimen Description   Final    PERIRECTAL ABSCESS Performed at [facility], [address] [place], [place] [address]    Special Requests   Final    NONE Performed at [facility], [address] [place], [place] [address]    Gram Stain   Final    ABUNDANT WBC PRESENT, PREDOMINANTLY PMN ABUNDANT GRAM POSITIVE COCCI ABUNDANT GRAM NEGATIVE RODS ABUNDANT GRAM NEGATIVE COCCI Performed at [facility], [address] [place], [place] [address]    Culture ABUNDANT GRAM NEGATIVE RODS  Final   Report Status PENDING  Incomplete  Aerobic/Anaerobic Culture (surgical/deep wound)     Status: None (Preliminary result)   Collection Time: 04/30/20  2:35 AM   Specimen: PATH Other; Wound  Result Value Ref Range Status   Specimen Description   Final    PERIRECTAL ABSCESS Performed at [facility], [address] [place], [place] [address]    Special Requests   Final    NONE Performed at [facility], [address] [place], [place] [address]    Gram Stain   Final    MODERATE WBC PRESENT, PREDOMINANTLY PMN ABUNDANT GRAM NEGATIVE  RODS ABUNDANT GRAM POSITIVE COCCI ABUNDANT GRAM NEGATIVE COCCI Performed at [facility], [address] [place], [place] [address]    Culture ABUNDANT GRAM NEGATIVE RODS  Final   Report Status PENDING  Incomplete  Aerobic/Anaerobic Culture (surgical/deep wound)     Status: None (Preliminary result)   Collection Time: 04/30/20  2:35 AM   Specimen: PATH Other; Tissue  Result Value Ref Range Status   Specimen Description   Final    PERIRECTAL ABSCESS Performed at [facility], [address], [place], [place] [address]    Special Requests   Final    NONE Performed at [facility], [address] [place], [place] [address]    Gram Stain   Final    FEW WBC PRESENT, PREDOMINANTLY PMN ABUNDANT GRAM POSITIVE COCCI ABUNDANT GRAM NEGATIVE RODS Performed at [facility], [address] [place], [place] [address]    Culture FEW GRAM NEGATIVE RODS  Final   Report Status PENDING  Incomplete  MRSA PCR Screening     Status: None   Collection Time: 04/30/20  8:27 AM   Specimen: Nasal Mucosa; Nasopharyngeal  Result Value Ref Range Status   MRSA by PCR NEGATIVE NEGATIVE Final    Comment:        The GeneXpert MRSA Assay (FDA approved for NASAL specimens  only), is one component of a comprehensive MRSA colonization surveillance program. It is not intended to diagnose MRSA infection nor to guide or monitor treatment for MRSA infections. Performed at [facility], [address] [place], [place] [address]   Aerobic Culture (superficial specimen)     Status: None (Preliminary result)   Collection Time: 04/30/20  9:44 PM   Specimen: Leg  Result Value Ref Range Status   Specimen Description   Final    LEG Performed at [facility], [address] [place], [place] [address]    Special Requests   Final    NONE Performed at [facility], [address] [place], [place] [address]    Gram Stain   Final    NO WBC  SEEN MODERATE GRAM POSITIVE COCCI RARE YEAST Performed at [facility], [address] [place], [place] [address]    Culture PENDING  Incomplete   Report Status PENDING  Incomplete         Radiology Studies: CT PELVIS WO CONTRAST  Result Date: 04/29/2020 CLINICAL DATA:  Pelvic abscess EXAM: CT PELVIS WITHOUT CONTRAST TECHNIQUE: Multidetector CT imaging of the pelvis was performed following the standard protocol without intravenous contrast. COMPARISON:  04/29/2020 at 1847 hours FINDINGS: Urinary Tract: Residual contrast material fills the bladder. No bladder wall thickening or filling defect. Bowel: Visualized portions of the small bowel and colon are normal in appearance. The appendix is normal. Vascular/Lymphatic: Few scattered calcifications in the visualized iliac and external iliac arteries. No aneurysm. Reproductive: Uterus and ovaries are not enlarged. No free fluid in the pelvis. Other: Soft tissue gas and soft tissue infiltration demonstrated in the medial aspect of the right buttocks region extending to the gluteal crease, mostly inferior to the anus. Changes are consistent with cellulitis. The presence of soft tissue gas may indicate Fournier's gangrene. Small medial fluid collection in the superficial subcutaneous tissues measures 1.4 x 2.8 cm. This is consistent with a small abscess. Appearances are unchanged since the previous study. Musculoskeletal: Degenerative changes in the lower lumbar spine. No focal bone lesion or bone destruction. IMPRESSION: 1. Right medial gluteal soft tissue infiltration and soft tissue gas consistent with cellulitis, possibly representing Fournier's gangrene. 2. Small subcutaneous abscess. 3. No change since prior study. Electronically Signed   By: Burman Nieves M.D.   On: 04/29/2020 23:31   CT ABDOMEN PELVIS W CONTRAST  Addendum Date: 04/29/2020   ADDENDUM REPORT: 04/29/2020 21:39 ADDENDUM: After speaking with Dr. Katrinka Blazing in the ER, it is  noted that there is stranding and gas within the medial right buttock subcutaneous soft tissues near the gluteal cleft concerning for cellulitis/infection with possible developing abscess. These results were discussed by telephone at the time of interpretation on 04/29/2020 at 9:39 pm to provider [facility] , who verbally acknowledged these results. Electronically Signed   By: Charlett Nose M.D.   On: 04/29/2020 21:39   Result Date: 04/29/2020 CLINICAL DATA:  Acute nonlocalized abdominal pain. Recent passage of left renal stone. Diabetic ketoacidosis with leukocytosis and hypothermia. EXAM: CT ABDOMEN AND PELVIS WITH CONTRAST TECHNIQUE: Multidetector CT imaging of the abdomen and pelvis was performed using the standard protocol following bolus administration of intravenous contrast. CONTRAST:  75mL OMNIPAQUE IOHEXOL 300 MG/ML  SOLN COMPARISON:  None. FINDINGS: Lower chest: Lung bases are clear.  No pleural or pericardial fluid. Hepatobiliary: Liver parenchyma is normal. Previous cholecystectomy. Pancreas: Normal Spleen: Normal Adrenals/Urinary Tract: Adrenal glands are normal. Kidneys are normal. No cyst,  mass, stone or hydronephrosis. No stone along the course of either ureter. There is phlebolith or a small left ovarian calcification at the left pelvic brim adjacent to the ureter. No stone in the bladder. Stomach/Bowel: Stomach, small intestine and colon appear unremarkable. Vascular/Lymphatic: Aortic atherosclerosis. No aneurysm. IVC is normal. No retroperitoneal adenopathy. Reproductive: No pelvic mass. Uterus and adnexal regions appear normal. Other: No free fluid or air. Musculoskeletal: Lower lumbar degenerative changes with a degree of stenosis at L4-5. IMPRESSION: 1. No acute finding. No evidence of urinary tract stone disease or other urinary tract pathology. Phlebolith or small left ovarian calcification at the left pelvic brim is adjacent to the ureter and simulates a ureteral stone. 2. Previous  cholecystectomy. 3. 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 4. Aortic atherosclerosis. Aortic Atherosclerosis (ICD10-I70.0). Electronically Signed: By: Paulina Fusi M.D. On: 04/29/2020 19:10   DG Chest Portable 1 View  Result Date: 04/29/2020 CLINICAL DATA:  Tachypnea.  Shortness of breath.  Diabetes. EXAM: PORTABLE CHEST 1 VIEW COMPARISON:  None. FINDINGS: Artifact overlies the chest. Heart size is normal. Aortic atherosclerotic calcification. The pulmonary vascularity is normal. The lungs are clear. No effusions. No acute bone finding. IMPRESSION: No active disease. Aortic atherosclerotic calcification. Electronically Signed   By: Paulina Fusi M.D.   On: 04/29/2020 15:49   ECHOCARDIOGRAM COMPLETE  Result Date: 04/30/2020    ECHOCARDIOGRAM REPORT   Patient Name:   Michelle Stark Date of Exam: 04/30/2020 Medical Rec #:  [id number]          Height:       62.0 in Accession #:    [id number]         Weight:       227.1 lb Date of Birth:  November 02, 1955          BSA:          2.018 m Patient Age:    64 years           BP:           125/59 mmHg Patient Gender: F                  HR:           96 bpm. Exam Location:  [facility] Procedure: 2D Echo, Color Doppler, Cardiac Doppler and Intracardiac            Opacification Agent Indications:     Elevated troponin  History:         Patient has no prior history of Echocardiogram examinations.                  Risk Factors:Hypertension and Dyslipidemia.  Sonographer:     Humphrey Rolls RDCS (AE) Referring Phys:  [id number] Debbe Odea Diagnosing Phys: Debbe Odea MD  Sonographer Comments: Technically difficult study due to poor echo windows. Image acquisition challenging due to patient body habitus. IMPRESSIONS  1. Left ventricular ejection fraction, by estimation, is 60 to 65%. The left ventricle has normal function. The left ventricle has no regional wall motion abnormalities. Left ventricular diastolic parameters were normal.  2. Right  ventricular systolic function is normal. The right ventricular size is normal.  3. The mitral valve is normal in structure. No evidence of mitral valve regurgitation.  4. The aortic valve is grossly normal. Aortic valve regurgitation is not visualized.  5. The inferior vena cava is normal in size with greater than 50% respiratory variability, suggesting right atrial pressure  of 3 mmHg. FINDINGS  Left Ventricle: Left ventricular ejection fraction, by estimation, is 60 to 65%. The left ventricle has normal function. The left ventricle has no regional wall motion abnormalities. Definity contrast agent was given IV to delineate the left ventricular  endocardial borders. The left ventricular internal cavity size was normal in size. There is no left ventricular hypertrophy. Left ventricular diastolic parameters were normal. Right Ventricle: The right ventricular size is normal. No increase in right ventricular wall thickness. Right ventricular systolic function is normal. Left Atrium: Left atrial size was normal in size. Right Atrium: Right atrial size was normal in size. Pericardium: There is no evidence of pericardial effusion. Mitral Valve: The mitral valve is normal in structure. No evidence of mitral valve regurgitation. MV peak gradient, 6.9 mmHg. The mean mitral valve gradient is 2.0 mmHg. Tricuspid Valve: The tricuspid valve is grossly normal. Tricuspid valve regurgitation is not demonstrated. Aortic Valve: The aortic valve is grossly normal. Aortic valve regurgitation is not visualized. Aortic valve mean gradient measures 5.0 mmHg. Aortic valve peak gradient measures 8.2 mmHg. Aortic valve area, by VTI measures 2.96 cm. Pulmonic Valve: The pulmonic valve was normal in structure. Pulmonic valve regurgitation is not visualized. Aorta: The aortic root is normal in size and structure. Venous: The inferior vena cava is normal in size with greater than 50% respiratory variability, suggesting right atrial pressure of 3  mmHg. IAS/Shunts: No atrial level shunt detected by color flow Doppler.  LEFT VENTRICLE PLAX 2D LVOT diam:     2.10 cm     Diastology LV SV:         76          LV e' medial:    7.51 cm/s LV SV Index:   38          LV E/e' medial:  11.7 LVOT Area:     3.46 cm    LV e' lateral:   8.59 cm/s                            LV E/e' lateral: 10.2  LV Volumes (MOD) LV vol d, MOD A2C: 57.5 ml LV vol d, MOD A4C: 53.9 ml LV vol s, MOD A2C: 21.0 ml LV vol s, MOD A4C: 16.5 ml LV SV MOD A2C:     36.5 ml LV SV MOD A4C:     53.9 ml LV SV MOD BP:      38.1 ml LEFT ATRIUM           Index LA Vol (A4C): 33.8 ml 16.75 ml/m  AORTIC VALVE                    PULMONIC VALVE AV Area (Vmax):    2.86 cm     PV Vmax:       1.38 m/s AV Area (Vmean):   2.84 cm     PV Vmean:      85.700 cm/s AV Area (VTI):     2.96 cm     PV VTI:        0.229 m AV Vmax:           143.00 cm/s  PV Peak grad:  7.6 mmHg AV Vmean:          108.000 cm/s PV Mean grad:  3.0 mmHg AV VTI:            0.257 m AV Peak Grad:  8.2 mmHg AV Mean Grad:      5.0 mmHg LVOT Vmax:         118.00 cm/s LVOT Vmean:        88.500 cm/s LVOT VTI:          0.220 m LVOT/AV VTI ratio: 0.86  AORTA Ao Root diam: 2.70 cm MITRAL VALVE MV Area (PHT): 3.87 cm    SHUNTS MV Peak grad:  6.9 mmHg    Systemic VTI:  0.22 m MV Mean grad:  2.0 mmHg    Systemic Diam: 2.10 cm MV Vmax:       1.31 m/s MV Vmean:      66.6 cm/s MV Decel Time: 196 msec MV E velocity: 87.80 cm/s MV A velocity: 95.50 cm/s MV E/A ratio:  0.92 Debbe Odea MD Electronically signed by Debbe Odea MD Signature Date/Time: 04/30/2020/4:41:17 PM    Final         Scheduled Meds: . Chlorhexidine Gluconate Cloth  6 each Topical Daily  . enoxaparin (LOVENOX) injection  0.5 mg/kg Subcutaneous Q24H  . famotidine  20 mg Oral Daily  . sodium bicarbonate  1,300 mg Oral BID  . sodium hypochlorite   Irrigation Once   Continuous Infusions: . albumin human Stopped (04/30/20 0520)  . dextrose 5 % and 0.45 % NaCl with KCl  20 mEq/L Stopped (05/01/20 0251)  . insulin 4.4 Units/hr (05/01/20 0846)  . linezolid (ZYVOX) IV Stopped (04/30/20 2256)  . piperacillin-tazobactam (ZOSYN)  IV 3.375 g (05/01/20 0542)     LOS: 1 day    Time spent: 38 minutes    Marrion Coy, MD [facility]   To contact the attending provider between 7A-7P or the covering provider during after hours 7P-7A, please log into the web site [email or web address] and access using universal [place] password for that web site. If you do not have the password, please call the hospital operator.  05/01/2020, 9:51 AM

## 2020-05-01 NOTE — Progress Notes (Signed)
Initial Nutrition Assessment ° °DOCUMENTATION CODES:  ° °Morbid obesity ° °INTERVENTION:  °Provide Ensure Max Protein po BID, each supplement provides 150 kcal and 30 grams of protein. ° °Provide MVI po daily. ° °RD provided "Carbohydrate Counting for People with Diabetes" handout from the Academy of [employer]. Discussed different food groups and their effects on blood sugar, emphasizing carbohydrate-containing foods. Provided list of carbohydrates and recommended serving sizes of common foods. Discussed importance of controlled and consistent carbohydrate intake throughout the day. Provided examples of ways to balance meals/snacks and encouraged intake of high-fiber, whole grain complex carbohydrates. Teach back method used but patient is very sleepy today. ° °NUTRITION DIAGNOSIS:  ° °Increased nutrient needs related to wound healing as evidenced by estimated needs. 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  ° °Met with patient at bedside this morning. She was very sleepy but able to wake up and answer some questions. Patient reports she typically has a good appetite and intake. She reports eating 2-3 meals per day and that she usually will eat chicken, beef, or fish with vegetables and sides. She reports drinking water during the day. She was NPO at time of RD assessment but diet has now been advanced to carbohydrate modified. Patient is amenable to drinking oral nutrition supplements to help meet needs. Discussed increased nutrient needs for healing of wound. RD offered to return at later date/time  for diet education when patient was more awake but she reported she would like to continue with education. Provided diet education to patient but she was very sleepy. Encouraged patient to call with any questions once she is more awake. Patient would also benefit from outpatient diet education after discharge. ° °Patient reported she is weight-stable and she denies any weight loss. Patient currently documented to be 103 kg (227.07 lbs). Most recent weight in chart was from 2014 so unable to trend recent weight history. ° °Medications reviewed and include: famotidine, sodium bicarbonate 1300 mg BID, D5 in LR at 125 mL/hr, insulin gtt, linezolid, Zosyn. ° °Labs reviewed: CBG 158-180, Chloride 114, CO2 15, BUN 31. ° °Discussed with RN. ° °NUTRITION - FOCUSED PHYSICAL EXAM: ° °  Most Recent Value  °Orbital Region No depletion  °Upper Arm Region No depletion  °Thoracic and Lumbar Region No depletion  °Buccal Region No depletion  °Temple Region No depletion  °Clavicle Bone Region No depletion  °Clavicle and Acromion Bone Region No depletion  °Scapular Bone Region No depletion  °Dorsal Hand No depletion  °Patellar Region No depletion  °Anterior Thigh Region No depletion  °Posterior Calf Region No depletion  °Edema (RD Assessment) Mild  °Hair Reviewed  °Eyes Reviewed  °Mouth Reviewed  °Skin Reviewed  °Nails Reviewed  °  ° °Diet Order:   °Diet Order   °       °  Diet Carb Modified Fluid consistency: Thin; Room service appropriate? Yes  Diet effective now       °  °  °  °  ° ° °EDUCATION NEEDS:  ° °Education needs have been addressed ° °Skin:  Skin   Assessment: Skin Integrity Issues: Skin Integrity Issues:: Incisions Incisions: closed incision to perineum  Last BM:  Unknown  Height:   Ht Readings from Last 1 Encounters:  04/30/20 5' 2" (1.575 m)   Weight:   Wt Readings from Last 1 Encounters:  04/30/20 103 kg   BMI:  Body mass index is 41.53 kg/m.  Estimated Nutritional Needs:   Kcal:   2000-2200  Protein:  105-115 grams  Fluid:  2 L/day  Jacklynn Barnacle, MS, RD, LDN Pager number available on Amion

## 2020-05-01 NOTE — Progress Notes (Signed)
ID Michelle Stark is a 64 y.o. female presented to the ED with Nausea , vomiting and excess thirst for the past few days. Found to have blood sugar of 404 in the ED. No h/o diabetes before. She had left sided flank pain and left groin pain and passed a kidney stone In the ED vitals were temperature of 96, BP 140/110, respiratory rate 36, HR of 122. Labs revealed WBC of 35.3, Hb of 16, platelet of 594. Blood glucose was 408, creatinine 1.42, potassium 3.4, anion gap 19, CO2 11,.  High sensitive troponin was increased at 2104.  Marland Kitchen  Blood cultures were sent. She was diagnosed with DKA and started on IV fluids and insulin. She was noted to have extensive perianal gluteal erythema on the right side concerning for cellulitis.  She was started on vancomycin, Zosyn and clindamycin.  She was seen by surgeon who took her urgently for excisional debridement of the right perianal soft tissue for necrotizing infection. Cultures were sent. Post surgery she is in the ICU  Surgeon planning to take her back for surgery tomorrow   Vitals Patient Vitals for the past 24 hrs:  BP Temp Temp src Pulse Resp SpO2  05/01/20 1926 (!) 148/70 98.3 F (36.8 C) Axillary 86 -- 96 %  05/01/20 1900 -- -- -- 86 -- 97 %  05/01/20 1800 -- -- -- 91 -- 96 %  05/01/20 1700 -- -- -- 83 -- 95 %  05/01/20 1100 119/77 -- -- 94 -- 98 %  05/01/20 0752 129/68 98.3 F (36.8 C) Oral 89 20 97 %  05/01/20 0600 -- -- -- 89 20 97 %  05/01/20 0500 136/78 -- -- 89 (!) 37 97 %  05/01/20 0400 125/68 -- -- 93 (!) 23 99 %  05/01/20 0300 114/75 -- -- 92 14 98 %  05/01/20 0200 (!) 147/75 -- -- (!) 109 (!) 21 98 %  05/01/20 0158 (!) 110/58 97.9 F (36.6 C) Axillary 95 16 97 %  05/01/20 0100 (!) 110/58 -- -- 95 16 97 %  05/01/20 0000 116/65 -- -- 91 20 97 %  04/30/20 2300 -- -- -- -- 20 --  04/30/20 2200 104/82 -- -- (!) 101 16 99 %  04/30/20 2100 -- -- -- 93 20 99 %    More alert Says she is feeling better Chest b/la ir  entry [id number] abd soft Perineal dressing not removed   CBC Latest Ref Rng & Units 05/01/2020 04/30/2020 04/29/2020  WBC 4.0 - 10.5 K/uL 13.8(H) 16.3(H) 35.6(H)  Hemoglobin 12.0 - 15.0 g/dL 11.6(L) 12.7 16.0(H)  Hematocrit 36 - 46 % 32.6(L) 36.6 47.9(H)  Platelets 150 - 400 K/uL 284 318 608(H)   CMP Latest Ref Rng & Units 05/01/2020 05/01/2020 04/30/2020  Glucose 70 - 99 mg/dL 644(I) 347(Q) 259(D)  BUN 8 - 23 mg/dL 63(O) 75(I) 43(P)  Creatinine 0.44 - 1.00 mg/dL 2.95 1.88 4.16  Sodium 135 - 145 mmol/L 140 142 142  Potassium 3.5 - 5.1 mmol/L 3.5 3.2(L) 2.9(L)  Chloride 98 - 111 mmol/L 114(H) 118(H) 115(H)  CO2 22 - 32 mmol/L 15(L) 16(L) 16(L)  Calcium 8.9 - 10.3 mg/dL 8.3(L) 8.4(L) 8.6(L)  Total Protein 6.5 - 8.1 g/dL - - -  Total Bilirubin 0.3 - 1.2 mg/dL - - -  Alkaline Phos 38 - 126 U/L - - -  AST 15 - 41 U/L - - -  ALT 0 - 44 U/L - - -    Micro BC- NG WC-  GBS , gram neg rods  Impression/recommendation Newly diagnosed Diabetes found to be in DKA Being corrected  Necrotizing fascitis of the perianal/gluteal /perineal ( rt ) area- s/p excisional debridement Currently on zosyn and linezolid  Severe leucocytosis secondary to the infection and DKA- much improved  AKI due to dehydration -much improved Increase in troponin- seen by cardiology Left calf ulcerating wound-culture sent- r/o MRSA   Dr.Fitzgerald will follow her tomorrow

## 2020-05-01 NOTE — Progress Notes (Signed)
Results for KINNEDY, MONGIELLO (MRN [id number]) as of 05/01/2020 11:23  Ref. Range 04/30/2020 04:57  Hemoglobin A1C Latest Ref Range: 4.8 - 5.6 % 12.4 (H)  (309 mg/dl)     Met with pt this AM at bedside to discuss diagnosis of Diabetes/ DKA/ etc.  Pt was very sleepy but was able to answer a few questions.  Told me her Father and Grandfather both had diabetes.  Father of pt did use insulin to treat his CBGs.  Niece Jaclyn Shaggy arrived while I was in the pt's room.  Since pt was very sleepy, spent some time talking with pt's niece.  Niece told me she is a Therapist, sports with the [employer] [employer] [facility] [employer] and will be able to assist pt along with pt's 2 sons after d/c.  We discussed basic diabetes diagnosis, DKA diagnosis and treatment, and transition plan to SQ Insulin when appropriate.  Also discussed current A1c of 12.4% and the high probability pt will need insulin at time of d/c home.    Called pt's son Audelia Acton as well today.  Discussed all of the above info with Audelia Acton (see above) and plan to re-visit pt with her son Audelia Acton tomorrow around 11am when son arrives for visit.  Note TOC team consulted b/c pt does not have listed PCP and no Insurance listed either.  Son Audelia Acton told me he plans to call pt's place of employment to see if his mother (the pt) has insurance coverage.  If no insurance coverage, hopefully the [facility] will be able to help pt with meds at time of d/c home.     --Will follow patient during hospitalization--  Wyn Quaker RN, MSN, CDE Diabetes Coordinator Inpatient Glycemic Control Team Team Pager: [phone] (8a-5p)

## 2020-05-01 NOTE — Anesthesia Preprocedure Evaluation (Addendum)
Anesthesia Evaluation  Patient identified by MRN, date of birth, ID band Patient awake    Reviewed: Allergy & Precautions, NPO status , Patient's Chart, lab work & pertinent test results  History of Anesthesia Complications Negative for: history of anesthetic complications  Airway Mallampati: III       Dental   Pulmonary neg sleep apnea, neg COPD, Not current smoker, former smoker,           Cardiovascular hypertension, Pt. on medications + CAD (pt with elevated troponins, cards states NSTEMI)  (-) Past MI and (-) CHF (-) dysrhythmias (-) Valvular Problems/Murmurs  Pt with elevated troponins this admission, deemed by cardiology to be demand ischemia. See unremarkable echo below.  1. Left ventricular ejection fraction, by estimation, is 60 to 65%. The  left ventricle has normal function. The left ventricle has no regional  wall motion abnormalities. Left ventricular diastolic parameters were  normal.  2. Right ventricular systolic function is normal. The right ventricular  size is normal.  3. The mitral valve is normal in structure. No evidence of mitral valve  regurgitation.  4. The aortic valve is grossly normal. Aortic valve regurgitation is not  visualized.  5. The inferior vena cava is normal in size with greater than 50%  respiratory variability, suggesting right atrial pressure of 3 mmHg.    Neuro/Psych neg Seizures    GI/Hepatic Neg liver ROS, neg GERD  ,  Endo/Other  diabetes (insulin drip d/c'd this am), Poorly Controlled, Type 2, Insulin Dependent  Renal/GU Renal disease     Musculoskeletal   Abdominal   Peds  Hematology   Anesthesia Other Findings   Reproductive/Obstetrics                            Anesthesia Physical  Anesthesia Plan  ASA: II  Anesthesia Plan: General   Post-op Pain Management:    Induction: Intravenous  PONV Risk Score and Plan: 3 and  Ondansetron, Dexamethasone and Treatment may vary due to age or medical condition  Airway Management Planned: Oral ETT  Additional Equipment:   Intra-op Plan:   Post-operative Plan:   Informed Consent: I have reviewed the patients History and Physical, chart, labs and discussed the procedure including the risks, benefits and alternatives for the proposed anesthesia with the patient or authorized representative who has indicated his/her understanding and acceptance.       Plan Discussed with:   Anesthesia Plan Comments:         Anesthesia Quick Evaluation

## 2020-05-02 ENCOUNTER — Encounter
Admission: EM | Disposition: A | Discharge status: Home Health | Payer: Self-pay | Source: Home / Self Care | Attending: Internal Medicine

## 2020-05-02 ENCOUNTER — Inpatient Hospital Stay: Payer: No Typology Code available for payment source | Admitting: Anesthesiology

## 2020-05-02 DIAGNOSIS — M728 Other fibroblastic disorders: Secondary | ICD-10-CM | POA: Diagnosis not present

## 2020-05-02 DIAGNOSIS — B9620 Unspecified Escherichia coli [E. coli] as the cause of diseases classified elsewhere: Secondary | ICD-10-CM | POA: Diagnosis not present

## 2020-05-02 DIAGNOSIS — D75838 Other thrombocytosis: Secondary | ICD-10-CM

## 2020-05-02 DIAGNOSIS — L0231 Cutaneous abscess of buttock: Secondary | ICD-10-CM | POA: Diagnosis not present

## 2020-05-02 DIAGNOSIS — L03317 Cellulitis of buttock: Secondary | ICD-10-CM | POA: Diagnosis not present

## 2020-05-02 HISTORY — PX: INCISION AND DRAINAGE ABSCESS: SHX5864

## 2020-05-02 LAB — CBC WITH DIFFERENTIAL/PLATELET
Abs Immature Granulocytes: 0.14 10*3/uL — ABNORMAL HIGH (ref 0.00–0.07)
Basophils Absolute: 0 10*3/uL (ref 0.0–0.1)
Basophils Relative: 0 %
Eosinophils Absolute: 0 10*3/uL (ref 0.0–0.5)
Eosinophils Relative: 0 %
HCT: 30.9 % — ABNORMAL LOW (ref 36.0–46.0)
Hemoglobin: 11 g/dL — ABNORMAL LOW (ref 12.0–15.0)
Immature Granulocytes: 2 %
Lymphocytes Relative: 17 %
Lymphs Abs: 1.6 10*3/uL (ref 0.7–4.0)
MCH: 31.2 pg (ref 26.0–34.0)
MCHC: 35.6 g/dL (ref 30.0–36.0)
MCV: 87.5 fL (ref 80.0–100.0)
Monocytes Absolute: 0.7 10*3/uL (ref 0.1–1.0)
Monocytes Relative: 7 %
Neutro Abs: 6.9 10*3/uL (ref 1.7–7.7)
Neutrophils Relative %: 74 %
Platelets: 260 10*3/uL (ref 150–400)
RBC: 3.53 MIL/uL — ABNORMAL LOW (ref 3.87–5.11)
RDW: 13.9 % (ref 11.5–15.5)
WBC: 9.4 10*3/uL (ref 4.0–10.5)
nRBC: 0 % (ref 0.0–0.2)

## 2020-05-02 LAB — BASIC METABOLIC PANEL
Anion gap: 7 (ref 5–15)
BUN: 21 mg/dL (ref 8–23)
CO2: 20 mmol/L — ABNORMAL LOW (ref 22–32)
Calcium: 8.1 mg/dL — ABNORMAL LOW (ref 8.9–10.3)
Chloride: 116 mmol/L — ABNORMAL HIGH (ref 98–111)
Creatinine, Ser: 0.65 mg/dL (ref 0.44–1.00)
GFR, Estimated: >60 mL/min (ref >60)
Glucose, Bld: 180 mg/dL — ABNORMAL HIGH (ref 70–99)
Potassium: 3.6 mmol/L (ref 3.5–5.1)
Sodium: 143 mmol/L (ref 135–145)

## 2020-05-02 LAB — GLUCOSE, CAPILLARY
Glucose-Capillary: 143 mg/dL — ABNORMAL HIGH (ref 70–99)
Glucose-Capillary: 164 mg/dL — ABNORMAL HIGH (ref 70–99)
Glucose-Capillary: 167 mg/dL — ABNORMAL HIGH (ref 70–99)
Glucose-Capillary: 169 mg/dL — ABNORMAL HIGH (ref 70–99)
Glucose-Capillary: 178 mg/dL — ABNORMAL HIGH (ref 70–99)
Glucose-Capillary: 180 mg/dL — ABNORMAL HIGH (ref 70–99)
Glucose-Capillary: 228 mg/dL — ABNORMAL HIGH (ref 70–99)
Glucose-Capillary: 250 mg/dL — ABNORMAL HIGH (ref 70–99)
Glucose-Capillary: 279 mg/dL — ABNORMAL HIGH (ref 70–99)

## 2020-05-02 LAB — BETA-HYDROXYBUTYRIC ACID: Beta-Hydroxybutyric Acid: 0.11 mmol/L (ref 0.05–0.27)

## 2020-05-02 LAB — PHOSPHORUS: Phosphorus: <1 mg/dL — CL (ref 2.5–4.6)

## 2020-05-02 LAB — MAGNESIUM: Magnesium: 1.8 mg/dL (ref 1.7–2.4)

## 2020-05-02 SURGERY — INCISION AND DRAINAGE, ABSCESS
Anesthesia: General

## 2020-05-02 MED ORDER — MIDAZOLAM HCL 2 MG/2ML IJ SOLN
INTRAMUSCULAR | Status: AC
  Filled 2020-05-02: qty 2

## 2020-05-02 MED ORDER — ONDANSETRON HCL 4 MG/2ML IJ SOLN
INTRAMUSCULAR | Status: DC | PRN
  Administered 2020-05-02: 4 mg via INTRAVENOUS

## 2020-05-02 MED ORDER — FENTANYL CITRATE (PF) 250 MCG/5ML IJ SOLN
INTRAMUSCULAR | Status: AC
  Filled 2020-05-02: qty 5

## 2020-05-02 MED ORDER — LACTATED RINGERS IV SOLN: INTRAVENOUS | Status: DC | PRN

## 2020-05-02 MED ORDER — ACETAMINOPHEN 10 MG/ML IV SOLN
INTRAVENOUS | Status: DC | PRN
  Administered 2020-05-02: 1000 mg via INTRAVENOUS

## 2020-05-02 MED ORDER — KETAMINE HCL 10 MG/ML IJ SOLN
INTRAMUSCULAR | Status: DC | PRN
  Administered 2020-05-02: 100 mg via INTRAVENOUS

## 2020-05-02 MED ORDER — INSULIN ASPART 100 UNIT/ML ~~LOC~~ SOLN
0.0000 [IU] | Freq: Three times a day (TID) | SUBCUTANEOUS | Status: DC
  Administered 2020-05-02: 5 [IU] via SUBCUTANEOUS
  Administered 2020-05-02: 3 [IU] via SUBCUTANEOUS
  Filled 2020-05-02 (×2): qty 1

## 2020-05-02 MED ORDER — SEVOFLURANE IN SOLN
RESPIRATORY_TRACT | Status: AC
  Filled 2020-05-02: qty 250

## 2020-05-02 MED ORDER — PROPOFOL 10 MG/ML IV BOLUS
INTRAVENOUS | Status: DC | PRN
  Administered 2020-05-02: 100 mg via INTRAVENOUS

## 2020-05-02 MED ORDER — SODIUM PHOSPHATES 45 MMOLE/15ML IV SOLN
30.0000 mmol | Freq: Once | INTRAVENOUS | Status: DC
  Filled 2020-05-02: qty 10

## 2020-05-02 MED ORDER — ONDANSETRON HCL 4 MG/2ML IJ SOLN: 4.0000 mg | Freq: Once | INTRAMUSCULAR | Status: DC | PRN

## 2020-05-02 MED ORDER — ONDANSETRON HCL 4 MG/2ML IJ SOLN
INTRAMUSCULAR | Status: AC
  Filled 2020-05-02: qty 2

## 2020-05-02 MED ORDER — KETAMINE HCL 50 MG/ML IJ SOLN
INTRAMUSCULAR | Status: AC
  Filled 2020-05-02: qty 10

## 2020-05-02 MED ORDER — FENTANYL CITRATE (PF) 100 MCG/2ML IJ SOLN: 25.0000 ug | INTRAMUSCULAR | Status: DC | PRN

## 2020-05-02 MED ORDER — DEXAMETHASONE SODIUM PHOSPHATE 10 MG/ML IJ SOLN
INTRAMUSCULAR | Status: DC | PRN
  Administered 2020-05-02: 10 mg via INTRAVENOUS

## 2020-05-02 MED ORDER — FENTANYL CITRATE (PF) 100 MCG/2ML IJ SOLN
INTRAMUSCULAR | Status: DC | PRN
  Administered 2020-05-02: 100 ug via INTRAVENOUS
  Administered 2020-05-02: 150 ug via INTRAVENOUS

## 2020-05-02 MED ORDER — MIDAZOLAM HCL 2 MG/2ML IJ SOLN
INTRAMUSCULAR | Status: DC | PRN
  Administered 2020-05-02: 2 mg via INTRAVENOUS

## 2020-05-02 MED ORDER — POTASSIUM PHOSPHATES 15 MMOLE/5ML IV SOLN
10.0000 mmol | Freq: Once | INTRAVENOUS | Status: DC
  Filled 2020-05-02: qty 3.33

## 2020-05-02 MED ORDER — DEXAMETHASONE SODIUM PHOSPHATE 10 MG/ML IJ SOLN
INTRAMUSCULAR | Status: AC
  Filled 2020-05-02: qty 1

## 2020-05-02 MED ORDER — SUCCINYLCHOLINE CHLORIDE 20 MG/ML IJ SOLN
INTRAMUSCULAR | Status: DC | PRN
  Administered 2020-05-02: 100 mg via INTRAVENOUS

## 2020-05-02 MED ORDER — PROPOFOL 10 MG/ML IV BOLUS
INTRAVENOUS | Status: AC
  Filled 2020-05-02: qty 20

## 2020-05-02 MED ORDER — INSULIN GLARGINE 100 UNIT/ML ~~LOC~~ SOLN
20.0000 [IU] | Freq: Every day | SUBCUTANEOUS | Status: DC
  Administered 2020-05-02: 20 [IU] via SUBCUTANEOUS
  Filled 2020-05-02 (×2): qty 0.2

## 2020-05-02 MED ORDER — LIDOCAINE HCL (CARDIAC) PF 100 MG/5ML IV SOSY
PREFILLED_SYRINGE | INTRAVENOUS | Status: DC | PRN
  Administered 2020-05-02: 50 mg via INTRAVENOUS

## 2020-05-02 MED ORDER — LIDOCAINE HCL (PF) 2 % IJ SOLN
INTRAMUSCULAR | Status: AC
  Filled 2020-05-02: qty 5

## 2020-05-02 MED ORDER — ACETAMINOPHEN 10 MG/ML IV SOLN
INTRAVENOUS | Status: AC
  Filled 2020-05-02: qty 100

## 2020-05-02 MED ORDER — INSULIN ASPART 100 UNIT/ML ~~LOC~~ SOLN
0.0000 [IU] | Freq: Three times a day (TID) | SUBCUTANEOUS | Status: DC
  Administered 2020-05-03: 3 [IU] via SUBCUTANEOUS
  Administered 2020-05-03: 2 [IU] via SUBCUTANEOUS
  Administered 2020-05-03: 3 [IU] via SUBCUTANEOUS
  Administered 2020-05-03: 7 [IU] via SUBCUTANEOUS
  Administered 2020-05-04 (×2): 2 [IU] via SUBCUTANEOUS
  Administered 2020-05-04: 1 [IU] via SUBCUTANEOUS
  Administered 2020-05-04: 2 [IU] via SUBCUTANEOUS
  Administered 2020-05-05: 1 [IU] via SUBCUTANEOUS
  Administered 2020-05-05: 3 [IU] via SUBCUTANEOUS
  Administered 2020-05-05: 5 [IU] via SUBCUTANEOUS
  Administered 2020-05-06: 3 [IU] via SUBCUTANEOUS
  Administered 2020-05-06: 2 [IU] via SUBCUTANEOUS
  Administered 2020-05-06 (×2): 3 [IU] via SUBCUTANEOUS
  Administered 2020-05-07 – 2020-05-08 (×4): 2 [IU] via SUBCUTANEOUS
  Administered 2020-05-08: 1 [IU] via SUBCUTANEOUS
  Administered 2020-05-08: 2 [IU] via SUBCUTANEOUS
  Administered 2020-05-08: 3 [IU] via SUBCUTANEOUS
  Administered 2020-05-09: 2 [IU] via SUBCUTANEOUS
  Filled 2020-05-02 (×23): qty 1

## 2020-05-02 SURGICAL SUPPLY — 31 items
ADH LQ OCL WTPRF AMP STRL LF (MISCELLANEOUS) ×5
ADHESIVE MASTISOL STRL (MISCELLANEOUS) ×10 IMPLANT
BLADE CLIPPER SURG (BLADE) ×1 IMPLANT
BLADE SURG 15 STRL LF DISP TIS (BLADE) ×1 IMPLANT
BLADE SURG 15 STRL SS (BLADE) ×3
BRUSH SCRUB EZ  4% CHG (MISCELLANEOUS)
BRUSH SCRUB EZ 4% CHG (MISCELLANEOUS) ×1 IMPLANT
CANISTER SUCT 3000ML PPV (MISCELLANEOUS) ×3 IMPLANT
COVER WAND RF STERILE (DRAPES) ×3 IMPLANT
DRAPE INCISE IOBAN 66X45 STRL (DRAPES) ×2 IMPLANT
DRAPE LAPAROTOMY 77X122 PED (DRAPES) ×5 IMPLANT
DRAPE LEGGINS SURG 28X43 STRL (DRAPES) ×2 IMPLANT
DRAPE UNDER BUTTOCK W/FLU (DRAPES) ×2 IMPLANT
DRSG HYDROCOLLOID 4X4 (GAUZE/BANDAGES/DRESSINGS) ×2 IMPLANT
DRSG VAC ATS MED SENSATRAC (GAUZE/BANDAGES/DRESSINGS) IMPLANT
ELECT REM PT RETURN 9FT ADLT (ELECTROSURGICAL)
ELECTRODE REM PT RTRN 9FT ADLT (ELECTROSURGICAL) ×1 IMPLANT
GLOVE BIO SURGEON STRL SZ7 (GLOVE) ×3 IMPLANT
GOWN STRL REUS W/ TWL LRG LVL3 (GOWN DISPOSABLE) ×2 IMPLANT
GOWN STRL REUS W/TWL LRG LVL3 (GOWN DISPOSABLE) ×6
KIT DRSG VAC SLVR GRANUFM (MISCELLANEOUS) ×2 IMPLANT
NEEDLE HYPO 22GX1.5 SAFETY (NEEDLE) ×3 IMPLANT
NS IRRIG 1000ML POUR BTL (IV SOLUTION) ×3 IMPLANT
PACK BASIN MINOR (MISCELLANEOUS) ×1 IMPLANT
PULSAVAC PLUS IRRIG FAN TIP (DISPOSABLE) ×3
SOL PREP PVP 2OZ (MISCELLANEOUS) ×3
SOLUTION PREP PVP 2OZ (MISCELLANEOUS) ×1 IMPLANT
SPONGE LAP 18X18 RF (DISPOSABLE) ×3 IMPLANT
SYR BULB IRRIG 60ML STRL (SYRINGE) ×2 IMPLANT
TIP FAN IRRIG PULSAVAC PLUS (DISPOSABLE) IMPLANT
duaderm ×2 IMPLANT

## 2020-05-02 NOTE — Anesthesia Postprocedure Evaluation (Signed)
Anesthesia Post Note  Patient: Michelle Stark  Procedure(s) Performed: INCISION AND DRAINAGE ABSCESS (N/A )  Patient location during evaluation: PACU Anesthesia Type: General Level of consciousness: awake and alert Pain management: pain level controlled Vital Signs Assessment: post-procedure vital signs reviewed and stable Respiratory status: spontaneous breathing and respiratory function stable Cardiovascular status: stable Anesthetic complications: no   No complications documented.   Last Vitals:  Vitals:   05/02/20 1044 05/02/20 1059  BP: (!) 101/46 (!) 105/52  Pulse: 88 88  Resp: (!) 21 (!) 22  Temp: 37.3 C   SpO2: 99% 100%    Last Pain:  Vitals:   05/02/20 1059  TempSrc:   PainSc: 0-No pain                 Savera Donson K

## 2020-05-02 NOTE — Transfer of Care (Signed)
Immediate Anesthesia Transfer of Care Note  Patient: Michelle Stark  Procedure(s) Performed: INCISION AND DRAINAGE ABSCESS (N/A )  Patient Location: PACU  Anesthesia Type:General  Level of Consciousness: drowsy  Airway & Oxygen Therapy: Patient Spontanous Breathing and Patient connected to face mask oxygen  Post-op Assessment: Report given to RN and Post -op Vital signs reviewed and stable  Post vital signs: Reviewed and stable  Last Vitals:  Vitals Value Taken Time  BP 101/46 05/02/20 1044  Temp    Pulse 86 05/02/20 1045  Resp 21 05/02/20 1045  SpO2 100 % 05/02/20 1045  Vitals shown include unvalidated device data.  Last Pain:  Vitals:   05/02/20 0730  TempSrc:   PainSc: 0-No pain      Patients Stated Pain Goal: 0 (04/29/20 1742)  Complications: No complications documented.

## 2020-05-02 NOTE — Progress Notes (Signed)
Inpatient Diabetes Program Recommendations  AACE/ADA: New Consensus Statement on Inpatient Glycemic Control (2015)  Target Ranges:  Prepandial:   less than 140 mg/dL      Peak postprandial:   less than 180 mg/dL (1-2 hours)      Critically ill patients:  140 - 180 mg/dL   Results for IMYA, MANCE (MRN [id number]) as of 05/02/2020 08:00  Ref. Range 05/02/2020 06:26  Sodium Latest Ref Range: 135 - 145 mmol/L 143  Potassium Latest Ref Range: 3.5 - 5.1 mmol/L 3.6  Chloride Latest Ref Range: 98 - 111 mmol/L 116 (H)  CO2 Latest Ref Range: 22 - 32 mmol/L 20 (L)  Glucose Latest Ref Range: 70 - 99 mg/dL 785 (H)  BUN Latest Ref Range: 8 - 23 mg/dL 21  Creatinine Latest Ref Range: 0.44 - 1.00 mg/dL 8.85  Calcium Latest Ref Range: 8.9 - 10.3 mg/dL 8.1 (L)  Anion gap Latest Ref Range: 5 - 15  7   Admit with: DKA/ New Diagnosis of Diabetes/ Necrotizing fascitis of the perianal/gluteal/perineal   Current Orders: IV Insulin Drip    MD- Note Anion Gap down to 7 and CO2 finally up to 20 this AM on BMET at 6:30am  When you allow pt to transition to SQ Insulin, please consider:  1. Start Levemir 35 units Daily (0.35 units/kg)  Make sure to Continue the IV Insulin Drip for at least 1 hour after the Levemir has been given  2. Start Novolog Moderate Correction Scale/ SSI (0-15 units) TID AC + HS  3. Start Novolog 4 units TID with meals for Meal Coverage (if pt allowed to start PO diet)     --Will follow patient during hospitalization--  Ambrose Finland RN, MSN, CDE Diabetes Coordinator Inpatient Glycemic Control Team Team Pager: [phone] (8a-5p)

## 2020-05-02 NOTE — Progress Notes (Addendum)
PROGRESS NOTE    Michelle Stark  ZOX:[id number] DOB: [birth year] DOA: 04/29/2020 PCP: Patient, No Pcp Per   Chief complaint.  Follow-up for DKA. Brief Narrative:   Michelle Stark a 64 y.o.femalewith medical history significant of HTN, obesity. HLD.Presented withN/V/ increased thirst and fatigue. Patient was unaware she is diabetic Her BG per EMS was 404.Upon arriving the hospital, her CO2 level was less than 7, she had severe metabolic acidosis. Insulin drip was started. Patient was also seen by general surgery for decubitus sacral wound, had surgical I&D performed on 10/20. Currently covered with antibiotics with Zosyn and vancomycin.  10/21.  Patient is seen by ID, antibiotics switched to Zyvox and Zosyn. 10/22.  Patient going back to the OR for ID.  Patient anion gap has closed for 24 hours, CO2 level now 20, patient has mild non-anion gap metabolic acidosis.  Continued sodium bicarb tablet, discontinue insulin drip, start Lantus.  Assessment & Plan:   Active Problems:   Hypertension   HLD (hyperlipidemia)   Obesity   DKA (diabetic ketoacidosis) (HCC)   AKI (acute kidney injury) (HCC)   Dehydration   Metabolic acidosis due to diabetes mellitus (HCC)   Sepsis (HCC)   Abscess and cellulitis of gluteal region   Elevated troponin  #1.  Uncontrolled type 2 diabetes with diabetic ketoacidosis. Anion gap has closed more than 24 hours ago.  Patient was started on bicarb orally for now anion gap metabolic acidosis. So far, patient ketoacidosis has resolved.  I will discontinue IV fluid, discontinue IV insulin, start Lantus and sliding scale insulin.  2.  Hypokalemia. Improved.  3.  Sacrum decubitus wound with infection, sepsis. Patient is going back to the OR for additional debridement today. Continue Zyvox and Zosyn.  Wound culture still pending.  Leukocytosis resolved.  4.  Non-STEMI. Secondary to demand ischemia.  No additional work-up at this time.  5.   Acute kidney injury Renal function improved  6.  Essential hypertension. Continue current regimen.  7.  Acute metabolic encephalopathy. Improved.  8. Hypophosphatemia. Phos level less than 1. Will supplement IV.      DVT prophylaxis: Lovenox Code Status: Full Family Communication:  Disposition Plan:     Status is: Inpatient  Remains inpatient appropriate because:Inpatient level of care appropriate due to severity of illness   Dispo: The patient is from: Home              Anticipated d/c is to: Home              Anticipated d/c date is: 3 days              Patient currently is not medically stable to d/c.        I/O last 3 completed shifts: In: 4610.8 [P.O.:240; I.V.:2899.8; IV Piggyback:1471] Out: 1425 [Urine:1425] No intake/output data recorded.     Consultants:   Surgery, ID  Procedures: Sacral wound I&D.  Antimicrobials:  Zosyn and Zyvox.  Subjective: Patient just woke up from sleep, she does not have have any confusion. Denies any fever chills. No abdominal pain or nausea vomiting. No short of breath or cough. No headache or dizziness.  Objective: Vitals:   05/02/20 0400 05/02/20 0500 05/02/20 0600 05/02/20 0700  BP: 129/76 (!) 148/89 (!) 122/51 118/66  Pulse: 74 77 75 73  Resp: Temp:      TempSrc:      SpO2: 95% 97% 96% 95%  Weight:  Height:        Intake/Output Summary (Last 24 hours) at 05/02/2020 0824 Last data filed at 05/02/2020 0321 Gross per 24 hour  Intake 2989.29 ml  Output 1100 ml  Net 1889.29 ml   Filed Weights   04/29/20 1418 04/30/20 0828  Weight: 82.6 kg 103 kg    Examination:  General exam: Appears calm and comfortable  Respiratory system: Clear to auscultation. Respiratory effort normal. Cardiovascular system: S1 & S2 heard, RRR. No JVD, murmurs, rubs, gallops or clicks. No pedal edema. Gastrointestinal system: Abdomen is nondistended, soft and nontender. No organomegaly or masses felt.  Normal bowel sounds heard. Central nervous system: Alert and oriented. No focal neurological deficits. Extremities: Symmetric  Skin: No rashes, lesions or ulcers Psychiatry: Mood & affect appropriate.     Data Reviewed: I have personally reviewed following labs and imaging studies  CBC: Recent Labs  Lab 04/29/20 1411 04/30/20 2058 05/01/20 0728 05/02/20 0626  WBC 35.6*   35.3* 16.3* 13.8* 9.4  NEUTROABS 31.1*  --  11.5* 6.9  HGB 16.0*   16.0* 12.7 11.6* 11.0*  HCT 47.9*   47.2* 36.6 32.6* 30.9*  MCV 93.7   92.0 90.4 88.1 87.5  PLT 608*   594* 318 284 260   Basic Metabolic Panel: Recent Labs  Lab 04/29/20 1725 04/29/20 2151 04/30/20 2058 05/01/20 0043 05/01/20 0728 05/01/20 1259 05/01/20 2133 05/02/20 0626  NA 134*   < > 142 142  --  140 142 143  K 3.4*   < > 2.9* 3.2*  --  3.5 3.4* 3.6  CL 106   < > 115* 118*  --  114* 116* 116*  CO2 <7*   < > 16* 16*  --  15* 17* 20*  GLUCOSE 527*   < > 202* 175*  --  186* 199* 180*  BUN 43*   < > 38* 36*  --  31* 25* 21  CREATININE 1.37*   < > 0.98 0.93  --  0.87 0.80 0.65  CALCIUM 8.1*   < > 8.6* 8.4*  --  8.3* 8.3* 8.1*  MG 2.2  --   --   --  1.8  --   --  1.8  PHOS 4.4  --   --   --   --   --   --  <1.0*   < > = values in this interval not displayed.   GFR: Estimated Creatinine Clearance: 80 mL/min (by C-G formula based on SCr of 0.65 mg/dL). Liver Function Tests: Recent Labs  Lab 04/30/20 1246  AST 41  ALT 27  ALKPHOS 96  BILITOT 0.6  PROT 6.2*  ALBUMIN 2.4*   Recent Labs  Lab 04/29/20 1725  LIPASE 90*   No results for input(s): AMMONIA in the last 168 hours. Coagulation Profile: Recent Labs  Lab 04/29/20 2155  INR 1.3*   Cardiac Enzymes: Recent Labs  Lab 04/29/20 1725  CKTOTAL 219   BNP (last 3 results) No results for input(s): PROBNP in the last 8760 hours. HbA1C: Recent Labs    04/30/20 0457  HGBA1C 12.4*   CBG: Recent Labs  Lab 05/01/20 2255 05/02/20 0025 05/02/20 0230 05/02/20 0459  05/02/20 0706  GLUCAP 168* 164* 167* 143* 169*   Lipid Profile: Recent Labs    04/30/20 0457  CHOL 266*  HDL 26*  LDLCALC 167*  TRIG 367*  CHOLHDL 10.2   Thyroid Function Tests: Recent Labs    04/29/20 2155  TSH 0.719   Anemia  Panel: No results for input(s): VITAMINB12, FOLATE, FERRITIN, TIBC, IRON, RETICCTPCT in the last 72 hours. Sepsis Labs: Recent Labs  Lab 04/29/20 1725 04/29/20 1947 04/29/20 2155 04/30/20 0457  PROCALCITON 1.22  --   --   --   LATICACIDVEN  --  2.0* 2.5* 1.5    Recent Results (from the past 240 hour(s))  Respiratory Panel by RT PCR (Flu A&B, Covid) - Nasopharyngeal Swab     Status: None   Collection Time: 04/29/20  3:05 PM   Specimen: Nasopharyngeal Swab  Result Value Ref Range Status   SARS Coronavirus 2 by RT PCR NEGATIVE NEGATIVE Final    Comment: (NOTE) SARS-CoV-2 target nucleic acids are NOT DETECTED.  The SARS-CoV-2 RNA is generally detectable in upper respiratoy specimens during the acute phase of infection. The lowest concentration of SARS-CoV-2 viral copies this assay can detect is 131 copies/mL. A negative result does not preclude SARS-Cov-2 infection and should not be used as the sole basis for treatment or other patient management decisions. A negative result may occur with  improper specimen collection/handling, submission of specimen other than nasopharyngeal swab, presence of viral mutation(s) within the areas targeted by this assay, and inadequate number of viral copies (<131 copies/mL). A negative result must be combined with clinical observations, patient history, and epidemiological information. The expected result is Negative.  Fact Sheet for Patients:  https://www.moore.com/  Fact Sheet for Healthcare Providers:  https://www.young.biz/  This test is no t yet approved or cleared by the [place] FDA and  has been authorized for detection and/or diagnosis of SARS-CoV-2  by FDA under an Emergency Use Authorization (EUA). This EUA will remain  in effect (meaning this test can be used) for the duration of the COVID-19 declaration under Section 564(b)(1) of the Act, 21 U.S.C. section 360bbb-3(b)(1), unless the authorization is terminated or revoked sooner.     Influenza A by PCR NEGATIVE NEGATIVE Final   Influenza B by PCR NEGATIVE NEGATIVE Final    Comment: (NOTE) The Xpert Xpress SARS-CoV-2/FLU/RSV assay is intended as an aid in  the diagnosis of influenza from Nasopharyngeal swab specimens and  should not be used as a sole basis for treatment. Nasal washings and  aspirates are unacceptable for Xpert Xpress SARS-CoV-2/FLU/RSV  testing.  Fact Sheet for Patients: https://www.moore.com/  Fact Sheet for Healthcare Providers: https://www.young.biz/  This test is not yet approved or cleared by the [place] FDA and  has been authorized for detection and/or diagnosis of SARS-CoV-2 by  FDA under an Emergency Use Authorization (EUA). This EUA will remain  in effect (meaning this test can be used) for the duration of the  Covid-19 declaration under Section 564(b)(1) of the Act, 21  U.S.C. section 360bbb-3(b)(1), unless the authorization is  terminated or revoked. Performed at [facility], [address] [place], [place] [address]   Blood culture (routine x 2)     Status: None (Preliminary result)   Collection Time: 04/29/20  3:39 PM   Specimen: BLOOD  Result Value Ref Range Status   Specimen Description BLOOD BLOOD LEFT FOREARM  Final   Special Requests   Final    BOTTLES DRAWN AEROBIC AND ANAEROBIC Blood Culture adequate volume   Culture   Final    NO GROWTH 3 DAYS Performed at [facility], [address] [place], [place] [address]    Report Status PENDING  Incomplete  Blood culture (routine x 2)     Status: None (Preliminary result)   Collection Time:  04/29/20  3:44 PM   Specimen:  BLOOD  Result Value Ref Range Status   Specimen Description BLOOD BLOOD RIGHT FOREARM  Final   Special Requests   Final    BOTTLES DRAWN AEROBIC AND ANAEROBIC Blood Culture adequate volume   Culture   Final    NO GROWTH 3 DAYS Performed at [facility], [address] [place], [place] [address]    Report Status PENDING  Incomplete  Urine culture     Status: Abnormal   Collection Time: 04/29/20  9:00 PM   Specimen: Urine, Catheterized  Result Value Ref Range Status   Specimen Description   Final    URINE, CATHETERIZED Performed at [facility], [address], [place], [place] [address]    Special Requests   Final    NONE Performed at [facility], [address] [place], [place] [address]    Culture MULTIPLE SPECIES PRESENT, SUGGEST RECOLLECTION (A)  Final   Report Status 05/01/2020 FINAL  Final  Aerobic/Anaerobic Culture (surgical/deep wound)     Status: None (Preliminary result)   Collection Time: 04/30/20  2:35 AM   Specimen: PATH Other; Wound  Result Value Ref Range Status   Specimen Description   Final    PERIRECTAL ABSCESS Performed at [facility], [address] [place], [place] [address]    Special Requests   Final    NONE Performed at [facility], [address] [place], [place] [address]    Gram Stain   Final    ABUNDANT WBC PRESENT, PREDOMINANTLY PMN ABUNDANT GRAM POSITIVE COCCI ABUNDANT GRAM NEGATIVE RODS ABUNDANT GRAM NEGATIVE COCCI    Culture   Final    ABUNDANT GRAM NEGATIVE RODS ABUNDANT GROUP B STREP(S.AGALACTIAE)ISOLATED TESTING AGAINST S. AGALACTIAE NOT ROUTINELY PERFORMED DUE TO PREDICTABILITY OF AMP/PEN/VAN SUSCEPTIBILITY. Performed at [facility], [address] [place], [place] [address]    Report Status PENDING  Incomplete  Aerobic/Anaerobic Culture (surgical/deep wound)     Status: None (Preliminary result)   Collection Time: 04/30/20  2:35 AM   Specimen: PATH Other; Wound    Result Value Ref Range Status   Specimen Description   Final    PERIRECTAL ABSCESS Performed at [facility], [address] [place], [place] [address]    Special Requests   Final    NONE Performed at [facility], [address] [place], [place] [address]    Gram Stain   Final    MODERATE WBC PRESENT, PREDOMINANTLY PMN ABUNDANT GRAM NEGATIVE RODS ABUNDANT GRAM POSITIVE COCCI ABUNDANT GRAM NEGATIVE COCCI    Culture   Final    ABUNDANT GRAM NEGATIVE RODS ABUNDANT GROUP B STREP(S.AGALACTIAE)ISOLATED TESTING AGAINST S. AGALACTIAE NOT ROUTINELY PERFORMED DUE TO PREDICTABILITY OF AMP/PEN/VAN SUSCEPTIBILITY. Performed at [facility], [address], [place], [place] [address]    Report Status PENDING  Incomplete  Aerobic/Anaerobic Culture (surgical/deep wound)     Status: None (Preliminary result)   Collection Time: 04/30/20  2:35 AM   Specimen: PATH Other; Tissue  Result Value Ref Range Status   Specimen Description   Final    PERIRECTAL ABSCESS Performed at [facility], [address], [place], [place] [address]    Special Requests   Final    NONE Performed at [facility], [address] [place], [place] [address]    Gram Stain   Final    FEW WBC PRESENT, PREDOMINANTLY PMN ABUNDANT GRAM POSITIVE COCCI ABUNDANT GRAM  NEGATIVE RODS    Culture   Final    FEW GRAM NEGATIVE RODS ABUNDANT GROUP B STREP(S.AGALACTIAE)ISOLATED TESTING AGAINST S. AGALACTIAE NOT ROUTINELY PERFORMED DUE TO PREDICTABILITY OF AMP/PEN/VAN SUSCEPTIBILITY. Performed at [facility], [address] [place], [place] [address]    Report Status PENDING  Incomplete  MRSA PCR Screening     Status: None   Collection Time: 04/30/20  8:27 AM   Specimen: Nasal Mucosa; Nasopharyngeal  Result Value Ref Range Status   MRSA by PCR NEGATIVE NEGATIVE Final    Comment:        The GeneXpert MRSA Assay (FDA approved for NASAL specimens only), is one component  of a comprehensive MRSA colonization surveillance program. It is not intended to diagnose MRSA infection nor to guide or monitor treatment for MRSA infections. Performed at [facility], [address] [place], [place] [address]   Aerobic Culture (superficial specimen)     Status: None (Preliminary result)   Collection Time: 04/30/20  9:44 PM   Specimen: Leg  Result Value Ref Range Status   Specimen Description   Final    LEG Performed at [facility], [address], [place], [place] [address]    Special Requests   Final    NONE Performed at [facility], [address] [place], [place] [address]    Gram Stain   Final    NO WBC SEEN MODERATE GRAM POSITIVE COCCI RARE YEAST Performed at [facility], [address] [place], [place] [address]    Culture PENDING  Incomplete   Report Status PENDING  Incomplete         Radiology Studies: ECHOCARDIOGRAM COMPLETE  Result Date: 04/30/2020    ECHOCARDIOGRAM REPORT   Patient Name:   LASHONDA SONNEBORN Date of Exam: 04/30/2020 Medical Rec #:  [id number]          Height:       62.0 in Accession #:    [id number]         Weight:       227.1 lb Date of Birth:  [birth year]          BSA:          2.018 m Patient Age:    64 years           BP:           125/59 mmHg Patient Gender: F                  HR:           96 bpm. Exam Location:  [facility] Procedure: 2D Echo, Color Doppler, Cardiac Doppler and Intracardiac            Opacification Agent Indications:     Elevated troponin  History:         Patient has no prior history of Echocardiogram examinations.                  Risk Factors:Hypertension and Dyslipidemia.  Sonographer:     Humphrey Rolls RDCS (AE) Referring Phys:  [id number] Debbe Odea Diagnosing Phys: Debbe Odea MD  Sonographer Comments: Technically difficult study due to poor echo windows. Image acquisition challenging due to patient body habitus. IMPRESSIONS  1. Left ventricular ejection  fraction, by estimation, is 60 to 65%. The left ventricle has normal function. The left ventricle has no regional wall motion abnormalities. Left ventricular diastolic parameters were normal.  2. Right ventricular systolic  function is normal. The right ventricular size is normal.  3. The mitral valve is normal in structure. No evidence of mitral valve regurgitation.  4. The aortic valve is grossly normal. Aortic valve regurgitation is not visualized.  5. The inferior vena cava is normal in size with greater than 50% respiratory variability, suggesting right atrial pressure of 3 mmHg. FINDINGS  Left Ventricle: Left ventricular ejection fraction, by estimation, is 60 to 65%. The left ventricle has normal function. The left ventricle has no regional wall motion abnormalities. Definity contrast agent was given IV to delineate the left ventricular  endocardial borders. The left ventricular internal cavity size was normal in size. There is no left ventricular hypertrophy. Left ventricular diastolic parameters were normal. Right Ventricle: The right ventricular size is normal. No increase in right ventricular wall thickness. Right ventricular systolic function is normal. Left Atrium: Left atrial size was normal in size. Right Atrium: Right atrial size was normal in size. Pericardium: There is no evidence of pericardial effusion. Mitral Valve: The mitral valve is normal in structure. No evidence of mitral valve regurgitation. MV peak gradient, 6.9 mmHg. The mean mitral valve gradient is 2.0 mmHg. Tricuspid Valve: The tricuspid valve is grossly normal. Tricuspid valve regurgitation is not demonstrated. Aortic Valve: The aortic valve is grossly normal. Aortic valve regurgitation is not visualized. Aortic valve mean gradient measures 5.0 mmHg. Aortic valve peak gradient measures 8.2 mmHg. Aortic valve area, by VTI measures 2.96 cm. Pulmonic Valve: The pulmonic valve was normal in structure. Pulmonic valve regurgitation is not  visualized. Aorta: The aortic root is normal in size and structure. Venous: The inferior vena cava is normal in size with greater than 50% respiratory variability, suggesting right atrial pressure of 3 mmHg. IAS/Shunts: No atrial level shunt detected by color flow Doppler.  LEFT VENTRICLE PLAX 2D LVOT diam:     2.10 cm     Diastology LV SV:         76          LV e' medial:    7.51 cm/s LV SV Index:   38          LV E/e' medial:  11.7 LVOT Area:     3.46 cm    LV e' lateral:   8.59 cm/s                            LV E/e' lateral: 10.2  LV Volumes (MOD) LV vol d, MOD A2C: 57.5 ml LV vol d, MOD A4C: 53.9 ml LV vol s, MOD A2C: 21.0 ml LV vol s, MOD A4C: 16.5 ml LV SV MOD A2C:     36.5 ml LV SV MOD A4C:     53.9 ml LV SV MOD BP:      38.1 ml LEFT ATRIUM           Index LA Vol (A4C): 33.8 ml 16.75 ml/m  AORTIC VALVE                    PULMONIC VALVE AV Area (Vmax):    2.86 cm     PV Vmax:       1.38 m/s AV Area (Vmean):   2.84 cm     PV Vmean:      85.700 cm/s AV Area (VTI):     2.96 cm     PV VTI:        0.229 m AV Vmax:  143.00 cm/s  PV Peak grad:  7.6 mmHg AV Vmean:          108.000 cm/s PV Mean grad:  3.0 mmHg AV VTI:            0.257 m AV Peak Grad:      8.2 mmHg AV Mean Grad:      5.0 mmHg LVOT Vmax:         118.00 cm/s LVOT Vmean:        88.500 cm/s LVOT VTI:          0.220 m LVOT/AV VTI ratio: 0.86  AORTA Ao Root diam: 2.70 cm MITRAL VALVE MV Area (PHT): 3.87 cm    SHUNTS MV Peak grad:  6.9 mmHg    Systemic VTI:  0.22 m MV Mean grad:  2.0 mmHg    Systemic Diam: 2.10 cm MV Vmax:       1.31 m/s MV Vmean:      66.6 cm/s MV Decel Time: 196 msec MV E velocity: 87.80 cm/s MV A velocity: 95.50 cm/s MV E/A ratio:  0.92 Debbe OdeaBrian Agbor-Etang MD Electronically signed by Debbe OdeaBrian Agbor-Etang MD Signature Date/Time: 04/30/2020/4:41:17 PM    Final         Scheduled Meds:  aspirin EC  81 mg Oral Daily   atorvastatin  20 mg Oral Daily   Chlorhexidine Gluconate Cloth  6 each Topical Daily   enoxaparin  (LOVENOX) injection  0.5 mg/kg Subcutaneous Q24H   famotidine  20 mg Oral Daily   insulin aspart  0-9 Units Subcutaneous TID WC   insulin glargine  20 Units Subcutaneous Daily   metoprolol succinate  12.5 mg Oral Daily   multivitamin with minerals  1 tablet Oral Daily   Ensure Max Protein  11 oz Oral BID BM   sodium bicarbonate  1,300 mg Oral BID   sodium hypochlorite   Irrigation Once   Continuous Infusions:  albumin human Stopped (04/30/20 0520)   linezolid (ZYVOX) IV Stopped (05/01/20 2258)   piperacillin-tazobactam (ZOSYN)  IV 3.375 g (05/02/20 0537)     LOS: 2 days    Time spent: 35 minutes    Marrion Coyekui Shwanda Soltis, MD [facility]   To contact the attending provider between 7A-7P or the covering provider during after hours 7P-7A, please log into the web site [email or web address] and access using universal [place] password for that web site. If you do not have the password, please call the hospital operator.  05/02/2020, 8:24 AM

## 2020-05-02 NOTE — Progress Notes (Addendum)
ID Michelle Stark is a 64 y.o. female admitted 10/19 with  Nausea , vomiting and excess thirst.   Found to have blood sugar of 404 in the ED. No h/o diabetes before. She had left sided flank pain and left groin pain and passed a kidney stone In the ED vitals were temperature of 96, BP 140/110, respiratory rate 36, HR of 122. Labs revealed WBC of 35.3, Hb of 16, platelet of 594. Blood glucose was 408, creatinine 1.42, potassium 3.4, anion gap 19, CO2 11,.  High sensitive troponin was increased at 2104.  Marland Kitchen  Blood cultures were sent. She was diagnosed with DKA and started on IV fluids and insulin. She was noted to have extensive perianal gluteal erythema on the right side concerning for cellulitis.  She was started on vancomycin, Zosyn and clindamycin.  She was seen by surgeon who took her urgently for excisional debridement of the right perianal soft tissue for necrotizing infection.  Cultures were sent. Post surgery she was in the ICU   10/22- repeat debridement done today - per note Examination revealed some persistent necrosis on the posterior aspect of the wound.  There is also involved skin subcutaneous tissue and some muscle.  Please note that some of the real border of the gluteus was involved.  This was excised with cautery. Excisional debridement was performed with electrocautery,cooperating skin subcutaneous tissue and fascia and also with my finger dissection breaking down all the loculations and devitalized tissue. There is also significant cranial tracking posterior to the bone.  Wound vac placed.  AF, HR BP stable. WBC down significantly to 9, sugars improving.     Vitals Patient Vitals for the past 24 hrs:  BP Temp Temp src Pulse Resp SpO2  05/02/20 1427 (!) 144/77 -- -- 84 -- --  05/02/20 1426 (!) 162/75 97.7 F (36.5 C) Oral 84 16 99 %  05/02/20 1313 (!) 142/80 97.9 F (36.6 C) Oral 80 (!) 24 98 %  05/02/20 1236 (!) 151/71 -- -- 82 -- 97 %  05/02/20 1233 -- 98.9 F  (37.2 C) -- -- -- --  05/02/20 1144 (!) 116/51 98.4 F (36.9 C) -- 81 (!) 22 97 %  05/02/20 1129 (!) 120/58 -- -- 85 (!) 24 97 %  05/02/20 1114 (!) 104/52 -- -- 87 (!) 25 97 %  05/02/20 1059 (!) 105/52 -- -- 88 (!) 22 100 %  05/02/20 1044 (!) 101/46 99.2 F (37.3 C) -- 88 (!) 21 99 %  05/02/20 0800 128/80 -- -- 71 16 96 %  05/02/20 0700 118/66 -- -- 73 15 95 %  05/02/20 0600 (!) 122/51 -- -- 75 12 96 %  05/02/20 0500 (!) 148/89 -- -- 77 14 97 %  05/02/20 0400 129/76 -- -- 74 18 95 %  05/02/20 0300 133/70 97.8 F (36.6 C) Axillary 78 19 95 %  05/02/20 0200 123/74 -- -- 76 (!) 8 95 %  05/02/20 0100 (!) 109/53 -- -- 80 16 96 %  05/02/20 0000 -- -- -- 72 17 94 %  05/01/20 2200 (!) 146/80 -- -- 88 -- 96 %  05/01/20 2100 129/70 -- -- 85 -- 92 %  05/01/20 2000 108/83 -- -- 81 -- 96 %  05/01/20 1926 (!) 148/70 98.3 F (36.8 C) Axillary 86 -- 96 %  05/01/20 1900 -- -- -- 86 -- 97 %  05/01/20 1800 -- -- -- 91 -- 96 %  05/01/20 1700 -- -- -- 83 -- 95 %  More alert Says she is feeling better Chest b/la ir entry [id number] abd soft Perineal dressing not removed   CBC Latest Ref Rng & Units 05/02/2020 05/01/2020 04/30/2020  WBC 4.0 - 10.5 K/uL 9.4 13.8(H) 16.3(H)  Hemoglobin 12.0 - 15.0 g/dL 11.0(L) 11.6(L) 12.7  Hematocrit 36 - 46 % 30.9(L) 32.6(L) 36.6  Platelets 150 - 400 K/uL 260 284 318   CMP Latest Ref Rng & Units 05/02/2020 05/01/2020 05/01/2020  Glucose 70 - 99 mg/dL 563(O) 756(E) 332(R)  BUN 8 - 23 mg/dL 21 51(O) 84(Z)  Creatinine 0.44 - 1.00 mg/dL 6.60 6.30 1.60  Sodium 135 - 145 mmol/L 143 142 140  Potassium 3.5 - 5.1 mmol/L 3.6 3.4(L) 3.5  Chloride 98 - 111 mmol/L 116(H) 116(H) 114(H)  CO2 22 - 32 mmol/L 20(L) 17(L) 15(L)  Calcium 8.9 - 10.3 mg/dL 8.1(L) 8.3(L) 8.3(L)  Total Protein 6.5 - 8.1 g/dL - - -  Total Bilirubin 0.3 - 1.2 mg/dL - - -  Alkaline Phos 38 - 126 U/L - - -  AST 15 - 41 U/L - - -  ALT 0 - 44 U/L - - -    Micro wound cx from leg - Strep  agalactiae.   Abscess Gram stain mixed. Cx with E coli, Grp B strep.   BCX neg   Impression/recommendation Newly diagnosed Diabetes found to be in DKA Being corrected  Necrotizing fascitis of the perianal/gluteal /perineal ( rt ) area- s/p excisional debridement Currently on zosyn and linezolid Continue for now but will stop linezolid if no MRSA identified  Severe leucocytosis secondary to the infection and DKA- much improved  AKI due to dehydration -much improved Increase in troponin- seen by cardiology Left calf ulcerating wound-culture sent- r/o MRSA

## 2020-05-02 NOTE — Progress Notes (Signed)
Met w/ pt's son Audelia Acton down in the [employer] to discuss pt's new diagnosis (pt was off floor down in the OR having surgery).  Discussed A1C results with son and explained what an A1C is, basic pathophysiology of DM Type 2, basic home care, basic diabetes diet nutrition principles, importance of checking CBGs and maintaining good CBG control to prevent long-term and short-term complications.  Reviewed signs and symptoms of hyperglycemia and hypoglycemia and how to treat hypoglycemia at home.  Also reviewed blood sugar goals and A1c goals for home.    Also Educated pt's son on insulin pen use at home.  Reviewed all steps of insulin pen including attachment of needle, 2-unit air shot, dialing up dose, giving injection, rotation of injection sites, removing needle, disposal of sharps, storage of unused insulin, disposal of insulin etc.  Son able to provide successful return demonstration.  Reviewed troubleshooting with insulin pen.   RNs to provide ongoing basic DM education at bedside with this patient.  Educational booklet, insulin starter kit have been ordered.  Have also placed RD consult for DM diet education for this patient.  Will need to meet with pt again once appropriate to further review new diagnosis.    --Will follow patient during hospitalization--  Wyn Quaker RN, MSN, CDE Diabetes Coordinator Inpatient Glycemic Control Team Team Pager: [phone] (8a-5p)

## 2020-05-02 NOTE — Anesthesia Procedure Notes (Signed)
Procedure Name: Intubation Performed by: Darliss Cheney, CRNA Pre-anesthesia Checklist: Patient identified, Emergency Drugs available, Suction available and Patient being monitored Patient Re-evaluated:Patient Re-evaluated prior to induction Oxygen Delivery Method: Circle system utilized Preoxygenation: Pre-oxygenation with 100% oxygen Induction Type: IV induction Laryngoscope Size: McGraph and 3 Grade View: Grade II Tube type: Oral Tube size: 7.0 mm Number of attempts: 1 Airway Equipment and Method: Stylet Placement Confirmation: ETT inserted through vocal cords under direct vision,  positive ETCO2 and breath sounds checked- equal and bilateral Secured at: 20 cm Tube secured with: Tape Dental Injury: Teeth and Oropharynx as per pre-operative assessment

## 2020-05-02 NOTE — Op Note (Signed)
PATIENT:  Michelle Stark  64 y.o. 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  2.  Wound VAC placement perianal area  FINDINGS;  Evidence of  Some persistent necrosis, soft tissue infection including a portion on the medial aspect of the gluteus maximus Cavity measures 8 cm in depth, 6 cm in length and 4 cm in width    SURGEON:  Surgeon(s) and Role:    * Landri Dorsainvil F, MD - Primary  EBL: 10cc  ANESTHESIA: GETA   DICTATION:  Patient was explained  about the procedure in detail. Risks, benefits and possible complications and a consent was obtained. The patient taken to the operating room and placed in the lithotomy position.  Packing was removed and she was prepped and draped in the usual fashion. Examination revealed some persistent necrosis on the posterior aspect of the wound.  There is also involved skin subcutaneous tissue and some muscle.  Please note that some of the real border of the gluteus was involved.  This was excised with cautery. Excisional debridement was performed with electrocautery, cooperating skin subcutaneous tissue and fascia and also with my finger dissection breaking down all the loculations and devitalized tissue. There is also significant cranial tracking posterior to the bone. Hemostasis was obtained with electrocautery. Pulse lavage irrigation was performed with 3 L of normal saline. The wound was tried Mastisol applied and then DuoDERM was attached to the edges of the wound.  The wound on the medial portion was in very close proximity to the anal verge making this very difficult.  I was able to tailor a sponge and place a wound VAC in the standard fashion.  There Was no evidence of any leaks  Needle and laparotomy counts were correct and  there were no immediate complications.  She will need another wound VAC changed in the next 72 hours.  Michelle Missey Ronnette Juniper, MD

## 2020-05-02 NOTE — Progress Notes (Signed)
Nutrition Brief Note  RD received another consult for diet education regarding DM. Per consult request is to revisit with patient and patient's family next week 10/25 or after to further review diabetes diet info. Patient's son Vincenza Hews would like to be present for education. RD will follow-up next week if patient remains admitted for further review of diet education.  Felix Pacini, MS, RD, LDN Pager number available on Amion

## 2020-05-03 ENCOUNTER — Encounter: Payer: Self-pay | Admitting: Surgery

## 2020-05-03 LAB — GLUCOSE, CAPILLARY
Glucose-Capillary: 244 mg/dL — ABNORMAL HIGH (ref 70–99)
Glucose-Capillary: 324 mg/dL — ABNORMAL HIGH (ref 70–99)
Glucose-Capillary: 245 mg/dL — ABNORMAL HIGH (ref 70–99)
Glucose-Capillary: 176 mg/dL — ABNORMAL HIGH (ref 70–99)

## 2020-05-03 LAB — CBC WITH DIFFERENTIAL/PLATELET
Abs Immature Granulocytes: 0.17 10*3/uL — ABNORMAL HIGH (ref 0.00–0.07)
Basophils Absolute: 0 10*3/uL (ref 0.0–0.1)
Basophils Relative: 0 %
Eosinophils Absolute: 0 10*3/uL (ref 0.0–0.5)
Eosinophils Relative: 0 %
HCT: 32.6 % — ABNORMAL LOW (ref 36.0–46.0)
Hemoglobin: 11.1 g/dL — ABNORMAL LOW (ref 12.0–15.0)
Immature Granulocytes: 2 %
Lymphocytes Relative: 18 %
Lymphs Abs: 2 10*3/uL (ref 0.7–4.0)
MCH: 31.2 pg (ref 26.0–34.0)
MCHC: 34 g/dL (ref 30.0–36.0)
MCV: 91.6 fL (ref 80.0–100.0)
Monocytes Absolute: 0.7 10*3/uL (ref 0.1–1.0)
Monocytes Relative: 6 %
Neutro Abs: 8.3 10*3/uL — ABNORMAL HIGH (ref 1.7–7.7)
Neutrophils Relative %: 74 %
Platelets: 251 10*3/uL (ref 150–400)
RBC: 3.56 MIL/uL — ABNORMAL LOW (ref 3.87–5.11)
RDW: 14.2 % (ref 11.5–15.5)
WBC: 11.2 10*3/uL — ABNORMAL HIGH (ref 4.0–10.5)
nRBC: 0 % (ref 0.0–0.2)

## 2020-05-03 LAB — AEROBIC/ANAEROBIC CULTURE W GRAM STAIN (SURGICAL/DEEP WOUND)

## 2020-05-03 LAB — BASIC METABOLIC PANEL
Anion gap: 10 (ref 5–15)
BUN: 18 mg/dL (ref 8–23)
CO2: 22 mmol/L (ref 22–32)
Calcium: 8.3 mg/dL — ABNORMAL LOW (ref 8.9–10.3)
Chloride: 110 mmol/L (ref 98–111)
Creatinine, Ser: 0.65 mg/dL (ref 0.44–1.00)
GFR, Estimated: >60 mL/min (ref >60)
Glucose, Bld: 277 mg/dL — ABNORMAL HIGH (ref 70–99)
Potassium: 3.8 mmol/L (ref 3.5–5.1)
Sodium: 142 mmol/L (ref 135–145)

## 2020-05-03 LAB — MAGNESIUM: Magnesium: 1.9 mg/dL (ref 1.7–2.4)

## 2020-05-03 LAB — PHOSPHORUS: Phosphorus: 1.6 mg/dL — ABNORMAL LOW (ref 2.5–4.6)

## 2020-05-03 MED ORDER — SODIUM PHOSPHATES 45 MMOLE/15ML IV SOLN: 40.0000 mmol | Freq: Once | INTRAVENOUS | Status: DC

## 2020-05-03 MED ORDER — POTASSIUM & SODIUM PHOSPHATES 280-160-250 MG PO PACK
2.0000 | PACK | Freq: Three times a day (TID) | ORAL | Status: AC
  Administered 2020-05-03 – 2020-05-04 (×4): 2 via ORAL
  Filled 2020-05-03 (×4): qty 2

## 2020-05-03 MED ORDER — INSULIN GLARGINE 100 UNIT/ML ~~LOC~~ SOLN
36.0000 [IU] | Freq: Once | SUBCUTANEOUS | Status: AC
  Administered 2020-05-03: 36 [IU] via SUBCUTANEOUS
  Filled 2020-05-03: qty 0.36

## 2020-05-03 MED ORDER — INSULIN GLARGINE 100 UNIT/ML ~~LOC~~ SOLN
28.0000 [IU] | Freq: Every day | SUBCUTANEOUS | Status: DC
  Filled 2020-05-03: qty 0.28

## 2020-05-03 MED ORDER — INSULIN GLARGINE 100 UNIT/ML ~~LOC~~ SOLN
36.0000 [IU] | Freq: Every day | SUBCUTANEOUS | Status: DC
  Administered 2020-05-04 – 2020-05-06 (×3): 36 [IU] via SUBCUTANEOUS
  Filled 2020-05-03 (×3): qty 0.36

## 2020-05-03 MED ORDER — K PHOS MONO-SOD PHOS DI & MONO 155-852-130 MG PO TABS
250.0000 mg | ORAL_TABLET | Freq: Two times a day (BID) | ORAL | Status: DC
  Filled 2020-05-03 (×2): qty 1

## 2020-05-03 NOTE — Progress Notes (Signed)
This is a patient of Dr. Hurman Horn, seen on rounds today.  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  She underwent an initial debridement with Dr. Everlene Farrier on the day of presentation at around 3 AM.  She was taken to the OR yesterday for a second look.  Additional debridement was performed and a wound VAC was placed.  Labs today are essentially stable from prior.  Today, she reports that her pain is well controlled.  She is tolerating a diet without difficulty.  Today's Vitals   05/02/20 1924 05/03/20 0103 05/03/20 0326 05/03/20 0826  BP: (!) 147/60 (!) 135/59 (!) 125/57 (!) 135/54  Pulse: 84 83 88 82  Resp: 20 20 18    Temp: 98.2 F (36.8 C) 98 F (36.7 C) 98.3 F (36.8 C) 97.9 F (36.6 C)  TempSrc: Oral Oral Oral Oral  SpO2: 99% 97% 95% 98%  Weight:      Height:      PainSc: Asleep      Body mass index is 41.53 kg/m. I/O last 3 completed shifts: In: 3379.3 [I.V.:2529.3; IV Piggyback:850] Out: 3075 [Urine:2900; Drains:125; Blood:50] No intake/output data recorded.  General: She is alert and oriented and in no acute distress Wound: The wound VAC is adherent without leaks.  There is serosanguineous fluid in the canister.  Results for JEYDA, SIEBEL (MRN Julien Girt) as of 05/03/2020 11:36  Ref. Range 05/03/2020 06:34  Sodium Latest Ref Range: 135 - 145 mmol/L 142  Potassium Latest Ref Range: 3.5 - 5.1 mmol/L 3.8  Chloride Latest Ref Range: 98 - 111 mmol/L 110  CO2 Latest Ref Range: 22 - 32 mmol/L 22  Glucose Latest Ref Range: 70 - 99 mg/dL 05/05/2020 (H)  BUN Latest Ref Range: 8 - 23 mg/dL 18  Creatinine Latest Ref Range: 0.44 - 1.00 mg/dL 299  Calcium Latest Ref Range: 8.9 - 10.3 mg/dL 8.3 (L)  Anion gap Latest Ref Range: 5 - 15  10  Phosphorus Latest Ref Range: 2.5 - 4.6 mg/dL 1.6 (L)  Magnesium Latest Ref Range: 1.7 - 2.4 mg/dL 1.9  GFR, Estimated Latest Ref Range: >60  mL/min >60  WBC Latest Ref Range: 4.0 - 10.5 K/uL 11.2 (H)  RBC Latest Ref Range: 3.87 - 5.11 MIL/uL 3.56 (L)  Hemoglobin Latest Ref Range: 12.0 - 15.0 g/dL 2.42 (L)  HCT Latest Ref Range: 36 - 46 % 32.6 (L)  MCV Latest Ref Range: 80.0 - 100.0 fL 91.6  MCH Latest Ref Range: 26.0 - 34.0 pg 31.2  MCHC Latest Ref Range: 30.0 - 36.0 g/dL 68.3  RDW Latest Ref Range: 11.5 - 15.5 % 14.2  Platelets Latest Ref Range: 150 - 400 K/uL 251  nRBC Latest Ref Range: 0.0 - 0.2 % 0.0  Neutrophils Latest Units: % 74  Lymphocytes Latest Units: % 18  Monocytes Relative Latest Units: % 6  Eosinophil Latest Units: % 0  Basophil Latest Units: % 0  Immature Granulocytes Latest Units: % 2  NEUT# Latest Ref Range: 1.7 - 7.7 K/uL 8.3 (H)  Lymphocyte # Latest Ref Range: 0.7 - 4.0 K/uL 2.0  Monocyte # Latest Ref Range: 0.1 - 1.0 K/uL 0.7  Eosinophils Absolute Latest Ref Range: 0.0 - 0.5 K/uL 0.0  Basophils Absolute Latest Ref Range: 0.0 - 0.1 K/uL 0.0  Abs Immature Granulocytes Latest Ref Range: 0.00 - 0.07 K/uL 0.17 (H)   Impression and plan: This  is a 64 year old woman who presented in DKA and was found to have necrotizing fasciitis.  She is undergone 2 debridements.  She is currently on broad-spectrum antibiotic coverage.  Sensitivities from OR cultures are pending.  Dr. Everlene Farrier plans to take her to the operating room on Monday for an examination under anesthesia, possible additional debridement, and wound VAC change.  She should be made n.p.o. after midnight for that procedure.

## 2020-05-03 NOTE — Progress Notes (Addendum)
PROGRESS NOTE    Michelle LOGE  Stark:[id number] DOB: 07-16-1955 DOA: 04/29/2020 PCP: Patient, No Pcp Per   Chief complaint.  Follow-up on DKA. Brief Narrative:  Michelle Friedt Newsomeis a 64 y.o.femalewith medical history significant of HTN, obesity. HLD.Presented withN/V/ increased thirst and fatigue. Patient was unaware she is diabetic Her BG per EMS was 404.Upon arriving the hospital, her CO2 level was less than 7, she had severe metabolic acidosis. Insulin drip was started. Patient was also seen by general surgery for decubitus sacral wound, had surgical I&D performed on 10/20. Currently covered with antibiotics with Zosyn and vancomycin.  10/21.  Patient is seen by ID, antibiotics switched to Zyvox and Zosyn. 10/22.  Patient going back to the OR for ID.  Patient anion gap has closed for 24 hours, CO2 level now 20, patient has mild non-anion gap metabolic acidosis.  Continued sodium bicarb tablet, discontinue insulin drip, start Lantus.   Assessment & Plan:   Active Problems:   Hypertension   HLD (hyperlipidemia)   Obesity   DKA (diabetic ketoacidosis) (HCC)   AKI (acute kidney injury) (HCC)   Dehydration   Metabolic acidosis due to diabetes mellitus (HCC)   Sepsis (HCC)   Abscess and cellulitis of gluteal region   Elevated troponin   Reactive thrombocytosis  #1. Uncontrolled type 2 diabetes with diabetic ketoacidosis. Condition had improved.  CO2 level also normalized. I will discontinue sodium bicarb tablets. Patient was given 28 units of Lantus yesterday, glucose still running high, increase dose again to 36 units.  Monitor glucose closely.  2.  Hypophosphatemia. Discussed with pharmacy, there is a Sport and exercise psychologist of sodium phosphate, patient did not receive sodium phosphate yesterday.  Potassium phosphate was dispensed to the floor, was not given. We will give 10mM of the potassium phosphate dispensed, also started Neutra-Phos orally.  Recheck Phos level  tomorrow.  3.  Hypokalemia. Improved.  #4.  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 Patient has been debrided twice by general surgery.  Followed by infect disease, continue Zosyn and Zyvox.  #5.  Non-STEMI. Secondary to demand ischemia.  6.  Acute kidney injury. Renal function improved.     DVT prophylaxis: Lovenox Code Status: Full Family Communication: Son updated. .   Status is: Inpatient  Remains inpatient appropriate because:Inpatient level of care appropriate due to severity of illness   Dispo: The patient is from: Home              Anticipated d/c is to:?              Anticipated d/c date is: > 3 days              Patient currently is not medically stable to d/c.        I/O last 3 completed shifts: In: 3379.3 [I.V.:2529.3; IV Piggyback:850] Out: 3075 [Urine:2900; Drains:125; Blood:50] No intake/output data recorded.     Consultants:   GS, ID  Procedures: I&D  Antimicrobials: Zosyn and Zyvox.  Subjective: Patient slept well last night.  She still has a poor appetite, but her sugar is running high.  No nausea vomiting abdominal pain. No fever or chills. No short of breath or cough. No dysuria hematuria. No headache or dizziness.  Objective: Vitals:   05/02/20 1924 05/03/20 0103 05/03/20 0326 05/03/20 0826  BP: (!) 147/60 (!) 135/59 (!) 125/57 (!) 135/54  Pulse: 84 83 88 82  Resp: Temp: 98.2 F (36.8 C) 98 F (36.7 C) 98.3  F (36.8 C) 97.9 F (36.6 C)  TempSrc: Oral Oral Oral Oral  SpO2: 99% 97% 95% 98%  Weight:      Height:        Intake/Output Summary (Last 24 hours) at 05/03/2020 1101 Last data filed at 05/03/2020 0610 Gross per 24 hour  Intake 0 ml  Output 1925 ml  Net -1925 ml   Filed Weights   04/29/20 1418 04/30/20 0828  Weight: 82.6 kg 103 kg    Examination:  General exam: Appears calm and comfortable  Respiratory system: Clear to auscultation. Respiratory effort normal. Cardiovascular system:  S1 & S2 heard, RRR. No JVD, murmurs, rubs, gallops or clicks. No pedal edema. Gastrointestinal system: Abdomen is nondistended, soft and nontender. No organomegaly or masses felt. Normal bowel sounds heard. Central nervous system: Alert and oriented x2. No focal neurological deficits. Extremities: Symmetric  Skin: Sacrum ulcer Psychiatry: Mood & affect appropriate.     Data Reviewed: I have personally reviewed following labs and imaging studies  CBC: Recent Labs  Lab 04/29/20 1411 04/30/20 2058 05/01/20 0728 05/02/20 0626 05/03/20 0634  WBC 35.6*  35.3* 16.3* 13.8* 9.4 11.2*  NEUTROABS 31.1*  --  11.5* 6.9 8.3*  HGB 16.0*  16.0* 12.7 11.6* 11.0* 11.1*  HCT 47.9*  47.2* 36.6 32.6* 30.9* 32.6*  MCV 93.7  92.0 90.4 88.1 87.5 91.6  PLT 608*  594* 318 284 260 251   Basic Metabolic Panel: Recent Labs  Lab 04/29/20 1725 04/29/20 2151 05/01/20 0043 05/01/20 0728 05/01/20 1259 05/01/20 2133 05/02/20 0626 05/03/20 0634  NA 134*   < > 142  --  140 142 143 142  K 3.4*   < > 3.2*  --  3.5 3.4* 3.6 3.8  CL 106   < > 118*  --  114* 116* 116* 110  CO2 <7*   < > 16*  --  15* 17* 20* 22  GLUCOSE 527*   < > 175*  --  186* 199* 180* 277*  BUN 43*   < > 36*  --  31* 25* 21 18  CREATININE 1.37*   < > 0.93  --  0.87 0.80 0.65 0.65  CALCIUM 8.1*   < > 8.4*  --  8.3* 8.3* 8.1* 8.3*  MG 2.2  --   --  1.8  --   --  1.8 1.9  PHOS 4.4  --   --   --   --   --  <1.0* 1.6*   < > = values in this interval not displayed.   GFR: Estimated Creatinine Clearance: 80 mL/min (by C-G formula based on SCr of 0.65 mg/dL). Liver Function Tests: Recent Labs  Lab 04/30/20 1246  AST 41  ALT 27  ALKPHOS 96  BILITOT 0.6  PROT 6.2*  ALBUMIN 2.4*   Recent Labs  Lab 04/29/20 1725  LIPASE 90*   No results for input(s): AMMONIA in the last 168 hours. Coagulation Profile: Recent Labs  Lab 04/29/20 2155  INR 1.3*   Cardiac Enzymes: Recent Labs  Lab 04/29/20 1725  CKTOTAL 219   BNP (last  3 results) No results for input(s): PROBNP in the last 8760 hours. HbA1C: No results for input(s): HGBA1C in the last 72 hours. CBG: Recent Labs  Lab 05/02/20 1047 05/02/20 1230 05/02/20 1724 05/02/20 2103 05/03/20 0740  GLUCAP 180* 228* 279* 250* 244*   Lipid Profile: No results for input(s): CHOL, HDL, LDLCALC, TRIG, CHOLHDL, LDLDIRECT in the last 72 hours. Thyroid Function Tests: No  results for input(s): TSH, T4TOTAL, FREET4, T3FREE, THYROIDAB in the last 72 hours. Anemia Panel: No results for input(s): VITAMINB12, FOLATE, FERRITIN, TIBC, IRON, RETICCTPCT in the last 72 hours. Sepsis Labs: Recent Labs  Lab 04/29/20 1725 04/29/20 1947 04/29/20 2155 04/30/20 0457  PROCALCITON 1.22  --   --   --   LATICACIDVEN  --  2.0* 2.5* 1.5    Recent Results (from the past 240 hour(s))  Respiratory Panel by RT PCR (Flu A&B, Covid) - Nasopharyngeal Swab     Status: None   Collection Time: 04/29/20  3:05 PM   Specimen: Nasopharyngeal Swab  Result Value Ref Range Status   SARS Coronavirus 2 by RT PCR NEGATIVE NEGATIVE Final    Comment: (NOTE) SARS-CoV-2 target nucleic acids are NOT DETECTED.  The SARS-CoV-2 RNA is generally detectable in upper respiratoy specimens during the acute phase of infection. The lowest concentration of SARS-CoV-2 viral copies this assay can detect is 131 copies/mL. A negative result does not preclude SARS-Cov-2 infection and should not be used as the sole basis for treatment or other patient management decisions. A negative result may occur with  improper specimen collection/handling, submission of specimen other than nasopharyngeal swab, presence of viral mutation(s) within the areas targeted by this assay, and inadequate number of viral copies (<131 copies/mL). A negative result must be combined with clinical observations, patient history, and epidemiological information. The expected result is Negative.  Fact Sheet for Patients:    https://www.moore.com/https://www.fda.gov/media/142436/download  Fact Sheet for Healthcare Providers:  https://www.young.biz/https://www.fda.gov/media/142435/download  This test is no t yet approved or cleared by the [place] FDA and  has been authorized for detection and/or diagnosis of SARS-CoV-2 by FDA under an Emergency Use Authorization (EUA). This EUA will remain  in effect (meaning this test can be used) for the duration of the COVID-19 declaration under Section 564(b)(1) of the Act, 21 U.S.C. section 360bbb-3(b)(1), unless the authorization is terminated or revoked sooner.     Influenza A by PCR NEGATIVE NEGATIVE Final   Influenza B by PCR NEGATIVE NEGATIVE Final    Comment: (NOTE) The Xpert Xpress SARS-CoV-2/FLU/RSV assay is intended as an aid in  the diagnosis of influenza from Nasopharyngeal swab specimens and  should not be used as a sole basis for treatment. Nasal washings and  aspirates are unacceptable for Xpert Xpress SARS-CoV-2/FLU/RSV  testing.  Fact Sheet for Patients: https://www.moore.com/https://www.fda.gov/media/142436/download  Fact Sheet for Healthcare Providers: https://www.young.biz/https://www.fda.gov/media/142435/download  This test is not yet approved or cleared by the [place] FDA and  has been authorized for detection and/or diagnosis of SARS-CoV-2 by  FDA under an Emergency Use Authorization (EUA). This EUA will remain  in effect (meaning this test can be used) for the duration of the  Covid-19 declaration under Section 564(b)(1) of the Act, 21  U.S.C. section 360bbb-3(b)(1), unless the authorization is  terminated or revoked. Performed at [facility], [address] [place], [place] [address]   Blood culture (routine x 2)     Status: None (Preliminary result)   Collection Time: 04/29/20  3:39 PM   Specimen: BLOOD  Result Value Ref Range Status   Specimen Description BLOOD BLOOD LEFT FOREARM  Final   Special Requests   Final    BOTTLES DRAWN AEROBIC AND ANAEROBIC Blood Culture adequate volume    Culture   Final    NO GROWTH 4 DAYS Performed at [facility], [address] [place], [place] [address]    Report Status PENDING  Incomplete  Blood  culture (routine x 2)     Status: None (Preliminary result)   Collection Time: 04/29/20  3:44 PM   Specimen: BLOOD  Result Value Ref Range Status   Specimen Description BLOOD BLOOD RIGHT FOREARM  Final   Special Requests   Final    BOTTLES DRAWN AEROBIC AND ANAEROBIC Blood Culture adequate volume   Culture   Final    NO GROWTH 4 DAYS Performed at [facility], [address] [place], [place] [address]    Report Status PENDING  Incomplete  Urine culture     Status: Abnormal   Collection Time: 04/29/20  9:00 PM   Specimen: Urine, Catheterized  Result Value Ref Range Status   Specimen Description   Final    URINE, CATHETERIZED Performed at [facility], [address], [place], [place] [address]    Special Requests   Final    NONE Performed at [facility], [address] [place], [place] [address]    Culture MULTIPLE SPECIES PRESENT, SUGGEST RECOLLECTION (A)  Final   Report Status 05/01/2020 FINAL  Final  Aerobic/Anaerobic Culture (surgical/deep wound)     Status: None (Preliminary result)   Collection Time: 04/30/20  2:35 AM   Specimen: PATH Other; Wound  Result Value Ref Range Status   Specimen Description   Final    PERIRECTAL ABSCESS Performed at [facility], [address] [place], [place] [address]    Special Requests   Final    NONE Performed at [facility], [address] [place], [place] [address]    Gram Stain   Final    ABUNDANT WBC PRESENT, PREDOMINANTLY PMN ABUNDANT GRAM POSITIVE COCCI ABUNDANT GRAM NEGATIVE RODS ABUNDANT GRAM NEGATIVE COCCI    Culture   Final    ABUNDANT ESCHERICHIA COLI ABUNDANT GROUP B STREP(S.AGALACTIAE)ISOLATED TESTING AGAINST S. AGALACTIAE NOT ROUTINELY PERFORMED DUE TO PREDICTABILITY OF AMP/PEN/VAN  SUSCEPTIBILITY. Performed at [facility], [address] [place], [place] [address]    Report Status PENDING  Incomplete   Organism ID, Bacteria ESCHERICHIA COLI  Final      Susceptibility   Escherichia coli - MIC*    AMPICILLIN >=32 RESISTANT Resistant     CEFAZOLIN <=4 SENSITIVE Sensitive     CEFEPIME <=0.12 SENSITIVE Sensitive     CEFTAZIDIME <=1 SENSITIVE Sensitive     CEFTRIAXONE <=0.25 SENSITIVE Sensitive     CIPROFLOXACIN 0.5 SENSITIVE Sensitive     GENTAMICIN <=1 SENSITIVE Sensitive     IMIPENEM 0.5 SENSITIVE Sensitive     TRIMETH/SULFA <=20 SENSITIVE Sensitive     AMPICILLIN/SULBACTAM 16 INTERMEDIATE Intermediate     PIP/TAZO <=4 SENSITIVE Sensitive     * ABUNDANT ESCHERICHIA COLI  Aerobic/Anaerobic Culture (surgical/deep wound)     Status: None (Preliminary result)   Collection Time: 04/30/20  2:35 AM   Specimen: PATH Other; Wound  Result Value Ref Range Status   Specimen Description   Final    PERIRECTAL ABSCESS Performed at [facility], [address] [place], [place] [address]    Special Requests   Final    NONE Performed at [facility], [address] [place], [place] [address]    Gram Stain   Final    MODERATE WBC PRESENT, PREDOMINANTLY PMN ABUNDANT GRAM NEGATIVE RODS ABUNDANT GRAM POSITIVE COCCI ABUNDANT GRAM NEGATIVE COCCI    Culture   Final    ABUNDANT ESCHERICHIA COLI ABUNDANT GROUP B STREP(S.AGALACTIAE)ISOLATED TESTING AGAINST S. AGALACTIAE NOT ROUTINELY PERFORMED DUE TO  PREDICTABILITY OF AMP/PEN/VAN SUSCEPTIBILITY. Performed at [facility], [address], [place], [place] [address]    Report Status PENDING  Incomplete   Organism ID, Bacteria ESCHERICHIA COLI  Final      Susceptibility   Escherichia coli - MIC*    AMPICILLIN >=32 RESISTANT Resistant     CEFAZOLIN <=4 SENSITIVE Sensitive     CEFEPIME <=0.12 SENSITIVE Sensitive     CEFTAZIDIME <=1 SENSITIVE Sensitive     CEFTRIAXONE <=0.25 SENSITIVE Sensitive      CIPROFLOXACIN <=0.25 SENSITIVE Sensitive     GENTAMICIN <=1 SENSITIVE Sensitive     IMIPENEM <=0.25 SENSITIVE Sensitive     TRIMETH/SULFA <=20 SENSITIVE Sensitive     AMPICILLIN/SULBACTAM 16 INTERMEDIATE Intermediate     PIP/TAZO <=4 SENSITIVE Sensitive     * ABUNDANT ESCHERICHIA COLI  Aerobic/Anaerobic Culture (surgical/deep wound)     Status: None (Preliminary result)   Collection Time: 04/30/20  2:35 AM   Specimen: PATH Other; Tissue  Result Value Ref Range Status   Specimen Description   Final    PERIRECTAL ABSCESS Performed at [facility], [address], [place], [place] [address]    Special Requests   Final    NONE Performed at [facility], [address] [place], [place] [address]    Gram Stain   Final    FEW WBC PRESENT, PREDOMINANTLY PMN ABUNDANT GRAM POSITIVE COCCI ABUNDANT GRAM NEGATIVE RODS    Culture   Final    FEW ESCHERICHIA COLI ABUNDANT GROUP B STREP(S.AGALACTIAE)ISOLATED TESTING AGAINST S. AGALACTIAE NOT ROUTINELY PERFORMED DUE TO PREDICTABILITY OF AMP/PEN/VAN SUSCEPTIBILITY. Performed at [facility], [address] [place], [place] [address]    Report Status PENDING  Incomplete   Organism ID, Bacteria ESCHERICHIA COLI  Final      Susceptibility   Escherichia coli - MIC*    AMPICILLIN >=32 RESISTANT Resistant     CEFAZOLIN <=4 SENSITIVE Sensitive     CEFEPIME <=0.12 SENSITIVE Sensitive     CEFTAZIDIME <=1 SENSITIVE Sensitive     CEFTRIAXONE <=0.25 SENSITIVE Sensitive     CIPROFLOXACIN <=0.25 SENSITIVE Sensitive     GENTAMICIN <=1 SENSITIVE Sensitive     IMIPENEM 0.5 SENSITIVE Sensitive     TRIMETH/SULFA <=20 SENSITIVE Sensitive     AMPICILLIN/SULBACTAM 16 INTERMEDIATE Intermediate     PIP/TAZO <=4 SENSITIVE Sensitive     * FEW ESCHERICHIA COLI  MRSA PCR Screening     Status: None   Collection Time: 04/30/20  8:27 AM   Specimen: Nasal Mucosa; Nasopharyngeal  Result Value Ref Range Status   MRSA by PCR NEGATIVE  NEGATIVE Final    Comment:        The GeneXpert MRSA Assay (FDA approved for NASAL specimens only), is one component of a comprehensive MRSA colonization surveillance program. It is not intended to diagnose MRSA infection nor to guide or monitor treatment for MRSA infections. Performed at [facility], [address] [place], [place] [address]   Aerobic Culture (superficial specimen)     Status: None (Preliminary result)   Collection Time: 04/30/20  9:44 PM   Specimen: Leg  Result Value Ref Range Status   Specimen Description   Final    LEG Performed at [facility], [address] [place], [place] [address]    Special Requests   Final    NONE Performed at [facility], [address] [place], [place] [address]    Gram Stain   Final  NO WBC SEEN MODERATE GRAM POSITIVE COCCI RARE YEAST    Culture   Final    FEW GROUP B STREP(S.AGALACTIAE)ISOLATED TESTING AGAINST S. AGALACTIAE NOT ROUTINELY PERFORMED DUE TO PREDICTABILITY OF AMP/PEN/VAN SUSCEPTIBILITY. FEW YEAST CULTURE REINCUBATED FOR BETTER GROWTH Performed at [facility], [address] [place], [place] [address]    Report Status PENDING  Incomplete         Radiology Studies: No results found.      Scheduled Meds: . aspirin EC  81 mg Oral Daily  . atorvastatin  20 mg Oral Daily  . Chlorhexidine Gluconate Cloth  6 each Topical Daily  . enoxaparin (LOVENOX) injection  0.5 mg/kg Subcutaneous Q24H  . famotidine  20 mg Oral Daily  . insulin aspart  0-9 Units Subcutaneous TID AC & HS  . [START ON 05/04/2020] insulin glargine  36 Units Subcutaneous Daily  . metoprolol succinate  12.5 mg Oral Daily  . multivitamin with minerals  1 tablet Oral Daily  . potassium & sodium phosphates  2 packet Oral TID AC & HS  . Ensure Max Protein  11 oz Oral BID BM  . sodium hypochlorite   Irrigation Once   Continuous Infusions: . albumin human Stopped (04/30/20 0520)  . linezolid  (ZYVOX) IV 600 mg (05/03/20 1056)  . piperacillin-tazobactam (ZOSYN)  IV 3.375 g (05/03/20 0610)     LOS: 3 days    Time spent: 28 minutes    Marrion Coy, MD [facility]   To contact the attending provider between 7A-7P or the covering provider during after hours 7P-7A, please log into the web site [email or web address] and access using universal [place] password for that web site. If you do not have the password, please call the hospital operator.  05/03/2020, 11:01 AM

## 2020-05-03 NOTE — Progress Notes (Addendum)
Inpatient Diabetes Program Recommendations  AACE/ADA: New Consensus Statement on Inpatient Glycemic Control (2015)  Target Ranges:  Prepandial:   less than 140 mg/dL      Peak postprandial:   less than 180 mg/dL (1-2 hours)      Critically ill patients:  140 - 180 mg/dL   Results for Michelle Stark, Michelle Stark (MRN [id number]) as of 05/03/2020 13:37  Ref. Range 05/02/2020 07:06 05/02/2020 08:42 05/02/2020 10:47 05/02/2020 12:30 05/02/2020 17:24 05/02/2020 21:03  Glucose-Capillary Latest Ref Range: 70 - 99 mg/dL 629 (H) 528 (H)  IV Insulin Drip stopped at 8:50am 180 (H)  10 mg Decadron given at 9:30am in surgery 228 (H)  3 units NOVOLOG @2 :51pm +  20 units LANTUS @2 :51pm 279 (H)  5 units NOVOLOG  250 (H)   Results for Michelle Stark, Michelle Stark (MRN ) as of 05/03/2020 13:37  Ref. Range 05/03/2020 07:40 05/03/2020 11:52  Glucose-Capillary Latest Ref Range: 70 - 99 mg/dL 05/05/2020 (H)  3 units NOVOLOG @10 :50am 324 (H)    Admit with:DKA/ New Diagnosis of Diabetes/Necrotizing fascitis of the perianal/gluteal/perineal   Current Orders: Lantus 36 units Daily      Novolog Sensitive Correction Scale/ SSI (0-9 units) TID AC + HS    Pt transitioned off the IV Insulin drip yesterday AM, however, was not transitioned appropriately.  IV Insulin Drip was turned off at 8:50am and the Lantus (basal insulin) was not started until 2:51pm that same day.  CBGs elevated today.  Currently it is 1:48pm and the Lantus 28 units that was due at 10am has not been given.  I noted that Dr. 05/05/2020 increased the Lantus to 36 units for tomorrow (10/24).  Contacted Dr. via Secure Chat and let him know the 28 unit Lantus dose has not been given yet today.  Dr. Chipper Herb placed order for Lantus 36 units X 1 dose to be given asap this afternoon as soon as it arrives from pharmacy.  Sent Secure Chat to [phone] (caring for pt today) and asked her to please give the Lantus 36 unit dose asap and do not give the 28  unit Lantus dose that was due at 10am.   --Will follow patient during hospitalization--  Chipper Herb RN, MSN, CDE Diabetes Coordinator Inpatient Glycemic Control Team Team Pager: [phone] (8a-5p)

## 2020-05-04 LAB — GLUCOSE, CAPILLARY
Glucose-Capillary: 137 mg/dL — ABNORMAL HIGH (ref 70–99)
Glucose-Capillary: 155 mg/dL — ABNORMAL HIGH (ref 70–99)
Glucose-Capillary: 177 mg/dL — ABNORMAL HIGH (ref 70–99)
Glucose-Capillary: 177 mg/dL — ABNORMAL HIGH (ref 70–99)

## 2020-05-04 LAB — CBC WITH DIFFERENTIAL/PLATELET
Abs Immature Granulocytes: 0.16 10*3/uL — ABNORMAL HIGH (ref 0.00–0.07)
Basophils Absolute: 0 10*3/uL (ref 0.0–0.1)
Basophils Relative: 0 %
Eosinophils Absolute: 0.1 10*3/uL (ref 0.0–0.5)
Eosinophils Relative: 1 %
HCT: 32.6 % — ABNORMAL LOW (ref 36.0–46.0)
Hemoglobin: 11 g/dL — ABNORMAL LOW (ref 12.0–15.0)
Immature Granulocytes: 2 %
Lymphocytes Relative: 24 %
Lymphs Abs: 2.3 10*3/uL (ref 0.7–4.0)
MCH: 31.2 pg (ref 26.0–34.0)
MCHC: 33.7 g/dL (ref 30.0–36.0)
MCV: 92.4 fL (ref 80.0–100.0)
Monocytes Absolute: 0.7 10*3/uL (ref 0.1–1.0)
Monocytes Relative: 7 %
Neutro Abs: 6.6 10*3/uL (ref 1.7–7.7)
Neutrophils Relative %: 66 %
Platelets: 243 10*3/uL (ref 150–400)
RBC: 3.53 MIL/uL — ABNORMAL LOW (ref 3.87–5.11)
RDW: 13.7 % (ref 11.5–15.5)
WBC: 9.9 10*3/uL (ref 4.0–10.5)
nRBC: 0.4 % — ABNORMAL HIGH (ref 0.0–0.2)

## 2020-05-04 LAB — BASIC METABOLIC PANEL
Anion gap: 10 (ref 5–15)
BUN: 13 mg/dL (ref 8–23)
CO2: 24 mmol/L (ref 22–32)
Calcium: 7.9 mg/dL — ABNORMAL LOW (ref 8.9–10.3)
Chloride: 106 mmol/L (ref 98–111)
Creatinine, Ser: 0.44 mg/dL (ref 0.44–1.00)
GFR, Estimated: >60 mL/min (ref >60)
Glucose, Bld: 159 mg/dL — ABNORMAL HIGH (ref 70–99)
Potassium: 3.4 mmol/L — ABNORMAL LOW (ref 3.5–5.1)
Sodium: 140 mmol/L (ref 135–145)

## 2020-05-04 LAB — CULTURE, BLOOD (ROUTINE X 2)
Culture: NO GROWTH
Culture: NO GROWTH
Special Requests: ADEQUATE
Special Requests: ADEQUATE

## 2020-05-04 LAB — MAGNESIUM: Magnesium: 1.8 mg/dL (ref 1.7–2.4)

## 2020-05-04 LAB — PHOSPHORUS: Phosphorus: 2.7 mg/dL (ref 2.5–4.6)

## 2020-05-04 MED ORDER — POTASSIUM CHLORIDE CRYS ER 20 MEQ PO TBCR
40.0000 meq | EXTENDED_RELEASE_TABLET | Freq: Once | ORAL | Status: AC
  Administered 2020-05-04: 40 meq via ORAL
  Filled 2020-05-04: qty 2

## 2020-05-04 MED ORDER — INSULIN ASPART 100 UNIT/ML ~~LOC~~ SOLN
5.0000 [IU] | Freq: Three times a day (TID) | SUBCUTANEOUS | Status: DC
  Administered 2020-05-04 – 2020-05-08 (×11): 5 [IU] via SUBCUTANEOUS
  Filled 2020-05-04 (×11): qty 1

## 2020-05-04 NOTE — Consult Note (Signed)
Pharmacy Antibiotic Note  Michelle Stark is a 64 y.o. female admitted on 04/29/2020 with Necrotizing fascitis of the perianal/gluteal/perineal( rt ) area- s/p excisional debridement.  Pharmacy has been consulted for vancomycin and pip/tazo dosing. ID following.   Plan: Zosyn 3.375g IV q8h (4 hour infusion).   On linezolid 600 mg BID IV. F/u with need for linezolid no MRSA growing.   Height: 5\' 2"  (157.5 cm) Weight: 103 kg (227 lb 1.2 oz) IBW/kg (Calculated) : 50.1  Temp (24hrs), Avg:98.1 F (36.7 C), Min:97.8 F (36.6 C), Max:98.6 F (37 C)  Recent Labs  Lab 04/29/20 1947 04/29/20 2151 04/29/20 2155 04/30/20 0457 04/30/20 1032 04/30/20 2058 05/01/20 0043 05/01/20 0728 05/01/20 1259 05/01/20 2133 05/02/20 0626 05/03/20 0634 05/04/20 0520  WBC  --   --   --   --   --  16.3*  --  13.8*  --   --  9.4 11.2* 9.9  CREATININE  --    < >  --  1.11*   < > 0.98   < >  --  0.87 0.80 0.65 0.65 0.44  LATICACIDVEN 2.0*  --  2.5* 1.5  --   --   --   --   --   --   --   --   --    < > = values in this interval not displayed.    Estimated Creatinine Clearance: 80 mL/min (by C-G formula based on SCr of 0.44 mg/dL).    Allergies  Allergen Reactions  . Bee Venom Anaphylaxis  . Ciprofloxacin Itching  . Lisinopril     Cough     Antimicrobials this admission: 10/19 pip/tazo >>  10/19 vancomycin >> 10/20 10/19 clindamycin >> 10/20 10/20 linezolid >>   Dose adjustments this admission: None  Microbiology results: 10/19 BCx: NGTD 10/19 UCx: MULTIPLE SPECIES PRESENT, SUGGEST RECOLLECTIONAbnormal 10/20 Wound cx x 3: ABUNDANT ESCHERICHIA COLI; ABUNDANT GROUP B STREP(S.AGALACTIAE)ISOLATED; ABUNDANT BACTEROIDES FRAGILIS;  FEW PREVOTELLA SPECIES  BETA LACTAMASE POSITIVE  10/20 Leg cx FEW GROUP B STREP(S.AGALACTIAE)ISOLATED; FEW YEAST   Thank you for allowing pharmacy to be a part of this patient's care.  11/20, PharmD, BCPS 05/04/2020 9:40 AM

## 2020-05-04 NOTE — Progress Notes (Signed)
PROGRESS NOTE    Michelle Stark  ZOX:[id number] DOB: March 15, 1956 DOA: 04/29/2020 PCP: Patient, No Pcp Per   Chief complaint.  Follow-up with DKA and sacral wound. Brief Narrative:  Michelle Bernardini Newsomeis a 64 y.o.femalewith medical history significant of HTN, obesity. HLD.Presented withN/V/ increased thirst and fatigue. Patient was unaware she is diabetic Her BG per EMS was 404.Upon arriving the hospital, her CO2 level was less than 7, she had severe metabolic acidosis. Insulin drip was started. Patient was also seen by general surgery for decubitus sacral wound, had surgical I&D performed on 10/20. Currently covered with antibiotics with Zosyn and vancomycin.  10/21.Patient is seen by ID, antibiotics switched to Zyvox and Zosyn. 10/22.Patient going back to the OR for ID. Patient anion gap has closed for 24hours, CO2 level now 20, patient has mild non-anion gap metabolic acidosis. Continued sodium bicarb tablet, discontinue insulin drip, start Lantus. 10/24.  Wound culture growing group B streptococcus.   Assessment & Plan:   Active Problems:   Hypertension   HLD (hyperlipidemia)   Obesity   DKA (diabetic ketoacidosis) (HCC)   AKI (acute kidney injury) (HCC)   Dehydration   Metabolic acidosis due to diabetes mellitus (HCC)   Sepsis (HCC)   Abscess and cellulitis of gluteal region   Elevated troponin   Reactive thrombocytosis   Hypophosphatemia  #1.  Uncontrolled type 2 diabetes with diabetic ketoacidosis. DKA resolved. Patient still has significant hyperglycemia, but early morning glucose is better.  I will add scheduled NovoLog 3 times a day in addition to 36 units of Lantus.  Continue sliding scale insulin.  2.  Hypophosphatemia. Foss level normalized.  Recheck level tomorrow.  3.  Hypokalemia. Continue supplement.  #4.  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 Followed by surgery, debrided twice, scheduled to revisit the wound  tomorrow morning. Initial wound culture came back with group B streptococcus. Discussed with Dr. Renold Don from infectious disease, will discontinue Zyvox, continue Zosyn for now.  #5.  Non-STEMI. Stable due to demand ischemia.  6.  Acute kidney injury.  Renal function improved.     DVT prophylaxis: Lovenox Code Status: Full Family Communication: None  .   Status is: Inpatient  Remains inpatient appropriate because:Inpatient level of care appropriate due to severity of illness   Dispo: The patient is from: Home              Anticipated d/c is to: Home              Anticipated d/c date is: 3 days              Patient currently is not medically stable to d/c.        I/O last 3 completed shifts: In: 0  Out: 2425 [Urine:2300; Drains:125] No intake/output data recorded.     Consultants:   GS,  ID  Procedures: I&D  Antimicrobials: Zosyn.  Subjective: Patient doing well today.  Does not complain any pain in the sacrum. No short of breath or cough. No abdominal pain or nausea vomiting.  Appetite still poor. No fever or chills. No dysuria hematuria.  Objective: Vitals:   05/03/20 1719 05/03/20 1927 05/03/20 2340 05/04/20 0414  BP: (!) 144/60 (!) 132/57 119/63 124/63  Pulse: 76 74 77 68  Resp: Temp: 97.8 F (36.6 C) 98 F (36.7 C) 98.5 F (36.9 C) 98.6 F (37 C)  TempSrc: Oral Oral  Oral  SpO2: 98% 97% 98% 97%  Weight:  Height:        Intake/Output Summary (Last 24 hours) at 05/04/2020 1039 Last data filed at 05/04/2020 0603 Gross per 24 hour  Intake --  Output 1450 ml  Net -1450 ml   Filed Weights   04/29/20 1418 04/30/20 0828  Weight: 82.6 kg 103 kg    Examination:  General exam: Appears calm and comfortable  Respiratory system: Clear to auscultation. Respiratory effort normal. Cardiovascular system: S1 & S2 heard, RRR. No JVD, murmurs, rubs, gallops or clicks. No pedal edema. Gastrointestinal system: Abdomen is nondistended,  soft and nontender. No organomegaly or masses felt. Normal bowel sounds heard. Central nervous system: Alert and oriented. No focal neurological deficits. Extremities: Symmetric 5 x 5 power. Skin: Sacrum wound. Psychiatry:  Mood & affect appropriate.     Data Reviewed: I have personally reviewed following labs and imaging studies  CBC: Recent Labs  Lab 04/29/20 1411 04/29/20 1411 04/30/20 2058 05/01/20 0728 05/02/20 0626 05/03/20 0634 05/04/20 0520  WBC 35.6*  35.3*   < > 16.3* 13.8* 9.4 11.2* 9.9  NEUTROABS 31.1*  --   --  11.5* 6.9 8.3* 6.6  HGB 16.0*  16.0*   < > 12.7 11.6* 11.0* 11.1* 11.0*  HCT 47.9*  47.2*   < > 36.6 32.6* 30.9* 32.6* 32.6*  MCV 93.7  92.0   < > 90.4 88.1 87.5 91.6 92.4  PLT 608*  594*   < > 318 284 260 251 243   < > = values in this interval not displayed.   Basic Metabolic Panel: Recent Labs  Lab 04/29/20 1725 04/29/20 2151 05/01/20 0043 05/01/20 0728 05/01/20 1259 05/01/20 2133 05/02/20 0626 05/03/20 0634 05/04/20 0520  NA 134*   < >   < >  --  140 142 143 142 140  K 3.4*   < >   < >  --  3.5 3.4* 3.6 3.8 3.4*  CL 106   < >   < >  --  114* 116* 116* 110 106  CO2 <7*   < >   < >  --  15* 17* 20* 22 24  GLUCOSE 527*   < >   < >  --  186* 199* 180* 277* 159*  BUN 43*   < >   < >  --  31* 25* CREATININE 1.37*   < >   < >  --  0.87 0.80 0.65 0.65 0.44  CALCIUM 8.1*   < >   < >  --  8.3* 8.3* 8.1* 8.3* 7.9*  MG 2.2  --   --  1.8  --   --  1.8 1.9 1.8  PHOS 4.4  --   --   --   --   --  <1.0* 1.6* 2.7   < > = values in this interval not displayed.   GFR: Estimated Creatinine Clearance: 80 mL/min (by C-G formula based on SCr of 0.44 mg/dL). Liver Function Tests: Recent Labs  Lab 04/30/20 1246  AST 41  ALT 27  ALKPHOS 96  BILITOT 0.6  PROT 6.2*  ALBUMIN 2.4*   Recent Labs  Lab 04/29/20 1725  LIPASE 90*   No results for input(s): AMMONIA in the last 168 hours. Coagulation Profile: Recent Labs  Lab 04/29/20 2155    INR 1.3*   Cardiac Enzymes: Recent Labs  Lab 04/29/20 1725  CKTOTAL 219   BNP (last 3 results) No results for input(s): PROBNP in the last 8760  hours. HbA1C: No results for input(s): HGBA1C in the last 72 hours. CBG: Recent Labs  Lab 05/03/20 0740 05/03/20 1152 05/03/20 1719 05/03/20 2100 05/04/20 0728  GLUCAP 244* 324* 245* 176* 137*   Lipid Profile: No results for input(s): CHOL, HDL, LDLCALC, TRIG, CHOLHDL, LDLDIRECT in the last 72 hours. Thyroid Function Tests: No results for input(s): TSH, T4TOTAL, FREET4, T3FREE, THYROIDAB in the last 72 hours. Anemia Panel: No results for input(s): VITAMINB12, FOLATE, FERRITIN, TIBC, IRON, RETICCTPCT in the last 72 hours. Sepsis Labs: Recent Labs  Lab 04/29/20 1725 04/29/20 1947 04/29/20 2155 04/30/20 0457  PROCALCITON 1.22  --   --   --   LATICACIDVEN  --  2.0* 2.5* 1.5    Recent Results (from the past 240 hour(s))  Respiratory Panel by RT PCR (Flu A&B, Covid) - Nasopharyngeal Swab     Status: None   Collection Time: 04/29/20  3:05 PM   Specimen: Nasopharyngeal Swab  Result Value Ref Range Status   SARS Coronavirus 2 by RT PCR NEGATIVE NEGATIVE Final    Comment: (NOTE) SARS-CoV-2 target nucleic acids are NOT DETECTED.  The SARS-CoV-2 RNA is generally detectable in upper respiratoy specimens during the acute phase of infection. The lowest concentration of SARS-CoV-2 viral copies this assay can detect is 131 copies/mL. A negative result does not preclude SARS-Cov-2 infection and should not be used as the sole basis for treatment or other patient management decisions. A negative result may occur with  improper specimen collection/handling, submission of specimen other than nasopharyngeal swab, presence of viral mutation(s) within the areas targeted by this assay, and inadequate number of viral copies (<131 copies/mL). A negative result must be combined with clinical observations, patient history, and epidemiological  information. The expected result is Negative.  Fact Sheet for Patients:  https://www.moore.com/  Fact Sheet for Healthcare Providers:  https://www.young.biz/  This test is no t yet approved or cleared by the [place] FDA and  has been authorized for detection and/or diagnosis of SARS-CoV-2 by FDA under an Emergency Use Authorization (EUA). This EUA will remain  in effect (meaning this test can be used) for the duration of the COVID-19 declaration under Section 564(b)(1) of the Act, 21 U.S.C. section 360bbb-3(b)(1), unless the authorization is terminated or revoked sooner.     Influenza A by PCR NEGATIVE NEGATIVE Final   Influenza B by PCR NEGATIVE NEGATIVE Final    Comment: (NOTE) The Xpert Xpress SARS-CoV-2/FLU/RSV assay is intended as an aid in  the diagnosis of influenza from Nasopharyngeal swab specimens and  should not be used as a sole basis for treatment. Nasal washings and  aspirates are unacceptable for Xpert Xpress SARS-CoV-2/FLU/RSV  testing.  Fact Sheet for Patients: https://www.moore.com/  Fact Sheet for Healthcare Providers: https://www.young.biz/  This test is not yet approved or cleared by the [place] FDA and  has been authorized for detection and/or diagnosis of SARS-CoV-2 by  FDA under an Emergency Use Authorization (EUA). This EUA will remain  in effect (meaning this test can be used) for the duration of the  Covid-19 declaration under Section 564(b)(1) of the Act, 21  U.S.C. section 360bbb-3(b)(1), unless the authorization is  terminated or revoked. Performed at [facility], [address] [place], [place] [address]   Blood culture (routine x 2)     Status: None   Collection Time: 04/29/20  3:39 PM   Specimen: BLOOD  Result Value Ref Range Status   Specimen Description BLOOD BLOOD LEFT FOREARM  Final  Special Requests   Final    BOTTLES DRAWN AEROBIC  AND ANAEROBIC Blood Culture adequate volume   Culture   Final    NO GROWTH 5 DAYS Performed at [facility], [address] [place], [place] [address]    Report Status 05/04/2020 FINAL  Final  Blood culture (routine x 2)     Status: None   Collection Time: 04/29/20  3:44 PM   Specimen: BLOOD  Result Value Ref Range Status   Specimen Description BLOOD BLOOD RIGHT FOREARM  Final   Special Requests   Final    BOTTLES DRAWN AEROBIC AND ANAEROBIC Blood Culture adequate volume   Culture   Final    NO GROWTH 5 DAYS Performed at [facility], [address], [place], [place] [address]    Report Status 05/04/2020 FINAL  Final  Urine culture     Status: Abnormal   Collection Time: 04/29/20  9:00 PM   Specimen: Urine, Catheterized  Result Value Ref Range Status   Specimen Description   Final    URINE, CATHETERIZED Performed at [facility], [address], [place], [place] [address]    Special Requests   Final    NONE Performed at [facility], [address] [place], [place] [address]    Culture MULTIPLE SPECIES PRESENT, SUGGEST RECOLLECTION (A)  Final   Report Status 05/01/2020 FINAL  Final  Aerobic/Anaerobic Culture (surgical/deep wound)     Status: None   Collection Time: 04/30/20  2:35 AM   Specimen: PATH Other; Wound  Result Value Ref Range Status   Specimen Description   Final    PERIRECTAL ABSCESS Performed at [facility], [address] [place], [place] [address]    Special Requests   Final    NONE Performed at [facility], [address] [place], [place] [address]    Gram Stain   Final    ABUNDANT WBC PRESENT, PREDOMINANTLY PMN ABUNDANT GRAM POSITIVE COCCI ABUNDANT GRAM NEGATIVE RODS ABUNDANT GRAM NEGATIVE COCCI    Culture   Final    ABUNDANT ESCHERICHIA COLI ABUNDANT GROUP B STREP(S.AGALACTIAE)ISOLATED TESTING AGAINST S. AGALACTIAE NOT ROUTINELY PERFORMED DUE TO PREDICTABILITY OF AMP/PEN/VAN  SUSCEPTIBILITY. ABUNDANT BACTEROIDES FRAGILIS MODERATE PREVOTELLA SPECIES BETA LACTAMASE POSITIVE Performed at [facility], [address], [place], [place] [address]    Report Status 05/03/2020 FINAL  Final   Organism ID, Bacteria ESCHERICHIA COLI  Final      Susceptibility   Escherichia coli - MIC*    AMPICILLIN >=32 RESISTANT Resistant     CEFAZOLIN <=4 SENSITIVE Sensitive     CEFEPIME <=0.12 SENSITIVE Sensitive     CEFTAZIDIME <=1 SENSITIVE Sensitive     CEFTRIAXONE <=0.25 SENSITIVE Sensitive     CIPROFLOXACIN 0.5 SENSITIVE Sensitive     GENTAMICIN <=1 SENSITIVE Sensitive     IMIPENEM 0.5 SENSITIVE Sensitive     TRIMETH/SULFA <=20 SENSITIVE Sensitive     AMPICILLIN/SULBACTAM 16 INTERMEDIATE Intermediate     PIP/TAZO <=4 SENSITIVE Sensitive     * ABUNDANT ESCHERICHIA COLI  Aerobic/Anaerobic Culture (surgical/deep wound)     Status: None   Collection Time: 04/30/20  2:35 AM   Specimen: PATH Other; Wound  Result Value Ref Range Status   Specimen Description   Final    PERIRECTAL ABSCESS Performed at [facility], [address] [place], [place] [address]    Special Requests   Final    NONE Performed at [facility], [address] [place], [place] [address]    Gram Stain   Final    MODERATE WBC PRESENT, PREDOMINANTLY PMN ABUNDANT GRAM NEGATIVE RODS ABUNDANT GRAM POSITIVE COCCI ABUNDANT GRAM NEGATIVE COCCI    Culture   Final    ABUNDANT ESCHERICHIA COLI ABUNDANT GROUP B STREP(S.AGALACTIAE)ISOLATED TESTING AGAINST S. AGALACTIAE NOT ROUTINELY PERFORMED DUE TO PREDICTABILITY OF AMP/PEN/VAN SUSCEPTIBILITY. ABUNDANT BACTEROIDES FRAGILIS FEW PREVOTELLA SPECIES BETA LACTAMASE POSITIVE Performed at [facility], [address] [place], [place] [address]    Report Status 05/03/2020 FINAL  Final   Organism ID, Bacteria ESCHERICHIA COLI  Final      Susceptibility   Escherichia coli - MIC*    AMPICILLIN >=32 RESISTANT Resistant      CEFAZOLIN <=4 SENSITIVE Sensitive     CEFEPIME <=0.12 SENSITIVE Sensitive     CEFTAZIDIME <=1 SENSITIVE Sensitive     CEFTRIAXONE <=0.25 SENSITIVE Sensitive     CIPROFLOXACIN <=0.25 SENSITIVE Sensitive     GENTAMICIN <=1 SENSITIVE Sensitive     IMIPENEM <=0.25 SENSITIVE Sensitive     TRIMETH/SULFA <=20 SENSITIVE Sensitive     AMPICILLIN/SULBACTAM 16 INTERMEDIATE Intermediate     PIP/TAZO <=4 SENSITIVE Sensitive     * ABUNDANT ESCHERICHIA COLI  Aerobic/Anaerobic Culture (surgical/deep wound)     Status: None   Collection Time: 04/30/20  2:35 AM   Specimen: PATH Other; Tissue  Result Value Ref Range Status   Specimen Description   Final    PERIRECTAL ABSCESS Performed at [facility], [address] [place], [place] [address]    Special Requests   Final    NONE Performed at [facility], [address] [place], [place] [address]    Gram Stain   Final    FEW WBC PRESENT, PREDOMINANTLY PMN ABUNDANT GRAM POSITIVE COCCI ABUNDANT GRAM NEGATIVE RODS    Culture   Final    FEW ESCHERICHIA COLI ABUNDANT GROUP B STREP(S.AGALACTIAE)ISOLATED TESTING AGAINST S. AGALACTIAE NOT ROUTINELY PERFORMED DUE TO PREDICTABILITY OF AMP/PEN/VAN SUSCEPTIBILITY. ABUNDANT BACTEROIDES FRAGILIS MODERATE PREVOTELLA SPECIES BETA LACTAMASE POSITIVE Performed at [facility], [address] [place], [place] [address]    Report Status 05/03/2020 FINAL  Final   Organism ID, Bacteria ESCHERICHIA COLI  Final      Susceptibility   Escherichia coli - MIC*    AMPICILLIN >=32 RESISTANT Resistant     CEFAZOLIN <=4 SENSITIVE Sensitive     CEFEPIME <=0.12 SENSITIVE Sensitive     CEFTAZIDIME <=1 SENSITIVE Sensitive     CEFTRIAXONE <=0.25 SENSITIVE Sensitive     CIPROFLOXACIN <=0.25 SENSITIVE Sensitive     GENTAMICIN <=1 SENSITIVE Sensitive     IMIPENEM 0.5 SENSITIVE Sensitive     TRIMETH/SULFA <=20 SENSITIVE Sensitive     AMPICILLIN/SULBACTAM 16 INTERMEDIATE Intermediate     PIP/TAZO  <=4 SENSITIVE Sensitive     * FEW ESCHERICHIA COLI  MRSA PCR Screening     Status: None   Collection Time: 04/30/20  8:27 AM   Specimen: Nasal Mucosa; Nasopharyngeal  Result Value Ref Range Status   MRSA by PCR NEGATIVE NEGATIVE Final    Comment:        The GeneXpert MRSA Assay (FDA approved for NASAL specimens only), is one component of a comprehensive MRSA colonization surveillance program. It is not intended to diagnose MRSA infection nor to guide or monitor treatment for MRSA infections. Performed at [facility], [address], [place], [place] [address]   Aerobic Culture (superficial specimen)  Status: None (Preliminary result)   Collection Time: 04/30/20  9:44 PM   Specimen: Leg  Result Value Ref Range Status   Specimen Description   Final    LEG Performed at [facility], [address] [place], [place] [address]    Special Requests   Final    NONE Performed at [facility], [address] [place], [place] [address]    Gram Stain   Final    NO WBC SEEN MODERATE GRAM POSITIVE COCCI RARE YEAST    Culture   Final    FEW GROUP B STREP(S.AGALACTIAE)ISOLATED TESTING AGAINST S. AGALACTIAE NOT ROUTINELY PERFORMED DUE TO PREDICTABILITY OF AMP/PEN/VAN SUSCEPTIBILITY. FEW YEAST IDENTIFICATION TO FOLLOW Performed at [facility], [address] [place], [place] [address]    Report Status PENDING  Incomplete         Radiology Studies: No results found.      Scheduled Meds: . aspirin EC  81 mg Oral Daily  . atorvastatin  20 mg Oral Daily  . Chlorhexidine Gluconate Cloth  6 each Topical Daily  . enoxaparin (LOVENOX) injection  0.5 mg/kg Subcutaneous Q24H  . famotidine  20 mg Oral Daily  . insulin aspart  0-9 Units Subcutaneous TID AC & HS  . insulin aspart  5 Units Subcutaneous TID WC  . insulin glargine  36 Units Subcutaneous Daily  . metoprolol succinate  12.5 mg Oral Daily  . multivitamin with minerals  1  tablet Oral Daily  . potassium chloride  40 mEq Oral Once  . Ensure Max Protein  11 oz Oral BID BM  . sodium hypochlorite   Irrigation Once   Continuous Infusions: . albumin human Stopped (04/30/20 0520)  . linezolid (ZYVOX) IV 600 mg (05/04/20 0914)  . piperacillin-tazobactam (ZOSYN)  IV 3.375 g (05/04/20 0506)     LOS: 4 days    Time spent: 28 minutes    Marrion Coyekui Cadell Gabrielson, MD [facility]   To contact the attending provider between 7A-7P or the covering provider during after hours 7P-7A, please log into the web site [email or web address] and access using universal [place] password for that web site. If you do not have the password, please call the hospital operator.  05/04/2020, 10:39 AM

## 2020-05-04 NOTE — Progress Notes (Signed)
This is a patient of Dr. Hurman Horn, seen on rounds today.  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  Michelle Stark underwent an initial debridement with Dr. Everlene Farrier on the day of presentation at around 3 AM.  Michelle Stark was taken to the OR Friday for a second look.  Additional debridement was performed and a wound VAC was placed.  125 cc drain output recorded.  Michelle Stark is tolerating a diet without difficulty.  Michelle Stark is scheduled to return to the OR tomorrow for examination under anesthesia and a wound VAC dressing change.  Today's Vitals   05/03/20 2011 05/03/20 2340 05/04/20 0414 05/04/20 1103  BP:  119/63 124/63   Pulse:  77 68   Resp:  19 20   Temp:  98.5 F (36.9 C) 98.6 F (37 C)   TempSrc:   Oral   SpO2:  98% 97%   Weight:      Height:      PainSc: 0-No pain   0-No pain   Body mass index is 41.53 kg/m. I/O last 3 completed shifts: In: 0  Out: 2425 [Urine:2300; Drains:125] No intake/output data recorded.  General: Michelle Stark is alert and oriented and in no acute distress Wound: The wound VAC is adherent without leaks.  There is serosanguineous fluid in the canister.  Results for QUINTA, EIMER (MRN [id number]) as of 05/04/2020 11:23  Ref. Range 05/04/2020 05:20  Sodium Latest Ref Range: 135 - 145 mmol/L 140  Potassium Latest Ref Range: 3.5 - 5.1 mmol/L 3.4 (L)  Chloride Latest Ref Range: 98 - 111 mmol/L 106  CO2 Latest Ref Range: 22 - 32 mmol/L 24  Glucose Latest Ref Range: 70 - 99 mg/dL 287 (H)  BUN Latest Ref Range: 8 - 23 mg/dL 13  Creatinine Latest Ref Range: 0.44 - 1.00 mg/dL 6.81  Calcium Latest Ref Range: 8.9 - 10.3 mg/dL 7.9 (L)  Anion gap Latest Ref Range: 5 - 15  10  Phosphorus Latest Ref Range: 2.5 - 4.6 mg/dL 2.7  Magnesium Latest Ref Range: 1.7 - 2.4 mg/dL 1.8  GFR, Estimated Latest Ref Range: >60 mL/min >60  WBC Latest Ref Range: 4.0 - 10.5 K/uL 9.9  RBC Latest Ref Range:  3.87 - 5.11 MIL/uL 3.53 (L)  Hemoglobin Latest Ref Range: 12.0 - 15.0 g/dL 15.7 (L)  HCT Latest Ref Range: 36 - 46 % 32.6 (L)  MCV Latest Ref Range: 80.0 - 100.0 fL 92.4  MCH Latest Ref Range: 26.0 - 34.0 pg 31.2  MCHC Latest Ref Range: 30.0 - 36.0 g/dL 26.2  RDW Latest Ref Range: 11.5 - 15.5 % 13.7  Platelets Latest Ref Range: 150 - 400 K/uL 243  nRBC Latest Ref Range: 0.0 - 0.2 % 0.4 (H)    Impression and plan: This is a 64 year old woman who presented in DKA and was found to have necrotizing fasciitis.  Michelle Stark is undergone 2 debridements.  Michelle Stark is currently on broad-spectrum antibiotic coverage.  Sensitivities from OR cultures are pending.  Dr. Everlene Farrier plans to take her to the operating room on Monday for an examination under anesthesia, possible additional debridement, and wound VAC change.  Michelle Stark should be made n.p.o. after midnight for that procedure.

## 2020-05-05 ENCOUNTER — Encounter
Admission: EM | Disposition: A | Discharge status: Home Health | Payer: Self-pay | Source: Home / Self Care | Attending: Internal Medicine

## 2020-05-05 ENCOUNTER — Encounter: Payer: Self-pay | Admitting: Surgery

## 2020-05-05 ENCOUNTER — Inpatient Hospital Stay: Payer: No Typology Code available for payment source | Admitting: Anesthesiology

## 2020-05-05 DIAGNOSIS — L0231 Cutaneous abscess of buttock: Secondary | ICD-10-CM | POA: Diagnosis not present

## 2020-05-05 DIAGNOSIS — L03317 Cellulitis of buttock: Secondary | ICD-10-CM | POA: Diagnosis not present

## 2020-05-05 DIAGNOSIS — M728 Other fibroblastic disorders: Secondary | ICD-10-CM | POA: Diagnosis not present

## 2020-05-05 DIAGNOSIS — B9620 Unspecified Escherichia coli [E. coli] as the cause of diseases classified elsewhere: Secondary | ICD-10-CM | POA: Diagnosis not present

## 2020-05-05 HISTORY — PX: INCISION AND DRAINAGE ABSCESS: SHX5864

## 2020-05-05 LAB — BASIC METABOLIC PANEL
Anion gap: 9 (ref 5–15)
BUN: 11 mg/dL (ref 8–23)
CO2: 26 mmol/L (ref 22–32)
Calcium: 7.8 mg/dL — ABNORMAL LOW (ref 8.9–10.3)
Chloride: 105 mmol/L (ref 98–111)
Creatinine, Ser: 0.51 mg/dL (ref 0.44–1.00)
GFR, Estimated: >60 mL/min (ref >60)
Glucose, Bld: 145 mg/dL — ABNORMAL HIGH (ref 70–99)
Potassium: 3.8 mmol/L (ref 3.5–5.1)
Sodium: 140 mmol/L (ref 135–145)

## 2020-05-05 LAB — AEROBIC CULTURE W GRAM STAIN (SUPERFICIAL SPECIMEN): Gram Stain: NONE SEEN

## 2020-05-05 LAB — CBC WITH DIFFERENTIAL/PLATELET
Abs Immature Granulocytes: 0.17 10*3/uL — ABNORMAL HIGH (ref 0.00–0.07)
Basophils Absolute: 0 10*3/uL (ref 0.0–0.1)
Basophils Relative: 0 %
Eosinophils Absolute: 0.2 10*3/uL (ref 0.0–0.5)
Eosinophils Relative: 2 %
HCT: 33.7 % — ABNORMAL LOW (ref 36.0–46.0)
Hemoglobin: 11.2 g/dL — ABNORMAL LOW (ref 12.0–15.0)
Immature Granulocytes: 2 %
Lymphocytes Relative: 26 %
Lymphs Abs: 2.6 10*3/uL (ref 0.7–4.0)
MCH: 31 pg (ref 26.0–34.0)
MCHC: 33.2 g/dL (ref 30.0–36.0)
MCV: 93.4 fL (ref 80.0–100.0)
Monocytes Absolute: 0.8 10*3/uL (ref 0.1–1.0)
Monocytes Relative: 8 %
Neutro Abs: 6.2 10*3/uL (ref 1.7–7.7)
Neutrophils Relative %: 62 %
Platelets: 267 10*3/uL (ref 150–400)
RBC: 3.61 MIL/uL — ABNORMAL LOW (ref 3.87–5.11)
RDW: 13.2 % (ref 11.5–15.5)
WBC: 10 10*3/uL (ref 4.0–10.5)
nRBC: 0.2 % (ref 0.0–0.2)

## 2020-05-05 LAB — GLUCOSE, CAPILLARY
Glucose-Capillary: 138 mg/dL — ABNORMAL HIGH (ref 70–99)
Glucose-Capillary: 117 mg/dL — ABNORMAL HIGH (ref 70–99)
Glucose-Capillary: 206 mg/dL — ABNORMAL HIGH (ref 70–99)
Glucose-Capillary: 273 mg/dL — ABNORMAL HIGH (ref 70–99)

## 2020-05-05 LAB — SURGICAL PATHOLOGY

## 2020-05-05 LAB — PHOSPHORUS: Phosphorus: 2.9 mg/dL (ref 2.5–4.6)

## 2020-05-05 LAB — MAGNESIUM: Magnesium: 1.8 mg/dL (ref 1.7–2.4)

## 2020-05-05 SURGERY — INCISION AND DRAINAGE, ABSCESS
Anesthesia: General

## 2020-05-05 MED ORDER — ACETAMINOPHEN 10 MG/ML IV SOLN
INTRAVENOUS | Status: DC | PRN
  Administered 2020-05-05: 1000 mg via INTRAVENOUS

## 2020-05-05 MED ORDER — SODIUM CHLORIDE 0.9 % IV SOLN: INTRAVENOUS | Status: DC | PRN

## 2020-05-05 MED ORDER — ACETAMINOPHEN 10 MG/ML IV SOLN
INTRAVENOUS | Status: AC
  Filled 2020-05-05: qty 100

## 2020-05-05 MED ORDER — LIDOCAINE HCL (CARDIAC) PF 100 MG/5ML IV SOSY
PREFILLED_SYRINGE | INTRAVENOUS | Status: DC | PRN
  Administered 2020-05-05: 80 mg via INTRAVENOUS

## 2020-05-05 MED ORDER — DEXMEDETOMIDINE (PRECEDEX) IN NS 20 MCG/5ML (4 MCG/ML) IV SYRINGE
PREFILLED_SYRINGE | INTRAVENOUS | Status: DC | PRN
  Administered 2020-05-05: 8 ug via INTRAVENOUS

## 2020-05-05 MED ORDER — DEXAMETHASONE SODIUM PHOSPHATE 10 MG/ML IJ SOLN
INTRAMUSCULAR | Status: DC | PRN
  Administered 2020-05-05: 10 mg via INTRAVENOUS

## 2020-05-05 MED ORDER — FENTANYL CITRATE (PF) 100 MCG/2ML IJ SOLN: 25.0000 ug | INTRAMUSCULAR | Status: DC | PRN

## 2020-05-05 MED ORDER — SUGAMMADEX SODIUM 500 MG/5ML IV SOLN
INTRAVENOUS | Status: DC | PRN
  Administered 2020-05-05: 200 mg via INTRAVENOUS

## 2020-05-05 MED ORDER — SUCCINYLCHOLINE CHLORIDE 20 MG/ML IJ SOLN
INTRAMUSCULAR | Status: DC | PRN
  Administered 2020-05-05: 120 mg via INTRAVENOUS

## 2020-05-05 MED ORDER — PHENYLEPHRINE HCL (PRESSORS) 10 MG/ML IV SOLN
INTRAVENOUS | Status: DC | PRN
  Administered 2020-05-05 (×6): 100 ug via INTRAVENOUS

## 2020-05-05 MED ORDER — PROPOFOL 10 MG/ML IV BOLUS
INTRAVENOUS | Status: AC
  Filled 2020-05-05: qty 20

## 2020-05-05 MED ORDER — FENTANYL CITRATE (PF) 100 MCG/2ML IJ SOLN
INTRAMUSCULAR | Status: AC
  Filled 2020-05-05: qty 2

## 2020-05-05 MED ORDER — FENTANYL CITRATE (PF) 100 MCG/2ML IJ SOLN
INTRAMUSCULAR | Status: DC | PRN
  Administered 2020-05-05: 50 ug via INTRAVENOUS

## 2020-05-05 MED ORDER — ROCURONIUM BROMIDE 100 MG/10ML IV SOLN
INTRAVENOUS | Status: DC | PRN
  Administered 2020-05-05: 40 mg via INTRAVENOUS
  Administered 2020-05-05: 10 mg via INTRAVENOUS

## 2020-05-05 MED ORDER — SODIUM CHLORIDE 0.9 % IV SOLN
INTRAVENOUS | Status: DC | PRN
  Administered 2020-05-05: 15 ug/min via INTRAVENOUS

## 2020-05-05 MED ORDER — ONDANSETRON HCL 4 MG/2ML IJ SOLN: 4.0000 mg | Freq: Once | INTRAMUSCULAR | Status: DC | PRN

## 2020-05-05 MED ORDER — EPHEDRINE SULFATE 50 MG/ML IJ SOLN
INTRAMUSCULAR | Status: DC | PRN
  Administered 2020-05-05 (×2): 10 mg via INTRAVENOUS

## 2020-05-05 MED ORDER — ONDANSETRON HCL 4 MG/2ML IJ SOLN
INTRAMUSCULAR | Status: DC | PRN
  Administered 2020-05-05 (×2): 4 mg via INTRAVENOUS

## 2020-05-05 MED ORDER — MIDAZOLAM HCL 2 MG/2ML IJ SOLN
INTRAMUSCULAR | Status: AC
  Filled 2020-05-05: qty 2

## 2020-05-05 MED ORDER — GLYCOPYRROLATE 0.2 MG/ML IJ SOLN
INTRAMUSCULAR | Status: DC | PRN
  Administered 2020-05-05: .2 mg via INTRAVENOUS

## 2020-05-05 MED ORDER — MIDAZOLAM HCL 2 MG/2ML IJ SOLN
INTRAMUSCULAR | Status: DC | PRN
  Administered 2020-05-05 (×2): 1 mg via INTRAVENOUS

## 2020-05-05 MED ORDER — PROPOFOL 10 MG/ML IV BOLUS
INTRAVENOUS | Status: DC | PRN
  Administered 2020-05-05: 180 mg via INTRAVENOUS

## 2020-05-05 MED ORDER — FLUCONAZOLE 200 MG PO TABS
400.0000 mg | ORAL_TABLET | Freq: Once | ORAL | Status: AC
  Administered 2020-05-05: 400 mg via ORAL
  Filled 2020-05-05: qty 2

## 2020-05-05 MED ORDER — FLUCONAZOLE 100 MG PO TABS
200.0000 mg | ORAL_TABLET | Freq: Every day | ORAL | Status: AC
  Administered 2020-05-06 – 2020-05-10 (×5): 200 mg via ORAL
  Filled 2020-05-05: qty 2
  Filled 2020-05-05: qty 1
  Filled 2020-05-05 (×2): qty 2
  Filled 2020-05-05: qty 1

## 2020-05-05 SURGICAL SUPPLY — 27 items
BLADE SURG 15 STRL LF DISP TIS (BLADE) ×1 IMPLANT
BLADE SURG 15 STRL SS (BLADE) ×3
CANISTER SUCT VAC XL PIRAHNA (CANNISTER) IMPLANT
CANISTER WOUND CARE 500ML ATS (WOUND CARE) ×2 IMPLANT
COVER WAND RF STERILE (DRAPES) ×3 IMPLANT
DRAPE LAPAROTOMY 77X122 PED (DRAPES) ×3 IMPLANT
DRAPE UNDER BUTTOCK W/FLU (DRAPES) ×2 IMPLANT
DRESSING SURGICEL FIBRLLR 1X2 (HEMOSTASIS) IMPLANT
DRSG HYDROCOLLOID 4X4 (GAUZE/BANDAGES/DRESSINGS) ×2 IMPLANT
DRSG SURGICEL FIBRILLAR 1X2 (HEMOSTASIS) ×3
ELECT CAUTERY BLADE 6.4 (BLADE) ×2 IMPLANT
ELECT REM PT RETURN 9FT ADLT (ELECTROSURGICAL) ×3
ELECTRODE REM PT RTRN 9FT ADLT (ELECTROSURGICAL) ×1 IMPLANT
GLOVE BIO SURGEON STRL SZ7 (GLOVE) ×7 IMPLANT
GOWN STRL REUS W/ TWL LRG LVL3 (GOWN DISPOSABLE) ×2 IMPLANT
GOWN STRL REUS W/TWL LRG LVL3 (GOWN DISPOSABLE) ×6
HEMOSTAT SURGICEL 2X3 (HEMOSTASIS) ×2 IMPLANT
KIT DRSG VAC SLVR GRANUFM (MISCELLANEOUS) ×2 IMPLANT
LEGGING LITHOTOMY PAIR STRL (DRAPES) ×2 IMPLANT
NEEDLE HYPO 22GX1.5 SAFETY (NEEDLE) ×3 IMPLANT
NS IRRIG 1000ML POUR BTL (IV SOLUTION) ×3 IMPLANT
PACK BASIN MINOR (MISCELLANEOUS) ×3 IMPLANT
PAD PREP 24X41 OB/GYN DISP (PERSONAL CARE ITEMS) ×2 IMPLANT
SOL PREP PVP 2OZ (MISCELLANEOUS) ×3
SOLUTION PREP PVP 2OZ (MISCELLANEOUS) ×1 IMPLANT
SPONGE LAP 18X18 RF (DISPOSABLE) ×5 IMPLANT
SYR BULB IRRIG 60ML STRL (SYRINGE) ×2 IMPLANT

## 2020-05-05 NOTE — Progress Notes (Signed)
Preoperative Review   Patient is met in the preoperatively. The history is reviewed in the chart and with the patient. I personally reviewed the options and rationale as well as the risks of this procedure that have been previously discussed with the patient. All questions asked by the patient and/or family were answered to their satisfaction.  Patient agrees to proceed with this procedure at this time.  Salam Chesterfield M.D. FACS   

## 2020-05-05 NOTE — Care Management (Signed)
TOC assessment completed.  Full note to follow 

## 2020-05-05 NOTE — Anesthesia Preprocedure Evaluation (Signed)
Anesthesia Evaluation  Patient identified by MRN, date of birth, ID band Patient awake    Reviewed: Allergy & Precautions, H&P , NPO status , Patient's Chart, lab work & pertinent test results, reviewed documented beta blocker date and time   Airway Mallampati: III  TM Distance: <3 FB Neck ROM: full    Dental  (+) Teeth Intact   Pulmonary neg pulmonary ROS, former smoker,    Pulmonary exam normal        Cardiovascular Exercise Tolerance: Poor hypertension, On Medications negative cardio ROS Normal cardiovascular exam Rhythm:regular Rate:Normal     Neuro/Psych negative neurological ROS  negative psych ROS   GI/Hepatic negative GI ROS, Neg liver ROS,   Endo/Other  diabetes, Poorly Controlled, Type 1, Insulin DependentMorbid obesity  Renal/GU Renal disease  negative genitourinary   Musculoskeletal   Abdominal   Peds  Hematology negative hematology ROS (+)   Anesthesia Other Findings Past Medical History: No date: Hyperlipidemia No date: Hypertension No date: Obesity Past Surgical History: No date: CHOLECYSTECTOMY 04/30/2020: INCISION AND DRAINAGE ABSCESS; N/A     Comment:  Procedure: INCISION AND DRAINAGE PERIRECTAL  ABSCESS;                Surgeon: Leafy Ro, MD;  Location: [facility] ORS;                Service: General;  Laterality: N/A; 05/02/2020: INCISION AND DRAINAGE ABSCESS; N/A     Comment:  Procedure: INCISION AND DRAINAGE ABSCESS;  Surgeon:               Leafy Ro, MD;  Location: [facility] ORS;  Service:               General;  Laterality: N/A; No date: RHINOPLASTY BMI    Body Mass Index: 41.53 kg/m     Reproductive/Obstetrics negative OB ROS                             Anesthesia Physical Anesthesia Plan  ASA: II  Anesthesia Plan: General ETT   Post-op Pain Management:    Induction:   PONV Risk Score and Plan:   Airway Management Planned:   Additional  Equipment:   Intra-op Plan:   Post-operative Plan:   Informed Consent: I have reviewed the patients History and Physical, chart, labs and discussed the procedure including the risks, benefits and alternatives for the proposed anesthesia with the patient or authorized representative who has indicated his/her understanding and acceptance.     Dental Advisory Given  Plan Discussed with: CRNA  Anesthesia Plan Comments:         Anesthesia Quick Evaluation

## 2020-05-05 NOTE — Progress Notes (Signed)
PROGRESS NOTE    Michelle Stark  ZOX:[id number] DOB: [birth year] DOA: 04/29/2020 PCP: Patient, No Pcp Per   Follow-up with DKA and sacral wound. Brief Narrative:   Michelle Kopke Newsomeis a 64 y.o.femalewith medical history significant of HTN, obesity. HLD.Presented withN/V/ increased thirst and fatigue. Patient was unaware she is diabetic Her BG per EMS was 404.Upon arriving the hospital, her CO2 level was less than 7, she had severe metabolic acidosis. Insulin drip was started. Patient was also seen by general surgery for decubitus sacral wound, had surgical I&D performed on 10/20. Currently covered with antibiotics with Zosyn and vancomycin.  10/21.Patient is seen by ID, antibiotics switched to Zyvox and Zosyn. 10/22.Patient going back to the OR for ID. Patient anion gap has closed for 24hours, CO2 level now 20, patient has mild non-anion gap metabolic acidosis. Continued sodium bicarb tablet, discontinue insulin drip, start Lantus. 10/24.  Wound culture growing group B streptococcus. Antibiotics with Zosyn after discussion with infect disease. 10/25. Patient is going for the third debridement today.  Assessment & Plan:   Active Problems:   Hypertension   HLD (hyperlipidemia)   Obesity   DKA (diabetic ketoacidosis) (HCC)   AKI (acute kidney injury) (HCC)   Dehydration   Metabolic acidosis due to diabetes mellitus (HCC)   Sepsis (HCC)   Abscess and cellulitis of gluteal region   Elevated troponin   Reactive thrombocytosis   Hypophosphatemia  #1. Uncontrolled type 2 diabetes with hyperglycemia, DKA. DKA resolved. Glucose is also better on current scheduled insulin and a sliding scale insulin.  2. Severe hypophosphatemia. Patient phosphorus level continue to be normal today.  3. Sepsis with necrotizing soft tissue infection secondary to group B streptococcus. Patient will be debrided again today. Continue Zosyn. Appreciate ID consult.  4.  Non-STEMI. Secondary to demand ischemia.  Please ambulation with physical therapy.     DVT prophylaxis: Lovenox Code Status: Full Family Communication: None  .   Status is: Inpatient  Remains inpatient appropriate because:Inpatient level of care appropriate due to severity of illness   Dispo: The patient is from: Home              Anticipated d/c is to:Uncertain              Anticipated d/c date is: > 3 days              Patient currently is not medically stable to d/c.        I/O last 3 completed shifts: In: 559.6 [IV Piggyback:559.6] Out: 1400 [Urine:1400] No intake/output data recorded.     Consultants:   ID and general surgery  Procedures: Sacrum debridement  Antimicrobials: Zosyn  Subjective: Patient doing well today. She is n.p.o. pending surgery. Denies any abdominal pain nausea vomiting. No diarrhea. No short of breath or cough. No fever chills. No dysuria hematuria.  Objective: Vitals:   05/04/20 1649 05/04/20 1941 05/04/20 2308 05/05/20 0418  BP: 133/66 125/64 139/70 135/69  Pulse: 76 75 64 60  Resp: 16 16 20 16   Temp: 98 F (36.7 C) 98.7 F (37.1 C) 98.8 F (37.1 C) 98.4 F (36.9 C)  TempSrc: Oral Oral Oral   SpO2: 97% 97% 96% 96%  Weight:      Height:        Intake/Output Summary (Last 24 hours) at 05/05/2020 1038 Last data filed at 05/05/2020 0631 Gross per 24 hour  Intake 559.56 ml  Output 800 ml  Net -240.44 ml   05/07/2020  04/29/20 1418 04/30/20 0828  Weight: 82.6 kg 103 kg    Examination:  General exam: Appears calm and comfortable  Respiratory system: Clear to auscultation. Respiratory effort normal. Cardiovascular system: S1 & S2 heard, RRR. No JVD, murmurs, rubs, gallops or clicks. No pedal edema. Gastrointestinal system: Abdomen is nondistended, soft and nontender. No organomegaly or masses felt. Normal bowel sounds heard. Central nervous system: Alert and oriented. No focal neurological  deficits. Extremities: Symmetric 5 x 5 power. Skin: No rashes, lesions or ulcers Psychiatry:  Mood & affect appropriate.     Data Reviewed: I have personally reviewed following labs and imaging studies  CBC: Recent Labs  Lab 05/01/20 0728 05/02/20 0626 05/03/20 0634 05/04/20 0520 05/05/20 0431  WBC 13.8* 9.4 11.2* 9.9 10.0  NEUTROABS 11.5* 6.9 8.3* 6.6 6.2  HGB 11.6* 11.0* 11.1* 11.0* 11.2*  HCT 32.6* 30.9* 32.6* 32.6* 33.7*  MCV 88.1 87.5 91.6 92.4 93.4  PLT 284 260 251 243 267   Basic Metabolic Panel: Recent Labs  Lab 04/29/20 1725 04/29/20 2151 05/01/20 0728 05/01/20 1259 05/01/20 2133 05/02/20 0626 05/03/20 0634 05/04/20 0520 05/05/20 0431  NA 134*   < >  --    < > 142 143 142 140 140  K 3.4*   < >  --    < > 3.4* 3.6 3.8 3.4* 3.8  CL 106   < >  --    < > 116* 116* 110 106 105  CO2 <7*   < >  --    < > 17* 20* 22 24 26   GLUCOSE 527*   < >  --    < > 199* 180* 277* 159* 145*  BUN 43*   < >  --    < > 25* 21 18 13 11   CREATININE 1.37*   < >  --    < > 0.80 0.65 0.65 0.44 0.51  CALCIUM 8.1*   < >  --    < > 8.3* 8.1* 8.3* 7.9* 7.8*  MG 2.2  --  1.8  --   --  1.8 1.9 1.8 1.8  PHOS 4.4  --   --   --   --  <1.0* 1.6* 2.7 2.9   < > = values in this interval not displayed.   GFR: Estimated Creatinine Clearance: 80 mL/min (by C-G formula based on SCr of 0.51 mg/dL). Liver Function Tests: Recent Labs  Lab 04/30/20 1246  AST 41  ALT 27  ALKPHOS 96  BILITOT 0.6  PROT 6.2*  ALBUMIN 2.4*   Recent Labs  Lab 04/29/20 1725  LIPASE 90*   No results for input(s): AMMONIA in the last 168 hours. Coagulation Profile: Recent Labs  Lab 04/29/20 2155  INR 1.3*   Cardiac Enzymes: Recent Labs  Lab 04/29/20 1725  CKTOTAL 219   BNP (last 3 results) No results for input(s): PROBNP in the last 8760 hours. HbA1C: No results for input(s): HGBA1C in the last 72 hours. CBG: Recent Labs  Lab 05/04/20 0728 05/04/20 1149 05/04/20 1649 05/04/20 2110  05/05/20 0730  GLUCAP 137* 155* 177* 177* 138*   Lipid Profile: No results for input(s): CHOL, HDL, LDLCALC, TRIG, CHOLHDL, LDLDIRECT in the last 72 hours. Thyroid Function Tests: No results for input(s): TSH, T4TOTAL, FREET4, T3FREE, THYROIDAB in the last 72 hours. Anemia Panel: No results for input(s): VITAMINB12, FOLATE, FERRITIN, TIBC, IRON, RETICCTPCT in the last 72 hours. Sepsis Labs: Recent Labs  Lab 04/29/20 1725 04/29/20 1947 04/29/20 2155 04/30/20 0457  PROCALCITON 1.22  --   --   --   LATICACIDVEN  --  2.0* 2.5* 1.5    Recent Results (from the past 240 hour(s))  Respiratory Panel by RT PCR (Flu A&B, Covid) - Nasopharyngeal Swab     Status: None   Collection Time: 04/29/20  3:05 PM   Specimen: Nasopharyngeal Swab  Result Value Ref Range Status   SARS Coronavirus 2 by RT PCR NEGATIVE NEGATIVE Final    Comment: (NOTE) SARS-CoV-2 target nucleic acids are NOT DETECTED.  The SARS-CoV-2 RNA is generally detectable in upper respiratoy specimens during the acute phase of infection. The lowest concentration of SARS-CoV-2 viral copies this assay can detect is 131 copies/mL. A negative result does not preclude SARS-Cov-2 infection and should not be used as the sole basis for treatment or other patient management decisions. A negative result may occur with  improper specimen collection/handling, submission of specimen other than nasopharyngeal swab, presence of viral mutation(s) within the areas targeted by this assay, and inadequate number of viral copies (<131 copies/mL). A negative result must be combined with clinical observations, patient history, and epidemiological information. The expected result is Negative.  Fact Sheet for Patients:  https://www.moore.com/  Fact Sheet for Healthcare Providers:  https://www.young.biz/  This test is no t yet approved or cleared by the [place] FDA and  has been authorized for detection  and/or diagnosis of SARS-CoV-2 by FDA under an Emergency Use Authorization (EUA). This EUA will remain  in effect (meaning this test can be used) for the duration of the COVID-19 declaration under Section 564(b)(1) of the Act, 21 U.S.C. section 360bbb-3(b)(1), unless the authorization is terminated or revoked sooner.     Influenza A by PCR NEGATIVE NEGATIVE Final   Influenza B by PCR NEGATIVE NEGATIVE Final    Comment: (NOTE) The Xpert Xpress SARS-CoV-2/FLU/RSV assay is intended as an aid in  the diagnosis of influenza from Nasopharyngeal swab specimens and  should not be used as a sole basis for treatment. Nasal washings and  aspirates are unacceptable for Xpert Xpress SARS-CoV-2/FLU/RSV  testing.  Fact Sheet for Patients: https://www.moore.com/  Fact Sheet for Healthcare Providers: https://www.young.biz/  This test is not yet approved or cleared by the [place] FDA and  has been authorized for detection and/or diagnosis of SARS-CoV-2 by  FDA under an Emergency Use Authorization (EUA). This EUA will remain  in effect (meaning this test can be used) for the duration of the  Covid-19 declaration under Section 564(b)(1) of the Act, 21  U.S.C. section 360bbb-3(b)(1), unless the authorization is  terminated or revoked. Performed at [facility], [address] [place], [place] [address]   Blood culture (routine x 2)     Status: None   Collection Time: 04/29/20  3:39 PM   Specimen: BLOOD  Result Value Ref Range Status   Specimen Description BLOOD BLOOD LEFT FOREARM  Final   Special Requests   Final    BOTTLES DRAWN AEROBIC AND ANAEROBIC Blood Culture adequate volume   Culture   Final    NO GROWTH 5 DAYS Performed at [facility], [address] [place], [place] [address]    Report Status 05/04/2020 FINAL  Final  Blood culture (routine x 2)     Status: None   Collection Time: 04/29/20  3:44 PM   Specimen: BLOOD   Result Value Ref Range Status   Specimen Description BLOOD BLOOD RIGHT FOREARM  Final   Special Requests   Final  BOTTLES DRAWN AEROBIC AND ANAEROBIC Blood Culture adequate volume   Culture   Final    NO GROWTH 5 DAYS Performed at [facility], [address] [place], [place] [address]    Report Status 05/04/2020 FINAL  Final  Urine culture     Status: Abnormal   Collection Time: 04/29/20  9:00 PM   Specimen: Urine, Catheterized  Result Value Ref Range Status   Specimen Description   Final    URINE, CATHETERIZED Performed at [facility], [address], [place], [place] [address]    Special Requests   Final    NONE Performed at [facility], [address] [place], [place] [address]    Culture MULTIPLE SPECIES PRESENT, SUGGEST RECOLLECTION (A)  Final   Report Status 05/01/2020 FINAL  Final  Aerobic/Anaerobic Culture (surgical/deep wound)     Status: None   Collection Time: 04/30/20  2:35 AM   Specimen: PATH Other; Wound  Result Value Ref Range Status   Specimen Description   Final    PERIRECTAL ABSCESS Performed at [facility], [address] [place], [place] [address]    Special Requests   Final    NONE Performed at [facility], [address] [place], [place] [address]    Gram Stain   Final    ABUNDANT WBC PRESENT, PREDOMINANTLY PMN ABUNDANT GRAM POSITIVE COCCI ABUNDANT GRAM NEGATIVE RODS ABUNDANT GRAM NEGATIVE COCCI    Culture   Final    ABUNDANT ESCHERICHIA COLI ABUNDANT GROUP B STREP(S.AGALACTIAE)ISOLATED TESTING AGAINST S. AGALACTIAE NOT ROUTINELY PERFORMED DUE TO PREDICTABILITY OF AMP/PEN/VAN SUSCEPTIBILITY. ABUNDANT BACTEROIDES FRAGILIS MODERATE PREVOTELLA SPECIES BETA LACTAMASE POSITIVE Performed at [facility], [address], [place], [place] [address]    Report Status 05/03/2020 FINAL  Final   Organism ID, Bacteria ESCHERICHIA COLI  Final      Susceptibility   Escherichia coli - MIC*     AMPICILLIN >=32 RESISTANT Resistant     CEFAZOLIN <=4 SENSITIVE Sensitive     CEFEPIME <=0.12 SENSITIVE Sensitive     CEFTAZIDIME <=1 SENSITIVE Sensitive     CEFTRIAXONE <=0.25 SENSITIVE Sensitive     CIPROFLOXACIN 0.5 SENSITIVE Sensitive     GENTAMICIN <=1 SENSITIVE Sensitive     IMIPENEM 0.5 SENSITIVE Sensitive     TRIMETH/SULFA <=20 SENSITIVE Sensitive     AMPICILLIN/SULBACTAM 16 INTERMEDIATE Intermediate     PIP/TAZO <=4 SENSITIVE Sensitive     * ABUNDANT ESCHERICHIA COLI  Aerobic/Anaerobic Culture (surgical/deep wound)     Status: None   Collection Time: 04/30/20  2:35 AM   Specimen: PATH Other; Wound  Result Value Ref Range Status   Specimen Description   Final    PERIRECTAL ABSCESS Performed at [facility], [address] [place], [place] [address]    Special Requests   Final    NONE Performed at [facility], [address] [place], [place] [address]    Gram Stain   Final    MODERATE WBC PRESENT, PREDOMINANTLY PMN ABUNDANT GRAM NEGATIVE RODS ABUNDANT GRAM POSITIVE COCCI ABUNDANT GRAM NEGATIVE COCCI    Culture   Final    ABUNDANT ESCHERICHIA COLI ABUNDANT GROUP B STREP(S.AGALACTIAE)ISOLATED TESTING AGAINST S. AGALACTIAE NOT ROUTINELY PERFORMED DUE TO PREDICTABILITY OF AMP/PEN/VAN SUSCEPTIBILITY. ABUNDANT BACTEROIDES FRAGILIS FEW PREVOTELLA SPECIES BETA LACTAMASE POSITIVE Performed at [facility], [address], [place], [place] [address]    Report Status 05/03/2020 FINAL  Final   Organism ID, Bacteria ESCHERICHIA COLI  Final      Susceptibility   Escherichia coli - MIC*    AMPICILLIN >=32 RESISTANT Resistant     CEFAZOLIN <=4 SENSITIVE Sensitive     CEFEPIME <=0.12 SENSITIVE Sensitive     CEFTAZIDIME <=1 SENSITIVE Sensitive     CEFTRIAXONE <=0.25 SENSITIVE Sensitive     CIPROFLOXACIN <=0.25 SENSITIVE Sensitive     GENTAMICIN <=1 SENSITIVE Sensitive     IMIPENEM <=0.25 SENSITIVE Sensitive     TRIMETH/SULFA <=20 SENSITIVE  Sensitive     AMPICILLIN/SULBACTAM 16 INTERMEDIATE Intermediate     PIP/TAZO <=4 SENSITIVE Sensitive     * ABUNDANT ESCHERICHIA COLI  Aerobic/Anaerobic Culture (surgical/deep wound)     Status: None   Collection Time: 04/30/20  2:35 AM   Specimen: PATH Other; Tissue  Result Value Ref Range Status   Specimen Description   Final    PERIRECTAL ABSCESS Performed at [facility], [address] [place], [place] [address]    Special Requests   Final    NONE Performed at [facility], [address] [place], [place] [address]    Gram Stain   Final    FEW WBC PRESENT, PREDOMINANTLY PMN ABUNDANT GRAM POSITIVE COCCI ABUNDANT GRAM NEGATIVE RODS    Culture   Final    FEW ESCHERICHIA COLI ABUNDANT GROUP B STREP(S.AGALACTIAE)ISOLATED TESTING AGAINST S. AGALACTIAE NOT ROUTINELY PERFORMED DUE TO PREDICTABILITY OF AMP/PEN/VAN SUSCEPTIBILITY. ABUNDANT BACTEROIDES FRAGILIS MODERATE PREVOTELLA SPECIES BETA LACTAMASE POSITIVE Performed at [facility], [address] [place], [place] [address]    Report Status 05/03/2020 FINAL  Final   Organism ID, Bacteria ESCHERICHIA COLI  Final      Susceptibility   Escherichia coli - MIC*    AMPICILLIN >=32 RESISTANT Resistant     CEFAZOLIN <=4 SENSITIVE Sensitive     CEFEPIME <=0.12 SENSITIVE Sensitive     CEFTAZIDIME <=1 SENSITIVE Sensitive     CEFTRIAXONE <=0.25 SENSITIVE Sensitive     CIPROFLOXACIN <=0.25 SENSITIVE Sensitive     GENTAMICIN <=1 SENSITIVE Sensitive     IMIPENEM 0.5 SENSITIVE Sensitive     TRIMETH/SULFA <=20 SENSITIVE Sensitive     AMPICILLIN/SULBACTAM 16 INTERMEDIATE Intermediate     PIP/TAZO <=4 SENSITIVE Sensitive     * FEW ESCHERICHIA COLI  MRSA PCR Screening     Status: None   Collection Time: 04/30/20  8:27 AM   Specimen: Nasal Mucosa; Nasopharyngeal  Result Value Ref Range Status   MRSA by PCR NEGATIVE NEGATIVE Final    Comment:        The GeneXpert MRSA Assay (FDA approved for NASAL  specimens only), is one component of a comprehensive MRSA colonization surveillance program. It is not intended to diagnose MRSA infection nor to guide or monitor treatment for MRSA infections. Performed at [facility], [address] [place], [place] [address]   Aerobic Culture (superficial specimen)     Status: None   Collection Time: 04/30/20  9:44 PM   Specimen: Leg  Result Value Ref Range Status   Specimen Description   Final    LEG Performed at [facility], [address] [place], [place] [address]    Special Requests   Final    NONE Performed at [facility], [address] [place], [place] [address]    Gram Stain   Final    NO WBC SEEN MODERATE GRAM POSITIVE COCCI RARE YEAST Performed at [facility], [address] [place], [place] [address]  Culture   Final    FEW GROUP B STREP(S.AGALACTIAE)ISOLATED TESTING AGAINST S. AGALACTIAE NOT ROUTINELY PERFORMED DUE TO PREDICTABILITY OF AMP/PEN/VAN SUSCEPTIBILITY. FEW CANDIDA PARAPSILOSIS    Report Status 05/05/2020 FINAL  Final         Radiology Studies: No results found.      Scheduled Meds: . aspirin EC  81 mg Oral Daily  . atorvastatin  20 mg Oral Daily  . Chlorhexidine Gluconate Cloth  6 each Topical Daily  . enoxaparin (LOVENOX) injection  0.5 mg/kg Subcutaneous Q24H  . famotidine  20 mg Oral Daily  . insulin aspart  0-9 Units Subcutaneous TID AC & HS  . insulin aspart  5 Units Subcutaneous TID WC  . insulin glargine  36 Units Subcutaneous Daily  . metoprolol succinate  12.5 mg Oral Daily  . multivitamin with minerals  1 tablet Oral Daily  . Ensure Max Protein  11 oz Oral BID BM  . sodium hypochlorite   Irrigation Once   Continuous Infusions: . albumin human Stopped (04/30/20 0520)  . piperacillin-tazobactam (ZOSYN)  IV 12.5 mL/hr at 05/05/20 0631     LOS: 5 days    Time spent: 27 minutes    Marrion Coy, MD [facility]   To contact  the attending provider between 7A-7P or the covering provider during after hours 7P-7A, please log into the web site [email or web address] and access using universal [place] password for that web site. If you do not have the password, please call the hospital operator.  05/05/2020, 10:38 AM

## 2020-05-05 NOTE — Transfer of Care (Signed)
Immediate Anesthesia Transfer of Care Note  Patient: Michelle Stark  Procedure(s) Performed: INCISION AND DRAINAGE PERINEAL ABSCESS (N/A )  Patient Location: PACU  Anesthesia Type:General  Level of Consciousness: awake, alert  and oriented  Airway & Oxygen Therapy: Patient Spontanous Breathing and Patient connected to face mask oxygen  Post-op Assessment: Report given to RN and Post -op Vital signs reviewed and stable  Post vital signs: Reviewed and stable  Last Vitals:  Vitals Value Taken Time  BP    Temp    Pulse    Resp    SpO2      Last Pain:  Vitals:   05/05/20 1143  TempSrc: Oral  PainSc: 0-No pain      Patients Stated Pain Goal: 0 (04/29/20 1742)  Complications: No complications documented.

## 2020-05-05 NOTE — Op Note (Signed)
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  Wound VAC placement perianal area  FINDINGS; Evidence of  significant improvement,  Some fibrinous tissue and devitalized tissue in need for superficial debridement Cavity measures 8 cm in depth, 6 cm in length and 4 cm in width   SURGEON: Surgeon(s) and Role: * Abner Ardis F, MD - Primary  EBL: 10cc  ANESTHESIA: GETA   DICTATION:  Patient wasexplainedabout theprocedurein detail. Risks,benefits andpossible complications and a consent was obtained. The patient taken to the operating room and placed in thelithotomyposition.  Wound vac with sponge was removed and she was prepped and draped in the usual fashion. Examination revealed significant improvement of the wound.   Excisional debridement of devitalized tissue was performed with laparotomy pads and currette .  Hemostasis was obtained with electrocautery. Pulse lavage irrigation was performed with 3 L of normal saline.  Mastisol applied followed by DuoDERM was attached to the edges of the wound.  The wound on the medial portion was in very close proximity to the anal verge making this very difficult.  I was able to tailor a sponge and place a wound VAC in the standard fashion.  There Was no evidence of any leaks  Needle and laparotomy counts were correct and there were no immediate complications. May attempt to do next wound vac at bedside, if unable may do daily wet/dry.      Morgana Rowley Ronnette Juniper, MD

## 2020-05-05 NOTE — Progress Notes (Signed)
Sepsis screen performed. No signs of sepsis at present time. Pt on IV Abx. Will continue to monitor.

## 2020-05-05 NOTE — Anesthesia Procedure Notes (Signed)
Procedure Name: Intubation Performed by: Fletcher-Harrison, Johnaton Sonneborn, CRNA Pre-anesthesia Checklist: Patient identified, Emergency Drugs available, Suction available and Patient being monitored Patient Re-evaluated:Patient Re-evaluated prior to induction Oxygen Delivery Method: Circle system utilized Preoxygenation: Pre-oxygenation with 100% oxygen Induction Type: IV induction Ventilation: Mask ventilation without difficulty Laryngoscope Size: McGraph and 3 Grade View: Grade I Tube type: Oral Number of attempts: 1 Airway Equipment and Method: Stylet and Oral airway Placement Confirmation: ETT inserted through vocal cords under direct vision,  positive ETCO2,  breath sounds checked- equal and bilateral and CO2 detector Secured at: 21 cm Tube secured with: Tape Dental Injury: Teeth and Oropharynx as per pre-operative assessment        

## 2020-05-05 NOTE — Progress Notes (Signed)
PT Cancellation Note  Patient Details Name: MIYO AINA MRN: [id number] DOB: [birth year]   Cancelled Treatment:    Reason Eval/Treat Not Completed: Patient at procedure or test/unavailable (Patient consult received and reviewed. Patient not in room upon time of PT attempt. Will attempt again at later time/date as available.)  Precious Bard, PT, DPT   05/05/2020, 11:35 AM

## 2020-05-06 ENCOUNTER — Encounter: Payer: Self-pay | Admitting: Surgery

## 2020-05-06 LAB — BASIC METABOLIC PANEL
Anion gap: 9 (ref 5–15)
BUN: 12 mg/dL (ref 8–23)
CO2: 26 mmol/L (ref 22–32)
Calcium: 8.1 mg/dL — ABNORMAL LOW (ref 8.9–10.3)
Chloride: 100 mmol/L (ref 98–111)
Creatinine, Ser: 0.57 mg/dL (ref 0.44–1.00)
GFR, Estimated: >60 mL/min (ref >60)
Glucose, Bld: 254 mg/dL — ABNORMAL HIGH (ref 70–99)
Potassium: 4.3 mmol/L (ref 3.5–5.1)
Sodium: 135 mmol/L (ref 135–145)

## 2020-05-06 LAB — GLUCOSE, CAPILLARY
Glucose-Capillary: 235 mg/dL — ABNORMAL HIGH (ref 70–99)
Glucose-Capillary: 236 mg/dL — ABNORMAL HIGH (ref 70–99)
Glucose-Capillary: 195 mg/dL — ABNORMAL HIGH (ref 70–99)
Glucose-Capillary: 207 mg/dL — ABNORMAL HIGH (ref 70–99)

## 2020-05-06 LAB — CBC WITH DIFFERENTIAL/PLATELET
Abs Immature Granulocytes: 0.13 10*3/uL — ABNORMAL HIGH (ref 0.00–0.07)
Basophils Absolute: 0 10*3/uL (ref 0.0–0.1)
Basophils Relative: 0 %
Eosinophils Absolute: 0 10*3/uL (ref 0.0–0.5)
Eosinophils Relative: 0 %
HCT: 34.7 % — ABNORMAL LOW (ref 36.0–46.0)
Hemoglobin: 11.5 g/dL — ABNORMAL LOW (ref 12.0–15.0)
Immature Granulocytes: 1 %
Lymphocytes Relative: 16 %
Lymphs Abs: 1.9 10*3/uL (ref 0.7–4.0)
MCH: 30.9 pg (ref 26.0–34.0)
MCHC: 33.1 g/dL (ref 30.0–36.0)
MCV: 93.3 fL (ref 80.0–100.0)
Monocytes Absolute: 1 10*3/uL (ref 0.1–1.0)
Monocytes Relative: 9 %
Neutro Abs: 8.6 10*3/uL — ABNORMAL HIGH (ref 1.7–7.7)
Neutrophils Relative %: 74 %
Platelets: 310 10*3/uL (ref 150–400)
RBC: 3.72 MIL/uL — ABNORMAL LOW (ref 3.87–5.11)
RDW: 13.1 % (ref 11.5–15.5)
WBC: 11.6 10*3/uL — ABNORMAL HIGH (ref 4.0–10.5)
nRBC: 0 % (ref 0.0–0.2)

## 2020-05-06 LAB — MAGNESIUM: Magnesium: 1.9 mg/dL (ref 1.7–2.4)

## 2020-05-06 MED ORDER — PROPOFOL 500 MG/50ML IV EMUL
INTRAVENOUS | Status: AC
  Filled 2020-05-06: qty 450

## 2020-05-06 MED ORDER — METRONIDAZOLE IN NACL 5-0.79 MG/ML-% IV SOLN
500.0000 mg | Freq: Three times a day (TID) | INTRAVENOUS | Status: DC
  Administered 2020-05-06 – 2020-05-09 (×9): 500 mg via INTRAVENOUS
  Filled 2020-05-06 (×12): qty 100

## 2020-05-06 MED ORDER — CEFAZOLIN SODIUM-DEXTROSE 2-4 GM/100ML-% IV SOLN
2.0000 g | Freq: Three times a day (TID) | INTRAVENOUS | Status: DC
  Administered 2020-05-06 – 2020-05-12 (×16): 2 g via INTRAVENOUS
  Filled 2020-05-06 (×24): qty 100

## 2020-05-06 MED ORDER — INSULIN GLARGINE 100 UNIT/ML ~~LOC~~ SOLN
40.0000 [IU] | Freq: Every day | SUBCUTANEOUS | Status: DC
  Administered 2020-05-07: 40 [IU] via SUBCUTANEOUS
  Filled 2020-05-06 (×3): qty 0.4

## 2020-05-06 NOTE — Progress Notes (Signed)
PROGRESS NOTE    Michelle Stark  ZOX:[id number] DOB: [birth year] DOA: 04/29/2020 PCP: Michelle Stark   Chief complaint.  Follow-up with DKA and  sacral wound. Brief Narrative:  Michelle Puett Newsomeis a 64 y.o.femalewith medical history significant of HTN, obesity. HLD.Presented withN/V/ increased thirst and fatigue. Patient was unaware she is diabetic Her [facility] was 404.Upon arriving the hospital, her CO2 level was less than 7, she had severe metabolic acidosis. Insulin drip was started. Patient was also seen by general surgery for decubitus sacral wound, had surgical I&D performed on 10/20. Currently covered with antibiotics with Zosyn and vancomycin.  10/21.Patient is seen by ID, antibiotics switched to Zyvox and Zosyn. 10/22.Patient going back to the OR for ID. Patient anion gap has closed for 24hours, CO2 level now 20, patient has mild non-anion gap metabolic acidosis. Continued sodium bicarb tablet, discontinue insulin drip, start Lantus. 10/24.Wound culture growing group B streptococcus. Antibiotics with Zosyn after discussion with infective disease. 10/25. Patient had the third debridement today. Wound looked better    Assessment & Plan:   Active Problems:   Hypertension   HLD (hyperlipidemia)   Obesity   DKA (diabetic ketoacidosis) (HCC)   AKI (acute kidney injury) (HCC)   Dehydration   Metabolic acidosis due to diabetes mellitus (HCC)   Sepsis (HCC)   Abscess and cellulitis of gluteal region   Elevated troponin   Reactive thrombocytosis   Hypophosphatemia  #1.  Uncontrolled type 2 diabetes with hyperglycemia, DKA. Additional nonanion gap metabolic acidosis. DKA has resolved after prolonged insulin treatment. She also required sodium bicarb orally for no anion gap or metabolic acidosis after DKA was corrected. Currently condition had improved. She is currently on scheduled Lantus, scheduled short acting insulin and a sliding scale  insulin. Glucose better controlled.  Appetite is also improving.  #2.  Sepsis with necrotizing soft tissue infection in the sacrum secondary to group B streptococcus. Patient has been seen by general surgery, so far has completed 3 debridements.  Last one was done yesterday, wound looks better [facility] general surgery.  She is currently on a wound VAC.  Probably no additional debridement needed.  Obtain wound care consult. Wound culture so far growing group B streptococcus.  Currently on Zosyn Stark recommendation from infectious disease. Patient can be discharged when wound condition better and no additional debridement is needed. Physical therapy ordered.  #3.  Severe hypophosphatemia. Patient had a phosphorus levels at less than 1.0, that has corrected after supplements.  4.  Non-STEMI. Secondary to demand ischemia from DKA and dehydration.      DVT prophylaxis: Lovenox Code Status: Full Family Communication: son updated  .   Status is: Inpatient  Remains inpatient appropriate because:Inpatient level of care appropriate due to severity of illness, she still on IV antibiotics, we need to make sure that her wound does not need more debridement before discharge.   Dispo: The patient is from: Home              Anticipated d/c is to:?              Anticipated d/c date is: 3 days              Patient currently is not medically stable to d/c.        I/O last 3 completed shifts: In: 1419.6 [P.O.:360; I.V.:500; IV Piggyback:559.6] Out: 1660 [Urine:1650; Blood:10] No intake/output data recorded.     Consultants:   General surgery and ID.  Procedures:  Sacral debridement  Antimicrobials: Zosyn  Subjective: Patient condition improved.  Currently, she has no confusion.  Her appetite finally came back, she denies any abdominal pain or nausea vomiting.  She had moved her bowel. She denies any short of breath or cough. No fever or chills. No headache or dizziness. No dysuria  hematuria.  Objective: Vitals:   05/05/20 1931 05/05/20 2347 05/06/20 0336 05/06/20 0822  BP: (!) 137/53 (!) 120/52 135/69 (!) 135/56  Pulse: 69 87 67 64  Resp: 16 18 16 18   Temp: 98.7 F (37.1 C) 98.8 F (37.1 C) 98.3 F (36.8 C) 98.2 F (36.8 C)  TempSrc: Oral Oral  Oral  SpO2: 94% 93% 97% 97%  Weight:      Height:        Intake/Output Summary (Last 24 hours) at 05/06/2020 0925 Last data filed at 05/05/2020 2206 Gross [facility] 24 hour  Intake 860 ml  Output 1310 ml  Net -450 ml   Filed Weights   04/29/20 1418 04/30/20 0828 05/05/20 1143  Weight: 82.6 kg 103 kg 103 kg    Examination:  General exam: Appears calm and comfortable  Respiratory system: Clear to auscultation. Respiratory effort normal. Cardiovascular system: S1 & S2 heard, RRR. No JVD, murmurs, rubs, gallops or clicks. No pedal edema. Gastrointestinal system: Abdomen is nondistended, soft and nontender. No organomegaly or masses felt. Normal bowel sounds heard. Central nervous system: Alert and oriented. No focal neurological deficits. Extremities: Symmetric 5 x 5 power. Skin: Sacral wound. Psychiatry:  Mood & affect appropriate.     Data Reviewed: I have personally reviewed following labs and imaging studies  CBC: Recent Labs  Lab 05/02/20 0626 05/03/20 0634 05/04/20 0520 05/05/20 0431 05/06/20 0557  WBC 9.4 11.2* 9.9 10.0 11.6*  NEUTROABS 6.9 8.3* 6.6 6.2 8.6*  HGB 11.0* 11.1* 11.0* 11.2* 11.5*  HCT 30.9* 32.6* 32.6* 33.7* 34.7*  MCV 87.5 91.6 92.4 93.4 93.3  PLT 260 251 243 267 310   Basic Metabolic Panel: Recent Labs  Lab 04/29/20 1725 04/29/20 2151 05/02/20 0626 05/03/20 0634 05/04/20 0520 05/05/20 0431 05/06/20 0557  NA 134*   < > 143 142 140 140 135  K 3.4*   < > 3.6 3.8 3.4* 3.8 4.3  CL 106   < > 116* 110 106 105 100  CO2 <7*   < > 20* 22 24 26 26   GLUCOSE 527*   < > 180* 277* 159* 145* 254*  BUN 43*   < > 21 18 13 11 12   CREATININE 1.37*   < > 0.65 0.65 0.44 0.51 0.57   CALCIUM 8.1*   < > 8.1* 8.3* 7.9* 7.8* 8.1*  MG 2.2   < > 1.8 1.9 1.8 1.8 1.9  PHOS 4.4  --  <1.0* 1.6* 2.7 2.9  --    < > = values in this interval not displayed.   GFR: Estimated Creatinine Clearance: 80 mL/min (by C-G formula based on SCr of 0.57 mg/dL). Liver Function Tests: Recent Labs  Lab 04/30/20 1246  AST 41  ALT 27  ALKPHOS 96  BILITOT 0.6  PROT 6.2*  ALBUMIN 2.4*   Recent Labs  Lab 04/29/20 1725  LIPASE 90*   No results for input(s): AMMONIA in the last 168 hours. Coagulation Profile: Recent Labs  Lab 04/29/20 2155  INR 1.3*   Cardiac Enzymes: Recent Labs  Lab 04/29/20 1725  CKTOTAL 219   BNP (last 3 results) No results for input(s): PROBNP in the last 8760 hours.  HbA1C: No results for input(s): HGBA1C in the last 72 hours. CBG: Recent Labs  Lab 05/05/20 0730 05/05/20 1147 05/05/20 1701 05/05/20 2115 05/06/20 0719  GLUCAP 138* 117* 206* 273* 235*   Lipid Profile: No results for input(s): CHOL, HDL, LDLCALC, TRIG, CHOLHDL, LDLDIRECT in the last 72 hours. Thyroid Function Tests: No results for input(s): TSH, T4TOTAL, FREET4, T3FREE, THYROIDAB in the last 72 hours. Anemia Panel: No results for input(s): VITAMINB12, FOLATE, FERRITIN, TIBC, IRON, RETICCTPCT in the last 72 hours. Sepsis Labs: Recent Labs  Lab 04/29/20 1725 04/29/20 1947 04/29/20 2155 04/30/20 0457  PROCALCITON 1.22  --   --   --   LATICACIDVEN  --  2.0* 2.5* 1.5    Recent Results (from the past 240 hour(s))  Respiratory Panel by RT PCR (Flu A&B, Covid) - Nasopharyngeal Swab     Status: None   Collection Time: 04/29/20  3:05 PM   Specimen: Nasopharyngeal Swab  Result Value Ref Range Status   SARS Coronavirus 2 by RT PCR NEGATIVE NEGATIVE Final    Comment: (NOTE) SARS-CoV-2 target nucleic acids are NOT DETECTED.  The SARS-CoV-2 RNA is generally detectable in upper respiratoy specimens during the acute phase of infection. The lowest concentration of SARS-CoV-2 viral  copies this assay can detect is 131 copies/mL. A negative result does not preclude SARS-Cov-2 infection and should not be used as the sole basis for treatment or other patient management decisions. A negative result may occur with  improper specimen collection/handling, submission of specimen other than nasopharyngeal swab, presence of viral mutation(s) within the areas targeted by this assay, and inadequate number of viral copies (<131 copies/mL). A negative result must be combined with clinical observations, patient history, and epidemiological information. The expected result is Negative.  Fact Sheet for Patients:  https://www.moore.com/  Fact Sheet for Healthcare Providers:  https://www.young.biz/  This test is no t yet approved or cleared by the [place] FDA and  has been authorized for detection and/or diagnosis of SARS-CoV-2 by FDA under an Emergency Use Authorization (EUA). This EUA will remain  in effect (meaning this test can be used) for the duration of the COVID-19 declaration under Section 564(b)(1) of the Act, 21 U.S.C. section 360bbb-3(b)(1), unless the authorization is terminated or revoked sooner.     Influenza A by PCR NEGATIVE NEGATIVE Final   Influenza B by PCR NEGATIVE NEGATIVE Final    Comment: (NOTE) The Xpert Xpress SARS-CoV-2/FLU/RSV assay is intended as an aid in  the diagnosis of influenza from Nasopharyngeal swab specimens and  should not be used as a sole basis for treatment. Nasal washings and  aspirates are unacceptable for Xpert Xpress SARS-CoV-2/FLU/RSV  testing.  Fact Sheet for Patients: https://www.moore.com/  Fact Sheet for Healthcare Providers: https://www.young.biz/  This test is not yet approved or cleared by the [place] FDA and  has been authorized for detection and/or diagnosis of SARS-CoV-2 by  FDA under an Emergency Use Authorization (EUA). This  EUA will remain  in effect (meaning this test can be used) for the duration of the  Covid-19 declaration under Section 564(b)(1) of the Act, 21  U.S.C. section 360bbb-3(b)(1), unless the authorization is  terminated or revoked. Performed at [facility], [address] [place], [place] [address]   Blood culture (routine x 2)     Status: None   Collection Time: 04/29/20  3:39 PM   Specimen: BLOOD  Result Value Ref Range Status   Specimen Description BLOOD BLOOD LEFT FOREARM  Final  Special Requests   Final    BOTTLES DRAWN AEROBIC AND ANAEROBIC Blood Culture adequate volume   Culture   Final    NO GROWTH 5 DAYS Performed at [facility], [address] [place], [place] [address]    Report Status 05/04/2020 FINAL  Final  Blood culture (routine x 2)     Status: None   Collection Time: 04/29/20  3:44 PM   Specimen: BLOOD  Result Value Ref Range Status   Specimen Description BLOOD BLOOD RIGHT FOREARM  Final   Special Requests   Final    BOTTLES DRAWN AEROBIC AND ANAEROBIC Blood Culture adequate volume   Culture   Final    NO GROWTH 5 DAYS Performed at [facility], [address] [place], [place] [address]    Report Status 05/04/2020 FINAL  Final  Urine culture     Status: Abnormal   Collection Time: 04/29/20  9:00 PM   Specimen: Urine, Catheterized  Result Value Ref Range Status   Specimen Description   Final    URINE, CATHETERIZED Performed at [facility], [address], [place], [place] [address]    Special Requests   Final    NONE Performed at [facility], [address] [place], [place] [address]    Culture MULTIPLE SPECIES PRESENT, SUGGEST RECOLLECTION (A)  Final   Report Status 05/01/2020 FINAL  Final  Aerobic/Anaerobic Culture (surgical/deep wound)     Status: None   Collection Time: 04/30/20  2:35 AM   Specimen: PATH Other; Wound  Result Value Ref Range Status   Specimen Description   Final    PERIRECTAL  ABSCESS Performed at [facility], [address] [place], [place] [address]    Special Requests   Final    NONE Performed at [facility], [address] [place], [place] [address]    Gram Stain   Final    ABUNDANT WBC PRESENT, PREDOMINANTLY PMN ABUNDANT GRAM POSITIVE COCCI ABUNDANT GRAM NEGATIVE RODS ABUNDANT GRAM NEGATIVE COCCI    Culture   Final    ABUNDANT ESCHERICHIA COLI ABUNDANT GROUP B STREP(S.AGALACTIAE)ISOLATED TESTING AGAINST S. AGALACTIAE NOT ROUTINELY PERFORMED DUE TO PREDICTABILITY OF AMP/PEN/VAN SUSCEPTIBILITY. ABUNDANT BACTEROIDES FRAGILIS MODERATE PREVOTELLA SPECIES BETA LACTAMASE POSITIVE Performed at [facility], [address], [place], [place] [address]    Report Status 05/03/2020 FINAL  Final   Organism ID, Bacteria ESCHERICHIA COLI  Final      Susceptibility   Escherichia coli - MIC*    AMPICILLIN >=32 RESISTANT Resistant     CEFAZOLIN <=4 SENSITIVE Sensitive     CEFEPIME <=0.12 SENSITIVE Sensitive     CEFTAZIDIME <=1 SENSITIVE Sensitive     CEFTRIAXONE <=0.25 SENSITIVE Sensitive     CIPROFLOXACIN 0.5 SENSITIVE Sensitive     GENTAMICIN <=1 SENSITIVE Sensitive     IMIPENEM 0.5 SENSITIVE Sensitive     TRIMETH/SULFA <=20 SENSITIVE Sensitive     AMPICILLIN/SULBACTAM 16 INTERMEDIATE Intermediate     PIP/TAZO <=4 SENSITIVE Sensitive     * ABUNDANT ESCHERICHIA COLI  Aerobic/Anaerobic Culture (surgical/deep wound)     Status: None   Collection Time: 04/30/20  2:35 AM   Specimen: PATH Other; Wound  Result Value Ref Range Status   Specimen Description   Final    PERIRECTAL ABSCESS Performed at [facility], [address] [place], [place] [address]    Special Requests   Final    NONE Performed at [facility], [address] [place], [place] [address]    Gram Stain   Final    MODERATE WBC PRESENT, PREDOMINANTLY PMN ABUNDANT GRAM NEGATIVE RODS ABUNDANT GRAM POSITIVE COCCI ABUNDANT GRAM NEGATIVE  COCCI    Culture   Final    ABUNDANT ESCHERICHIA COLI ABUNDANT GROUP B STREP(S.AGALACTIAE)ISOLATED TESTING AGAINST S. AGALACTIAE NOT ROUTINELY PERFORMED DUE TO PREDICTABILITY OF AMP/PEN/VAN SUSCEPTIBILITY. ABUNDANT BACTEROIDES FRAGILIS FEW PREVOTELLA SPECIES BETA LACTAMASE POSITIVE Performed at [facility], [address] [place], [place] [address]    Report Status 05/03/2020 FINAL  Final   Organism ID, Bacteria ESCHERICHIA COLI  Final      Susceptibility   Escherichia coli - MIC*    AMPICILLIN >=32 RESISTANT Resistant     CEFAZOLIN <=4 SENSITIVE Sensitive     CEFEPIME <=0.12 SENSITIVE Sensitive     CEFTAZIDIME <=1 SENSITIVE Sensitive     CEFTRIAXONE <=0.25 SENSITIVE Sensitive     CIPROFLOXACIN <=0.25 SENSITIVE Sensitive     GENTAMICIN <=1 SENSITIVE Sensitive     IMIPENEM <=0.25 SENSITIVE Sensitive     TRIMETH/SULFA <=20 SENSITIVE Sensitive     AMPICILLIN/SULBACTAM 16 INTERMEDIATE Intermediate     PIP/TAZO <=4 SENSITIVE Sensitive     * ABUNDANT ESCHERICHIA COLI  Aerobic/Anaerobic Culture (surgical/deep wound)     Status: None   Collection Time: 04/30/20  2:35 AM   Specimen: PATH Other; Tissue  Result Value Ref Range Status   Specimen Description   Final    PERIRECTAL ABSCESS Performed at [facility], [address] [place], [place] [address]    Special Requests   Final    NONE Performed at [facility], [address] [place], [place] [address]    Gram Stain   Final    FEW WBC PRESENT, PREDOMINANTLY PMN ABUNDANT GRAM POSITIVE COCCI ABUNDANT GRAM NEGATIVE RODS    Culture   Final    FEW ESCHERICHIA COLI ABUNDANT GROUP B STREP(S.AGALACTIAE)ISOLATED TESTING AGAINST S. AGALACTIAE NOT ROUTINELY PERFORMED DUE TO PREDICTABILITY OF AMP/PEN/VAN SUSCEPTIBILITY. ABUNDANT BACTEROIDES FRAGILIS MODERATE PREVOTELLA SPECIES BETA LACTAMASE POSITIVE Performed at [facility], [address] [place], [place] [address]    Report Status 05/03/2020  FINAL  Final   Organism ID, Bacteria ESCHERICHIA COLI  Final      Susceptibility   Escherichia coli - MIC*    AMPICILLIN >=32 RESISTANT Resistant     CEFAZOLIN <=4 SENSITIVE Sensitive     CEFEPIME <=0.12 SENSITIVE Sensitive     CEFTAZIDIME <=1 SENSITIVE Sensitive     CEFTRIAXONE <=0.25 SENSITIVE Sensitive     CIPROFLOXACIN <=0.25 SENSITIVE Sensitive     GENTAMICIN <=1 SENSITIVE Sensitive     IMIPENEM 0.5 SENSITIVE Sensitive     TRIMETH/SULFA <=20 SENSITIVE Sensitive     AMPICILLIN/SULBACTAM 16 INTERMEDIATE Intermediate     PIP/TAZO <=4 SENSITIVE Sensitive     * FEW ESCHERICHIA COLI  MRSA PCR Screening     Status: None   Collection Time: 04/30/20  8:27 AM   Specimen: Nasal Mucosa; Nasopharyngeal  Result Value Ref Range Status   MRSA by PCR NEGATIVE NEGATIVE Final    Comment:        The GeneXpert MRSA Assay (FDA approved for NASAL specimens only), is one component of a comprehensive MRSA colonization surveillance program. It is not intended to diagnose MRSA infection nor to guide or monitor treatment for MRSA infections. Performed at [facility], [address] [place], [place] [address]   Aerobic Culture (superficial specimen)     Status:  None   Collection Time: 04/30/20  9:44 PM   Specimen: Leg  Result Value Ref Range Status   Specimen Description   Final    LEG Performed at [facility], [address] [place], [place] [address]    Special Requests   Final    NONE Performed at [facility], [address] [place], [place] [address]    Gram Stain   Final    NO WBC SEEN MODERATE GRAM POSITIVE COCCI RARE YEAST Performed at [facility], [address], [place], [place] [address]    Culture   Final    FEW GROUP B STREP(S.AGALACTIAE)ISOLATED TESTING AGAINST S. AGALACTIAE NOT ROUTINELY PERFORMED DUE TO PREDICTABILITY OF AMP/PEN/VAN SUSCEPTIBILITY. FEW CANDIDA PARAPSILOSIS    Report Status 05/05/2020 FINAL  Final          Radiology Studies: No results found.      Scheduled Meds: . aspirin EC  81 mg Oral Daily  . atorvastatin  20 mg Oral Daily  . Chlorhexidine Gluconate Cloth  6 each Topical Daily  . enoxaparin (LOVENOX) injection  0.5 mg/kg Subcutaneous Q24H  . famotidine  20 mg Oral Daily  . fluconazole  200 mg Oral q1800  . insulin aspart  0-9 Units Subcutaneous TID AC & HS  . insulin aspart  5 Units Subcutaneous TID WC  . insulin glargine  36 Units Subcutaneous Daily  . metoprolol succinate  12.5 mg Oral Daily  . multivitamin with minerals  1 tablet Oral Daily  . Ensure Max Protein  11 oz Oral BID BM  . sodium hypochlorite   Irrigation Once   Continuous Infusions: . albumin human Stopped (04/30/20 0520)  . piperacillin-tazobactam (ZOSYN)  IV 3.375 g (05/06/20 0407)     LOS: 6 days    Time spent: 28 minutes    Marrion Coy, MD [facility]   To contact the attending provider between 7A-7P or the covering provider during after hours 7P-7A, please log into the web site [email or web address] and access using universal [place] password for that web site. If you do not have the password, please call the hospital operator.  05/06/2020, 9:25 AM

## 2020-05-06 NOTE — Progress Notes (Signed)
   Date of Admission:  04/29/2020    ID: Michelle Stark is a 64 y.o. female  Active Problems:   Hypertension   HLD (hyperlipidemia)   Obesity   DKA (diabetic ketoacidosis) (HCC)   AKI (acute kidney injury) (HCC)   Dehydration   Metabolic acidosis due to diabetes mellitus (HCC)   Sepsis (HCC)   Abscess and cellulitis of gluteal region   Elevated troponin   Reactive thrombocytosis   Hypophosphatemia    Subjective: Doing better Says she participated in PT, sat in the eob, moved a bit  Medications:  . aspirin EC  81 mg Oral Daily  . atorvastatin  20 mg Oral Daily  . Chlorhexidine Gluconate Cloth  6 each Topical Daily  . enoxaparin (LOVENOX) injection  0.5 mg/kg Subcutaneous Q24H  . famotidine  20 mg Oral Daily  . fluconazole  200 mg Oral q1800  . insulin aspart  0-9 Units Subcutaneous TID AC & HS  . insulin aspart  5 Units Subcutaneous TID WC  . [START ON 05/07/2020] insulin glargine  40 Units Subcutaneous Daily  . metoprolol succinate  12.5 mg Oral Daily  . multivitamin with minerals  1 tablet Oral Daily  . Ensure Max Protein  11 oz Oral BID BM  . sodium hypochlorite   Irrigation Once    Objective: Vital signs in last 24 hours: Temp:  [98.2 F (36.8 C)-98.8 F (37.1 C)] 98.7 F (37.1 C) (10/26 1140) Pulse Rate:  [64-87] 70 (10/26 1140) Resp:  [16-18] 18 (10/26 1140) BP: (112-137)/(52-69) 112/56 (10/26 1140) SpO2:  [93 %-97 %] 96 % (10/26 1140)  PHYSICAL EXAM:  General: Alert, cooperative, no distress, appears stated age.  Head: Normocephalic, without obvious abnormality, atraumatic. Eyes: Conjunctivae clear, anicteric sclerae. Pupils are equal ENT Nares normal. No drainage or sinus tenderness. Lips, mucosa, and tongue normal. No Thrush Neck: Supple, symmetrical, no adenopathy, thyroid: non tender no carotid bruit and no JVD. Back: No CVA tenderness. Lungs: Clear to auscultation bilaterally. No Wheezing or Rhonchi. No rales. Heart: Regular rate and rhythm,  no murmur, rub or gallop. Abdomen: Soft, non-tender,not distended. Bowel sounds normal. No masses Dressing not removed from the perineal area Extremities: atraumatic, no cyanosis. No edema. No clubbing Skin: No rashes or lesions. Or bruising Lymph: Cervical, supraclavicular normal. Neurologic: Grossly non-focal  Lab Results Recent Labs    05/05/20 0431 05/06/20 0557  WBC 10.0 11.6*  HGB 11.2* 11.5*  HCT 33.7* 34.7*  NA 140 135  K 3.8 4.3  CL 105 100  CO2 26 26  BUN 11 12  CREATININE 0.51 0.57  Microbiology: ABUNDANT ESCHERICHIA COLI  ABUNDANT GROUP B STREP(S.AGALACTIAE)ISOLATED  TESTING AGAINST S. AGALACTIAE NOT ROUTINELY PERFORMED DUE TO PREDICTABILITY OF AMP/PEN/VAN SUSCEPTIBILITY.  ABUNDANT BACTEROIDES FRAGILIS  FEW PREVOTELLA SPECIES  BETA LACTAMASE POSITIVE  Studies/Results: No results found.   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

## 2020-05-06 NOTE — TOC Progression Note (Signed)
Transition of Care [facility]) - Progression Note    Patient Details  Name: [name] MRN: [id number] Date of Birth: 09-21-1955  [facility] CM/SW Contact  Chapman Fitch, RN Phone Number: 05/06/2020, 4:04 PM  Clinical Narrative:     Patient admitted for DKA and Sepsis with necrotizing soft tissue infection   Patient lives at home alone Still works, independent, no DME at home, and drives.    Received insurance card from patient's son.  Updated information in Epic  [facility], [facility], [facility], [facility], [facility] not in net work.    [facility] in net work but unable to accept referral .   Awaiting determination from surgery on specific needs for discharge  anticipated needs PCP, home health RN and wound vac.  [facility] with [facility] checking to see if they are in network with insurance for wound vac  Expected Discharge Plan: Home w Home Health Services    Expected Discharge Plan and Services Expected Discharge Plan: Home w Home Health Services   Discharge Planning Services: CM Consult   Living arrangements for the past 2 months: Single Family Home                                       Social Determinants of Health (SDOH) Interventions    Readmission Risk Interventions No flowsheet data found.

## 2020-05-06 NOTE — Consult Note (Signed)
WOC consulted for sacral wound, patient does not have sacral wound. S/P debridement of perianal area. Necrotizing soft tissue infection, with multiple debridements.   Communication with surgery team; will change dressing at the bedside with surgery PA tomorrow.   Judieth [facility], CNS, [employer] [phone]

## 2020-05-06 NOTE — Progress Notes (Signed)
[facility] SURGICAL PROGRESS NOTE  Hospital Day(s): 6.   Post op day(s): 1 Day Post-Op.   Interval History:  Patient seen and examined no acute events or new complaints overnight.  Patient reports she is sore but overall doing well She is up working with therapies Slight bump in leukocytosis to 11.6K; no fevers Renal function remains normal, sCr - 0.57, UO - 1.3L No significant electrolyte derangements Wound vac in place, serosanguinous output  Vital signs in last 24 hours: [min-max] current  Temp:  [98.2 F (36.8 C)-98.8 F (37.1 C)] 98.3 F (36.8 C) (10/26 0336) Pulse Rate:  [67-87] 67 (10/26 0336) Resp:  [16-22] 16 (10/26 0336) BP: (120-142)/(52-81) 135/69 (10/26 0336) SpO2:  [93 %-99 %] 97 % (10/26 0336) Weight:  [103 kg] 103 kg (10/25 1143)     Height: 5\' 2"  (157.5 cm) Weight: 103 kg BMI (Calculated): 41.52   Intake/Output last 2 shifts:  10/25 0701 - 10/26 0700 In: 860 [P.O.:360; I.V.:500] Out: 1310 [Urine:1300; Blood:10]   Physical Exam:  Constitutional: alert, cooperative and no distress  Respiratory: breathing non-labored at rest  Cardiovascular: regular rate and sinus rhythm  Integumentary: Wound vac to perianal area, serosanguinous output  Labs:  CBC Latest Ref Rng & Units 05/06/2020 05/05/2020 05/04/2020  WBC 4.0 - 10.5 K/uL 11.6(H) 10.0 9.9  Hemoglobin 12.0 - 15.0 g/dL 11.5(L) 11.2(L) 11.0(L)  Hematocrit 36 - 46 % 34.7(L) 33.7(L) 32.6(L)  Platelets 150 - 400 K/uL 310 267 243   CMP Latest Ref Rng & Units 05/06/2020 05/05/2020 05/04/2020  Glucose 70 - 99 mg/dL 05/06/2020) 751(W) 258(N)  BUN 8 - 23 mg/dL 12 11 13   Creatinine 0.44 - 1.00 mg/dL 277(O 2.42  Sodium 135 - 145 mmol/L 135 140 140  Potassium 3.5 - 5.1 mmol/L 4.3 3.8 3.4(L)  Chloride 98 - 111 mmol/L 100 105 106  CO2 22 - 32 mmol/L 26 26 24   Calcium 8.9 - 10.3 mg/dL 8.1(L) 7.8(L) 7.9(L)  Total Protein 6.5 - 8.1 g/dL - - -  Total Bilirubin 0.3 - 1.2 mg/dL - - -  Alkaline Phos  38 - 126 U/L - - -  AST 15 - 41 U/L - - -  ALT 0 - 44 U/L - - -     Imaging studies: No new pertinent imaging studies   Assessment/Plan:  64 y.o. 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  -- 6.14, PA-C [facility] 05/06/2020, 7:49 AM [phone] M-F: 7am - 4pm

## 2020-05-06 NOTE — Progress Notes (Signed)
Pt admitted with Sepsis. Sepsis screen not appropriate at this time. 

## 2020-05-06 NOTE — Evaluation (Addendum)
Physical Therapy Evaluation Patient Details Name: Michelle Stark MRN: [id number] DOB: Sep 17, 1955 Today's Date: 05/06/2020   History of Present Illness  Patient is a 64 year old female who presented to Ed for nausea vominting, increased thirst and fatigue, was found to be in severe metabolic acidosis. Was found to have a sacral wound with surgical I and D performed on 10/20, 10/22 adiditional surgery for ID, 10/25 third depredment .  with PMH of HTN, obesity, HLD.    Clinical Impression  Pt alert, in bed, A&Ox4, reported more soreness/pain today than previously. Pt stated at baseline she is independent, lives alone (sons live nearby), works, drives, Catering manager. She stated she could probably stay with one of her sons if she had to at discharge.  Extensive time spent during session on education/technique to minimize shearing/sliding forces on buttocks to avoid worsening her wound or dislodging her wound vac. She was able to perform log rolling technique in and out of bed with supervision and verbal cues. Sit <> stand with RW and CGA 3 times during session, and she was able to take several steps forwards/backwards/laterally. Pt more limited by fatigue and leg weakness per her report than pain.  Overall the patient demonstrated deficits (see "PT Problem List") that impede the patient's functional abilities, safety, and mobility and would benefit from skilled PT intervention. Recommendation is HHPT with intermittent supervision as well as supervision for mobility/OOB pending pt progress.     Follow Up Recommendations Home health PT;Supervision - Intermittent;Supervision for mobility/OOB    Equipment Recommendations  3in1 (PT);Other (comment) (pt reported she has access to RW and manual WC if needed)    Recommendations for Other Services OT consult     Precautions / Restrictions Precautions Precautions: Fall Precaution Comments: wound vac Restrictions Weight Bearing Restrictions: No       Mobility  Bed Mobility Overal bed mobility: Needs Assistance Bed Mobility: Rolling;Sidelying to Sit;Sit to Sidelying Rolling: Supervision Sidelying to sit: Supervision     Sit to sidelying: Supervision;HOB elevated General bed mobility comments: log roll technique to minimize shearing/wound vac displacement    Transfers Overall transfer level: Needs assistance Equipment used: Rolling walker (2 wheeled) Transfers: Sit to/from Stand Sit to Stand: Min guard         General transfer comment: heavy use of UEs and bilateral hands on RW  Ambulation/Gait             General Gait Details: Pt able to take several steps forwards/backwards twice after seated rest break, fatigued quickly  Stairs            Wheelchair Mobility    Modified Rankin (Stroke Patients Only)       Balance Overall balance assessment: Needs assistance Sitting-balance support: Feet supported;Bilateral upper extremity supported Sitting balance-Leahy Scale: Fair       Standing balance-Leahy Scale: Fair Standing balance comment: reliant on UE support due to leg fatigue                             Pertinent Vitals/Pain Pain Assessment: Faces Faces Pain Scale: Hurts a little bit Pain Location: buttocks with mobility Pain Descriptors / Indicators: Grimacing;Aching;Guarding Pain Intervention(s): Limited activity within patient's tolerance;Monitored during session;Repositioned    Home Living Family/patient expects to be discharged to:: Private residence Living Arrangements: Alone Available Help at Discharge: Family;Available PRN/intermittently;Other (Comment) (sons live nearby) Type of Home: House Home Access: Stairs to enter;Ramped entrance     Home Layout:  One level Home Equipment: Grab bars - tub/shower;Walker - 2 wheels;Wheelchair - manual Additional Comments: drives, works full time    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  Dominance   Dominant Hand: Right    Extremity/Trunk Assessment   Upper Extremity Assessment Upper Extremity Assessment: Generalized weakness    Lower Extremity Assessment Lower Extremity Assessment: Generalized weakness    Cervical / Trunk Assessment Cervical / Trunk Assessment: Normal  Communication   Communication: No difficulties  Cognition Arousal/Alertness: Awake/alert Behavior During Therapy: WFL for tasks assessed/performed Overall Cognitive Status: Within Functional Limits for tasks assessed                                        General Comments      Exercises Other Exercises Other Exercises: Pt performed sit <> stand x3 during session, able to step forwards/backwards/laterally, CGA with RW. Fatigued quickly but eager to maximize mobility Other Exercises: Extensive time spent educating pt on methods to decrease shearing forces or sliding on surfaces to avoid dislodging wound vac/irritating her wound   Assessment/Plan    PT Assessment Patient needs continued PT services  PT Problem List Decreased strength;Decreased mobility;Decreased range of motion;Decreased activity tolerance;Decreased balance;Decreased knowledge of use of DME;Decreased knowledge of precautions       PT Treatment Interventions DME instruction;Therapeutic exercise;Gait training;Balance training;Neuromuscular re-education;Functional mobility training;Therapeutic activities;Patient/family education    PT Goals (Current goals can be found in the Care Plan section)  Acute Rehab PT Goals Patient Stated Goal: to go home PT Goal Formulation: With patient Time For Goal Achievement: 05/20/20 Potential to Achieve Goals: Good    Frequency Min 2X/week   Barriers to discharge        Co-evaluation               AM-PAC PT "6 Clicks" Mobility  Outcome Measure Help needed turning from your back to your side while in a flat bed without using bedrails?: A Little Help needed moving  from lying on your back to sitting on the side of a flat bed without using bedrails?: A Little Help needed moving to and from a bed to a chair (including a wheelchair)?: A Little Help needed standing up from a chair using your arms (e.g., wheelchair or bedside chair)?: A Little Help needed to walk in hospital room?: A Little Help needed climbing 3-5 steps with a railing? : A Lot 6 Click Score: 17    End of Session Equipment Utilized During Treatment: Gait belt Activity Tolerance: Patient tolerated treatment well Patient left: in bed;with call bell/phone within reach;with bed alarm set;with nursing/sitter in room Nurse Communication: Mobility status PT Visit Diagnosis: Muscle weakness (generalized) (M62.81);Other abnormalities of gait and mobility (R26.89);Difficulty in walking, not elsewhere classified (R26.2)    Time: 6962-9528 PT Time Calculation (min) (ACUTE ONLY): 39 min   Charges:   PT Evaluation $PT Eval Low Complexity: 1 Low PT Treatments $Therapeutic Exercise: 23-37 mins      Olga Coaster PT, DPT 10:53 AM,05/06/20

## 2020-05-06 NOTE — Plan of Care (Addendum)
Pt Axox4. Calm and cooperative and able to voice her needs. Wound vac in place, intact with small amount of serosanguinous drainage at . Pt denied pain all night. Pt on IV Abx. Tolerates her diet well. Foley in place with clear yellow urine. Safety measures in place. Will continue to monitor.  Problem: Education: Goal: Knowledge of General Education information will improve Description: Including pain rating scale, medication(s)/side effects and non-pharmacologic comfort measures Outcome: Progressing   Problem: Health Behavior/Discharge Planning: Goal: Ability to manage health-related needs will improve Outcome: Progressing   Problem: Clinical Measurements: Goal: Ability to maintain clinical measurements within normal limits will improve Outcome: Progressing Goal: Will remain free from infection Outcome: Progressing Goal: Diagnostic test results will improve Outcome: Progressing Goal: Respiratory complications will improve Outcome: Progressing Goal: Cardiovascular complication will be avoided Outcome: Progressing   Problem: Activity: Goal: Risk for activity intolerance will decrease Outcome: Progressing   Problem: Nutrition: Goal: Adequate nutrition will be maintained Outcome: Progressing   Problem: Coping: Goal: Level of anxiety will decrease Outcome: Progressing   Problem: Elimination: Goal: Will not experience complications related to bowel motility Outcome: Progressing Goal: Will not experience complications related to urinary retention Outcome: Progressing   Problem: Pain Managment: Goal: General experience of comfort will improve Outcome: Progressing   Problem: Safety: Goal: Ability to remain free from injury will improve Outcome: Progressing   Problem: Skin Integrity: Goal: Risk for impaired skin integrity will decrease Outcome: Progressing

## 2020-05-06 NOTE — Evaluation (Signed)
Occupational Therapy Evaluation Patient Details Name: Michelle Stark MRN: [id number] DOB: Nov 18, 1955 Today's Date: 05/06/2020    History of Present Illness Patient is a 64 year old female who presented to Ed for nausea vominting, increased thirst and fatigue, was found to be in severe metabolic acidosis. Was found to have a sacral wound with surgical I and D performed on 10/20, 10/22 adiditional surgery for ID, 10/25 third depredment .  with PMH of HTN, obesity, HLD.   Clinical Impression   Pt was seen for OT evaluation this date. 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 Pt lives alone but her adult sons live nearby and able to assist as needed upon pt's discharge from the hospital. Pt denies pain. Min A for bed mobility and LB ADL tasks. Instructed in AE/DME and energy conservation strategies to maximize safety/independence with ADL and mobility while also minimizing sheer/injury to perianal wound. Wound vac noted to have low pressure, RN notified and assisted with applying additional support to wound vac seal with therapist providing Min A to maintain sidelying and positioning for access. Currently pt demonstrates impairments as described below (See OT problem list) which functionally limit *his/her ability to perform ADL/self-care tasks. Pt would benefit from skilled OT services to address noted impairments and functional limitations (see below for any additional details) in order to maximize safety and independence while minimizing falls risk and caregiver burden. Upon hospital discharge, recommend HHOT to maximize pt safety and return to functional independence during meaningful occupations of daily life.     Follow Up Recommendations  Home health OT    Equipment Recommendations  None recommended by OT    Recommendations for Other Services       Precautions / Restrictions Precautions Precautions:  Fall Precaution Comments: wound vac Restrictions Weight Bearing Restrictions: No      Mobility Bed Mobility Overal bed mobility: Needs Assistance Bed Mobility: Rolling Rolling: Min assist         General bed mobility comments: Min A for end range rolling so RN could reassess wound vac placement    Transfers                      Balance                                           ADL either performed or assessed with clinical judgement   ADL Overall ADL's : Needs assistance/impaired                                       General ADL Comments: PRN Min A for LB ADL tasks, fatigues quickly, CGA for ADL transfers with RW     Vision Patient Visual Report: No change from baseline       Perception     Praxis      Pertinent Vitals/Pain Pain Assessment: No/denies pain (endorses "sore"  but not true pain)     Hand Dominance Right   Extremity/Trunk Assessment Upper Extremity Assessment Upper Extremity Assessment: Generalized weakness   Lower Extremity Assessment Lower Extremity Assessment: Generalized weakness   Cervical / Trunk Assessment Cervical / Trunk Assessment: Normal   Communication Communication Communication: No difficulties   Cognition Arousal/Alertness: Awake/alert Behavior During  Therapy: WFL for tasks assessed/performed Overall Cognitive Status: Within Functional Limits for tasks assessed                                     General Comments       Exercises Other Exercises Other Exercises: Pt instructed in energy conservation strategies and AE/DME for LB ADL tasks to minimize sheering/injury to the wound   Shoulder Instructions      Home Living Family/patient expects to be discharged to:: Private residence Living Arrangements: Alone Available Help at Discharge: Family;Available PRN/intermittently;Other (Comment) (sons live nearby) Type of Home: House Home Access: Stairs to  enter;Ramped entrance     Home Layout: One level     Bathroom Shower/Tub: Chief Strategy Officer: Standard     Home Equipment: Grab bars - tub/shower;Walker - 2 wheels;Wheelchair - manual   Additional Comments: drives ( commute to work), works full time (primarily desk work)      Prior Functioning/Environment Level of Independence: Independent                 OT Problem List: Decreased strength;Decreased activity tolerance;Decreased knowledge of use of DME or AE      OT Treatment/Interventions: Self-care/ADL training;Therapeutic exercise;Therapeutic activities;DME and/or AE instruction;Energy conservation;Patient/family education;Balance training    OT Goals(Current goals can be found in the care plan section) Acute Rehab OT Goals Patient Stated Goal: get better and go home OT Goal Formulation: With patient Time For Goal Achievement: 05/20/20 Potential to Achieve Goals: Good ADL Goals Pt Will Perform Lower Body Dressing: sit to/from stand;with modified independence;with adaptive equipment Pt Will Transfer to Toilet: with modified independence;ambulating (AE as needed) Additional ADL Goal #1: Pt will demo independence with learned strategies/precautions to maximize safety/skin integrity to wound  OT Frequency: Min 2X/week   Barriers to D/C:            Co-evaluation              AM-PAC OT "6 Clicks" Daily Activity     Outcome Measure Help from another person eating meals?: None Help from another person taking care of personal grooming?: None Help from another person toileting, which includes using toliet, bedpan, or urinal?: A Little Help from another person bathing (including washing, rinsing, drying)?: A Little Help from another person to put on and taking off regular upper body clothing?: None Help from another person to put on and taking off regular lower body clothing?: A Little 6 Click Score: 21   End of Session Nurse Communication:  Other (comment) (wound vac pressure issues)  Activity Tolerance: Patient tolerated treatment well Patient left: in bed;with call bell/phone within reach;with bed alarm set;Other (comment) (wound vac in place)  OT Visit Diagnosis: Other abnormalities of gait and mobility (R26.89);Muscle weakness (generalized) (M62.81)                Time: 4098-1191 OT Time Calculation (min): 26 min Charges:  OT General Charges $OT Visit: 1 Visit OT Evaluation $OT Eval Moderate Complexity: 1 Mod OT Treatments $Self Care/Home Management : 8-22 mins  Richrd Prime, MPH, MS, OTR/L ascom [phone] 05/06/20, 3:21 PM

## 2020-05-07 ENCOUNTER — Encounter: Payer: Self-pay | Admitting: Surgery

## 2020-05-07 DIAGNOSIS — E1165 Type 2 diabetes mellitus with hyperglycemia: Secondary | ICD-10-CM

## 2020-05-07 DIAGNOSIS — I214 Non-ST elevation (NSTEMI) myocardial infarction: Secondary | ICD-10-CM

## 2020-05-07 DIAGNOSIS — M726 Necrotizing fasciitis: Secondary | ICD-10-CM

## 2020-05-07 DIAGNOSIS — E1169 Type 2 diabetes mellitus with other specified complication: Secondary | ICD-10-CM

## 2020-05-07 DIAGNOSIS — I252 Old myocardial infarction: Secondary | ICD-10-CM

## 2020-05-07 DIAGNOSIS — E118 Type 2 diabetes mellitus with unspecified complications: Secondary | ICD-10-CM

## 2020-05-07 DIAGNOSIS — Z794 Long term (current) use of insulin: Secondary | ICD-10-CM

## 2020-05-07 DIAGNOSIS — E66813 Obesity, class 3: Secondary | ICD-10-CM

## 2020-05-07 LAB — GLUCOSE, CAPILLARY
Glucose-Capillary: 115 mg/dL — ABNORMAL HIGH (ref 70–99)
Glucose-Capillary: 183 mg/dL — ABNORMAL HIGH (ref 70–99)
Glucose-Capillary: 180 mg/dL — ABNORMAL HIGH (ref 70–99)
Glucose-Capillary: 194 mg/dL — ABNORMAL HIGH (ref 70–99)

## 2020-05-07 NOTE — TOC Progression Note (Signed)
Transition of Care [facility]) - Progression Note    Patient Details  Name: Michelle Stark MRN: [id number] Date of Birth: 05-Oct-1955  [facility]) CM/SW Contact  Chapman Fitch, RN Phone Number: 05/07/2020, 3:56 PM  Clinical Narrative:    Benefit check submitted for insulin  TOC contacted 3 PCP offices that are in network with her insurance.  The soonest availability is 12/28.  Appoint made for 10:40 information added to AVS.   PT no recommending SNF.  Patient does not have home health coverage.  TOC will contact insurance tomorrow to determine if she has SNF benefits   Expected Discharge Plan: Home w Home Health Services    Expected Discharge Plan and Services Expected Discharge Plan: Home w Home Health Services   Discharge Planning Services: CM Consult   Living arrangements for the past 2 months: Single Family Home                                       Social Determinants of Health (SDOH) Interventions    Readmission Risk Interventions No flowsheet data found.

## 2020-05-07 NOTE — Anesthesia Postprocedure Evaluation (Signed)
Anesthesia Post Note  Patient: Michelle Stark  Procedure(s) Performed: INCISION AND DRAINAGE PERINEAL ABSCESS (N/A )  Patient location during evaluation: PACU Anesthesia Type: General Level of consciousness: awake and alert Pain management: pain level controlled Vital Signs Assessment: post-procedure vital signs reviewed and stable Respiratory status: spontaneous breathing, nonlabored ventilation, respiratory function stable and patient connected to nasal cannula oxygen Cardiovascular status: blood pressure returned to baseline and stable Postop Assessment: no apparent nausea or vomiting Anesthetic complications: no   No complications documented.   Last Vitals:  Vitals:   05/07/20 1203 05/07/20 1540  BP: (!) 137/59 (!) 116/57  Pulse: 80 69  Resp: 16 16  Temp: 37 C 36.8 C  SpO2: 96% 94%    Last Pain:  Vitals:   05/07/20 1540  TempSrc: Oral  PainSc:                  Lenard Simmer

## 2020-05-07 NOTE — Plan of Care (Signed)
Pt AXOX4. Calma nd cooperative and able to voice her needs. Pt on IV Abx. Wound vac to peri anal area at 125 mhg with scant output. Several leak and/or obstructions alert noted. Foley catheter in place with clear yellow urine. Safety measures in place. Will continue to monitor.  Problem: Education: Goal: Knowledge of General Education information will improve Description: Including pain rating scale, medication(s)/side effects and non-pharmacologic comfort measures Outcome: Progressing   Problem: Health Behavior/Discharge Planning: Goal: Ability to manage health-related needs will improve Outcome: Progressing   Problem: Clinical Measurements: Goal: Ability to maintain clinical measurements within normal limits will improve Outcome: Progressing Goal: Will remain free from infection Outcome: Progressing Goal: Diagnostic test results will improve Outcome: Progressing Goal: Respiratory complications will improve Outcome: Progressing Goal: Cardiovascular complication will be avoided Outcome: Progressing   Problem: Activity: Goal: Risk for activity intolerance will decrease Outcome: Progressing   Problem: Nutrition: Goal: Adequate nutrition will be maintained Outcome: Progressing   Problem: Coping: Goal: Level of anxiety will decrease Outcome: Progressing   Problem: Elimination: Goal: Will not experience complications related to bowel motility Outcome: Progressing Goal: Will not experience complications related to urinary retention Outcome: Progressing   Problem: Pain Managment: Goal: General experience of comfort will improve Outcome: Progressing   Problem: Safety: Goal: Ability to remain free from injury will improve Outcome: Progressing   Problem: Skin Integrity: Goal: Risk for impaired skin integrity will decrease Outcome: Progressing

## 2020-05-07 NOTE — Progress Notes (Signed)
Wound vac not able to create good suction at site. Area very moist and tape keeps coming off around wound. Tape was reapplied to hold suction but because of area and how moist tape will not stay. Machine keeps beeping and aggravating patient and patient cannot rest.  Dr. Everlene Farrier was notified and states ok to turn off machine. MD aware.    Luvenia Starch, RN

## 2020-05-07 NOTE — TOC Benefit Eligibility Note (Signed)
Transition of Care [facility]) Benefit Eligibility Note    Patient Details  Name: Michelle Stark MRN: [id number] Date of Birth: [birth year]   Medication/Dose: LANTUS  40 UNITS SQ NIGHTLY    and    NOVOLOG  5 UNITS TID WITH MEALS           Spoke with Person/Company/Phone Number:: SELF PAY           Additional Notes: NO  PHARMACY  BENEFITS ON Ellery Plunk Phone Number: 05/07/2020, 4:24 PM

## 2020-05-07 NOTE — Consult Note (Signed)
NPWT Dced bedside nurses to manage wound care along with general surgery.   WOC will sign off.  Husam Hohn [facility], CNS, [employer] [phone]

## 2020-05-07 NOTE — Progress Notes (Addendum)
Inpatient Diabetes Program Recommendations  AACE/ADA: New Consensus Statement on Inpatient Glycemic Control (2015)  Target Ranges:  Prepandial:   less than 140 mg/dL      Peak postprandial:   less than 180 mg/dL (1-2 hours)      Critically ill patients:  140 - 180 mg/dL   Lab Results  Component Value Date   GLUCAP 183 (H) 05/07/2020   HGBA1C 12.4 (H) 04/30/2020    Review of Glycemic Control Results for Michelle Stark, Michelle Stark (MRN [id number]) as of 05/07/2020 13:41  Ref. Range 05/06/2020 11:39 05/06/2020 15:59 05/06/2020 21:11 05/07/2020 07:41 05/07/2020 12:00  Glucose-Capillary Latest Ref Range: 70 - 99 mg/dL 786 (H) 767 (H) 209 (H) 115 (H) 183 (H)   Diabetes history:  New diagnosis of DM  Current orders for Inpatient glycemic control:  Lantus 40 units daily Novolog sensitive tid with meals and HS Novolog 5 units tid with tid with meals  Inpatient Diabetes Program Recommendations:    Visited with patient and patient's son Toney Sang).  Discussed new diagnosis of DM.  She states that her mother, grandmother and many other family members have/had DM as well.  Reviewed need for insulin at d/c.  Showed patient and son how to use insulin pen (patients other son was taught last week).  Reviewed all steps if insulin pen including attachment of needle, 2-unit air shot, dialing up dose, giving injection, removing needle, disposal of sharps, storage of unused insulin, disposal of insulin etc.  Also reminded patient to begin practicing insulin administration while in the hospital.   Reviewed monitoring and importance of keeping blood sugars normal (80-130 mg/dL fasting and less than 470 mg/dL post-prandial) for wound healing.    -May be able to just be on basal/oral medications at d/c based on CBG levels??  Patient states that she does not have PCP and will need PCP at d/c.    At d/c patient will need rx. For:  -Lantus solostar pen [phone]) -Insulin pen needles [phone]) -Glucose meter  [phone])  Requested benefits check for medications to make sure that insulins are affordable at d/c as well.   Thanks,  Beryl Meager, RN, BC-ADM Inpatient Diabetes Coordinator Pager [phone] (8a-5p)

## 2020-05-07 NOTE — Progress Notes (Signed)
Patient ID: Michelle Stark, female   DOB: 13-Jan-1956, 64 y.o.   MRN: [id number] [facility] PROGRESS NOTE  KATLEN SEYER YOV:[id number] DOB: 1955/10/23 DOA: 04/29/2020 PCP: Patient, No Pcp Per  HPI/Subjective: Patient stated that she has not walked that much with physical therapy.  Patient states that something is always happened while she is trying to get up.  She does feel weak.  She has had 3 debridements for necrotizing fasciitis.  Objective: Vitals:   05/07/20 1203 05/07/20 1540  BP: (!) 137/59 (!) 116/57  Pulse: 80 69  Resp: 16 16  Temp: 98.6 F (37 C) 98.3 F (36.8 C)  SpO2: 96% 94%    Intake/Output Summary (Last 24 hours) at 05/07/2020 1655 Last data filed at 05/07/2020 1438 Gross per 24 hour  Intake 589.45 ml  Output 1600 ml  Net -1010.55 ml   Filed Weights   04/29/20 1418 04/30/20 0828 05/05/20 1143  Weight: 82.6 kg 103 kg 103 kg    ROS: Review of Systems  Respiratory: Negative for cough and shortness of breath.   Cardiovascular: Negative for chest pain.  Gastrointestinal: Negative for abdominal pain, nausea and vomiting.   Exam: Physical Exam HENT:     Head: Normocephalic.     Mouth/Throat:     Pharynx: No oropharyngeal exudate.  Eyes:     General: Lids are normal.     Conjunctiva/sclera: Conjunctivae normal.     Pupils: Pupils are equal, round, and reactive to light.  Cardiovascular:     Rate and Rhythm: Normal rate and regular rhythm.     Heart sounds: Normal heart sounds, S1 normal and S2 normal.  Pulmonary:     Effort: Pulmonary effort is normal.     Breath sounds: No decreased breath sounds, wheezing, rhonchi or rales.  Abdominal:     Palpations: Abdomen is soft.     Tenderness: There is no abdominal tenderness.  Musculoskeletal:     Right lower leg: No swelling.     Left lower leg: No swelling.  Skin:    General: Skin is warm.     Comments: Buttock wound covered.  Neurological:     Mental Status: She is alert and oriented  to person, place, and time.       Data Reviewed: Basic Metabolic Panel: Recent Labs  Lab 05/02/20 0626 05/03/20 0634 05/04/20 0520 05/05/20 0431 05/06/20 0557  NA 143 142 140 140 135  K 3.6 3.8 3.4* 3.8 4.3  CL 116* 110 106 105 100  CO2 20* 22 24 26 26   GLUCOSE 180* 277* 159* 145* 254*  BUN 21 18 13 11 12   CREATININE 0.65 0.65 0.44 0.51 0.57  CALCIUM 8.1* 8.3* 7.9* 7.8* 8.1*  MG 1.8 1.9 1.8 1.8 1.9  PHOS <1.0* 1.6* 2.7 2.9  --    CBC: Recent Labs  Lab 05/02/20 0626 05/03/20 0634 05/04/20 0520 05/05/20 0431 05/06/20 0557  WBC 9.4 11.2* 9.9 10.0 11.6*  NEUTROABS 6.9 8.3* 6.6 6.2 8.6*  HGB 11.0* 11.1* 11.0* 11.2* 11.5*  HCT 30.9* 32.6* 32.6* 33.7* 34.7*  MCV 87.5 91.6 92.4 93.4 93.3  PLT 260 251 243 267 310    CBG: Recent Labs  Lab 05/06/20 1139 05/06/20 1559 05/06/20 2111 05/07/20 0741 05/07/20 1200  GLUCAP 236* 195* 207* 115* 183*    Recent Results (from the past 240 hour(s))  Respiratory Panel by RT PCR (Flu A&B, Covid) - Nasopharyngeal Swab     Status: None   Collection Time: 04/29/20  3:05 PM  Specimen: Nasopharyngeal Swab  Result Value Ref Range Status   SARS Coronavirus 2 by RT PCR NEGATIVE NEGATIVE Final    Comment: (NOTE) SARS-CoV-2 target nucleic acids are NOT DETECTED.  The SARS-CoV-2 RNA is generally detectable in upper respiratoy specimens during the acute phase of infection. The lowest concentration of SARS-CoV-2 viral copies this assay can detect is 131 copies/mL. A negative result does not preclude SARS-Cov-2 infection and should not be used as the sole basis for treatment or other patient management decisions. A negative result may occur with  improper specimen collection/handling, submission of specimen other than nasopharyngeal swab, presence of viral mutation(s) within the areas targeted by this assay, and inadequate number of viral copies (<131 copies/mL). A negative result must be combined with clinical observations, patient  history, and epidemiological information. The expected result is Negative.  Fact Sheet for Patients:  https://www.moore.com/  Fact Sheet for Healthcare Providers:  https://www.young.biz/  This test is no t yet approved or cleared by the [place] FDA and  has been authorized for detection and/or diagnosis of SARS-CoV-2 by FDA under an Emergency Use Authorization (EUA). This EUA will remain  in effect (meaning this test can be used) for the duration of the COVID-19 declaration under Section 564(b)(1) of the Act, 21 U.S.C. section 360bbb-3(b)(1), unless the authorization is terminated or revoked sooner.     Influenza A by PCR NEGATIVE NEGATIVE Final   Influenza B by PCR NEGATIVE NEGATIVE Final    Comment: (NOTE) The Xpert Xpress SARS-CoV-2/FLU/RSV assay is intended as an aid in  the diagnosis of influenza from Nasopharyngeal swab specimens and  should not be used as a sole basis for treatment. Nasal washings and  aspirates are unacceptable for Xpert Xpress SARS-CoV-2/FLU/RSV  testing.  Fact Sheet for Patients: https://www.moore.com/  Fact Sheet for Healthcare Providers: https://www.young.biz/  This test is not yet approved or cleared by the [place] FDA and  has been authorized for detection and/or diagnosis of SARS-CoV-2 by  FDA under an Emergency Use Authorization (EUA). This EUA will remain  in effect (meaning this test can be used) for the duration of the  Covid-19 declaration under Section 564(b)(1) of the Act, 21  U.S.C. section 360bbb-3(b)(1), unless the authorization is  terminated or revoked. Performed at [facility], [address] [place], [place] [address]   Blood culture (routine x 2)     Status: None   Collection Time: 04/29/20  3:39 PM   Specimen: BLOOD  Result Value Ref Range Status   Specimen Description BLOOD BLOOD LEFT FOREARM  Final   Special Requests    Final    BOTTLES DRAWN AEROBIC AND ANAEROBIC Blood Culture adequate volume   Culture   Final    NO GROWTH 5 DAYS Performed at [facility], [address] [place], [place] [address]    Report Status 05/04/2020 FINAL  Final  Blood culture (routine x 2)     Status: None   Collection Time: 04/29/20  3:44 PM   Specimen: BLOOD  Result Value Ref Range Status   Specimen Description BLOOD BLOOD RIGHT FOREARM  Final   Special Requests   Final    BOTTLES DRAWN AEROBIC AND ANAEROBIC Blood Culture adequate volume   Culture   Final    NO GROWTH 5 DAYS Performed at [facility], [address] [place], [place] [address]    Report Status 05/04/2020 FINAL  Final  Urine culture     Status: Abnormal   Collection Time:  04/29/20  9:00 PM   Specimen: Urine, Catheterized  Result Value Ref Range Status   Specimen Description   Final    URINE, CATHETERIZED Performed at [facility], [address], [place], [place] [address]    Special Requests   Final    NONE Performed at [facility], [address] [place], [place] [address]    Culture MULTIPLE SPECIES PRESENT, SUGGEST RECOLLECTION (A)  Final   Report Status 05/01/2020 FINAL  Final  Aerobic/Anaerobic Culture (surgical/deep wound)     Status: None   Collection Time: 04/30/20  2:35 AM   Specimen: PATH Other; Wound  Result Value Ref Range Status   Specimen Description   Final    PERIRECTAL ABSCESS Performed at [facility], [address] [place], [place] [address]    Special Requests   Final    NONE Performed at [facility], [address] [place], [place] [address]    Gram Stain   Final    ABUNDANT WBC PRESENT, PREDOMINANTLY PMN ABUNDANT GRAM POSITIVE COCCI ABUNDANT GRAM NEGATIVE RODS ABUNDANT GRAM NEGATIVE COCCI    Culture   Final    ABUNDANT ESCHERICHIA COLI ABUNDANT GROUP B STREP(S.AGALACTIAE)ISOLATED TESTING AGAINST S. AGALACTIAE NOT ROUTINELY PERFORMED DUE TO  PREDICTABILITY OF AMP/PEN/VAN SUSCEPTIBILITY. ABUNDANT BACTEROIDES FRAGILIS MODERATE PREVOTELLA SPECIES BETA LACTAMASE POSITIVE Performed at [facility], [address], [place], [place] [address]    Report Status 05/03/2020 FINAL  Final   Organism ID, Bacteria ESCHERICHIA COLI  Final      Susceptibility   Escherichia coli - MIC*    AMPICILLIN >=32 RESISTANT Resistant     CEFAZOLIN <=4 SENSITIVE Sensitive     CEFEPIME <=0.12 SENSITIVE Sensitive     CEFTAZIDIME <=1 SENSITIVE Sensitive     CEFTRIAXONE <=0.25 SENSITIVE Sensitive     CIPROFLOXACIN 0.5 SENSITIVE Sensitive     GENTAMICIN <=1 SENSITIVE Sensitive     IMIPENEM 0.5 SENSITIVE Sensitive     TRIMETH/SULFA <=20 SENSITIVE Sensitive     AMPICILLIN/SULBACTAM 16 INTERMEDIATE Intermediate     PIP/TAZO <=4 SENSITIVE Sensitive     * ABUNDANT ESCHERICHIA COLI  Aerobic/Anaerobic Culture (surgical/deep wound)     Status: None   Collection Time: 04/30/20  2:35 AM   Specimen: PATH Other; Wound  Result Value Ref Range Status   Specimen Description   Final    PERIRECTAL ABSCESS Performed at [facility], [address] [place], [place] [address]    Special Requests   Final    NONE Performed at [facility], [address] [place], [place] [address]    Gram Stain   Final    MODERATE WBC PRESENT, PREDOMINANTLY PMN ABUNDANT GRAM NEGATIVE RODS ABUNDANT GRAM POSITIVE COCCI ABUNDANT GRAM NEGATIVE COCCI    Culture   Final    ABUNDANT ESCHERICHIA COLI ABUNDANT GROUP B STREP(S.AGALACTIAE)ISOLATED TESTING AGAINST S. AGALACTIAE NOT ROUTINELY PERFORMED DUE TO PREDICTABILITY OF AMP/PEN/VAN SUSCEPTIBILITY. ABUNDANT BACTEROIDES FRAGILIS FEW PREVOTELLA SPECIES BETA LACTAMASE POSITIVE Performed at [facility], [address], [place], [place] [address]    Report Status 05/03/2020 FINAL  Final   Organism ID, Bacteria ESCHERICHIA COLI  Final      Susceptibility   Escherichia coli - MIC*    AMPICILLIN >=32  RESISTANT Resistant     CEFAZOLIN <=4 SENSITIVE Sensitive     CEFEPIME <=0.12 SENSITIVE Sensitive     CEFTAZIDIME <=1 SENSITIVE Sensitive     CEFTRIAXONE <=0.25 SENSITIVE Sensitive  CIPROFLOXACIN <=0.25 SENSITIVE Sensitive     GENTAMICIN <=1 SENSITIVE Sensitive     IMIPENEM <=0.25 SENSITIVE Sensitive     TRIMETH/SULFA <=20 SENSITIVE Sensitive     AMPICILLIN/SULBACTAM 16 INTERMEDIATE Intermediate     PIP/TAZO <=4 SENSITIVE Sensitive     * ABUNDANT ESCHERICHIA COLI  Aerobic/Anaerobic Culture (surgical/deep wound)     Status: None   Collection Time: 04/30/20  2:35 AM   Specimen: PATH Other; Tissue  Result Value Ref Range Status   Specimen Description   Final    PERIRECTAL ABSCESS Performed at [facility], [address], [place], [place] [address]    Special Requests   Final    NONE Performed at [facility], [address] [place], [place] [address]    Gram Stain   Final    FEW WBC PRESENT, PREDOMINANTLY PMN ABUNDANT GRAM POSITIVE COCCI ABUNDANT GRAM NEGATIVE RODS    Culture   Final    FEW ESCHERICHIA COLI ABUNDANT GROUP B STREP(S.AGALACTIAE)ISOLATED TESTING AGAINST S. AGALACTIAE NOT ROUTINELY PERFORMED DUE TO PREDICTABILITY OF AMP/PEN/VAN SUSCEPTIBILITY. ABUNDANT BACTEROIDES FRAGILIS MODERATE PREVOTELLA SPECIES BETA LACTAMASE POSITIVE Performed at [facility], [address], [place], [place] [address]    Report Status 05/03/2020 FINAL  Final   Organism ID, Bacteria ESCHERICHIA COLI  Final      Susceptibility   Escherichia coli - MIC*    AMPICILLIN >=32 RESISTANT Resistant     CEFAZOLIN <=4 SENSITIVE Sensitive     CEFEPIME <=0.12 SENSITIVE Sensitive     CEFTAZIDIME <=1 SENSITIVE Sensitive     CEFTRIAXONE <=0.25 SENSITIVE Sensitive     CIPROFLOXACIN <=0.25 SENSITIVE Sensitive     GENTAMICIN <=1 SENSITIVE Sensitive     IMIPENEM 0.5 SENSITIVE Sensitive     TRIMETH/SULFA <=20 SENSITIVE Sensitive     AMPICILLIN/SULBACTAM 16 INTERMEDIATE  Intermediate     PIP/TAZO <=4 SENSITIVE Sensitive     * FEW ESCHERICHIA COLI  MRSA PCR Screening     Status: None   Collection Time: 04/30/20  8:27 AM   Specimen: Nasal Mucosa; Nasopharyngeal  Result Value Ref Range Status   MRSA by PCR NEGATIVE NEGATIVE Final    Comment:        The GeneXpert MRSA Assay (FDA approved for NASAL specimens only), is one component of a comprehensive MRSA colonization surveillance program. It is not intended to diagnose MRSA infection nor to guide or monitor treatment for MRSA infections. Performed at [facility], [address] [place], [place] [address]   Aerobic Culture (superficial specimen)     Status: None   Collection Time: 04/30/20  9:44 PM   Specimen: Leg  Result Value Ref Range Status   Specimen Description   Final    LEG Performed at [facility], [address], [place], [place] [address]    Special Requests   Final    NONE Performed at [facility], [address] [place], [place] [address]    Gram Stain   Final    NO WBC SEEN MODERATE GRAM POSITIVE COCCI RARE YEAST Performed at [facility], [address], [place], [place] [address]    Culture   Final    FEW GROUP B STREP(S.AGALACTIAE)ISOLATED TESTING AGAINST S. AGALACTIAE NOT ROUTINELY PERFORMED DUE TO PREDICTABILITY OF AMP/PEN/VAN SUSCEPTIBILITY. FEW CANDIDA PARAPSILOSIS    Report Status 05/05/2020 FINAL  Final     Studies: No results found.  Scheduled Meds: . aspirin EC  81 mg Oral Daily  .  atorvastatin  20 mg Oral Daily  . Chlorhexidine Gluconate Cloth  6 each Topical Daily  . enoxaparin (LOVENOX) injection  0.5 mg/kg Subcutaneous Q24H  . famotidine  20 mg Oral Daily  . fluconazole  200 mg Oral q1800  . insulin aspart  0-9 Units Subcutaneous TID AC & HS  . insulin aspart  5 Units Subcutaneous TID WC  . insulin glargine  40 Units Subcutaneous Daily  . metoprolol succinate  12.5 mg Oral Daily  . multivitamin with minerals   1 tablet Oral Daily  . Ensure Max Protein  11 oz Oral BID BM  . sodium hypochlorite   Irrigation Once   Continuous Infusions: . albumin human Stopped (04/30/20 0520)  .  ceFAZolin (ANCEF) IV 2 g (05/07/20 1308)  . metronidazole 500 mg (05/07/20 1346)    Assessment/Plan:  1. Clinical sepsis, present on admission with necrotizing fasciitis of the buttock and perianal area.  Patient on metronidazole and cefazolin IV as per infectious disease.  Patient had 3 debridements by general surgery.  They are deciding on whether to change the wound VAC over to wet-to-dry dressings.  E. coli and group B strep growing on a culture. 2. Uncontrolled type 2 diabetes with hyperglycemia and resolved DKA.  Continue Lantus and sliding scale.  Nursing to do diabetic teaching. 3. Hypophosphatemia few days ago.  Replaced into the normal range. 4. NSTEMI in setting of sepsis, DKA and electrolyte abnormalities.  On aspirin, Lipitor and metoprolol. 5. Obesity with a BMI of 41.53.  6. Weakness.  Physical therapy now recommending rehab     Code Status:     Code Status Orders  (From admission, onward)         Start     Ordered   04/30/20 0433  Full code  Continuous        04/30/20 0433        Code Status History    This patient has a current code status but no historical code status.   Advance Care Planning Activity     Family Communication: Spoke with son on the phone Disposition Plan: Status is: Inpatient  Dispo: The patient is from: Home              Anticipated d/c is to: Potential rehab              Anticipated d/c date is: Potentially 05/09/2020              Patient currently being treated with IV antibiotics for necrotizing fasciitis  Consultants:  General surgery  Infectious disease  Antibiotics:  Cefazolin  Metronidazole  Time spent: 28 minutes  [employer]ancy [employer]  [facility]

## 2020-05-07 NOTE — Progress Notes (Signed)
[facility] SURGICAL PROGRESS NOTE  Hospital Day(s): 7.   Post op day(s): 2 Days Post-Op.   Interval History:  Patient seen and examined. Overnight wound vac lost seal Patient reports, she is doing okay this morning, no major complaints.  No new labs this morning Plan to transition to bedside wet-to-dry dressing today, I do not think vac will hold seal in this area She is having frequent stooling in this area.  Cx from debridement are polymicrobial; E. coli, GB Strep, Anaerobes; ID following; ABx deescalated to cefazolin + flagyl  Still awaiting disposition plan for home care  Vital signs in last 24 hours: [min-max] current  Temp:  [98.2 F (36.8 C)-98.7 F (37.1 C)] 98.3 F (36.8 C) (10/27 0736) Pulse Rate:  [64-75] 72 (10/27 0736) Resp:  [18] 18 (10/27 0736) BP: (108-137)/(46-72) 128/61 (10/27 0736) SpO2:  [95 %-97 %] 95 % (10/27 0736)     Height: 5\' 2"  (157.5 cm) Weight: 103 kg BMI (Calculated): 41.52   Intake/Output last 2 shifts:  10/26 0701 - 10/27 0700 In: 829.5 [P.O.:600; IV Piggyback:229.5] Out: 1600 [Urine:1600]   Physical Exam:  Constitutional: alert, cooperative and no distress  Respiratory: breathing non-labored at rest  Cardiovascular: regular rate and sinus rhythm  Genitourinary: Foley in place Integumentary: 8 cm in depth, 6 cm in length and 4 cm in width wound to the right perineum/buttock. Wound bed has some contamination with stool, no further necrosis or purulence. She was actively stooling during dressing change  Labs:  CBC Latest Ref Rng & Units 05/06/2020 05/05/2020 05/04/2020  WBC 4.0 - 10.5 K/uL 11.6(H) 10.0 9.9  Hemoglobin 12.0 - 15.0 g/dL 11.5(L) 11.2(L) 11.0(L)  Hematocrit 36 - 46 % 34.7(L) 33.7(L) 32.6(L)  Platelets 150 - 400 K/uL 310 267 243   CMP Latest Ref Rng & Units 05/06/2020 05/05/2020 05/04/2020  Glucose 70 - 99 mg/dL 05/06/2020) 784(O) 962(X)  BUN 8 - 23 mg/dL 12 11 13   Creatinine 0.44 - 1.00 mg/dL 528(U 1.32   Sodium 135 - 145 mmol/L 135 140 140  Potassium 3.5 - 5.1 mmol/L 4.3 3.8 3.4(L)  Chloride 98 - 111 mmol/L 100 105 106  CO2 22 - 32 mmol/L 26 26 24   Calcium 8.9 - 10.3 mg/dL 8.1(L) 7.8(L) 7.9(L)  Total Protein 6.5 - 8.1 g/dL - - -  Total Bilirubin 0.3 - 1.2 mg/dL - - -  Alkaline Phos 38 - 126 U/L - - -  AST 15 - 41 U/L - - -  ALT 0 - 44 U/L - - -     Imaging studies: No new pertinent imaging studies   Assessment/Plan:  64 y.o. female 2 Days Post-Op s/p third takeback excisional debridement and wound vac placement for necrotizing soft tissue infection of the perineum   - Transition to wet-to-dry dressing changes daily + prn; this area will not accommodate wound vac appropriately. CSW is working on safe pan for home care, and we will likely require family assistance at home   - Continue IV Abx; deescalated to Cefazolin + Flagyl with ID yesterday             - Pain control prn              - Mobilization encouraged             - Further management per primary service; we will follow        - Discharge Planning: Awaiting disposition plan and home health plan for dressing changes  All of the above findings and recommendations were discussed with the patient, and the medical team, and all of patient's questions were answered to her expressed satisfaction.  -- Lynden Oxford, PA-C [facility] 05/07/2020, 8:18 AM [phone] M-F: 7am - 4pm

## 2020-05-07 NOTE — Progress Notes (Signed)
Physical Therapy Treatment Patient Details Name: Michelle Stark MRN: [id number] DOB: 1956-07-11 Today's Date: 05/07/2020    History of Present Illness Patient is a 64 year old female who presented to Ed for nausea vominting, increased thirst and fatigue, was found to be in severe metabolic acidosis. Was found to have a sacral wound with surgical I and D performed on 10/20, 10/22 adiditional surgery for ID, 10/25 third debridement .  with PMH of HTN, obesity, HLD.    PT Comments    Patient alert, agreeable to PT, endorsed generalized body soreness. Pt eager to attempt further mobility this session, but was limited with mobility due to fatigue and several bouts of BM. Pt able to sit <> stand with RW and CGA-minA three times and able to stand with significant trunk flexion ea attempt to allow for pericare, totalA needed from RN. Pt extremely fatigued for last rep, minA to stand and maintain standing. Returned to supine with minAx2 for safety and rolled several times in bed. The patient continued to demonstrate excellent motivation, but due to decrease in functional mobility and current level of assistance needed, discharge plans updated to SNF to improve safety.     Follow Up Recommendations  SNF     Equipment Recommendations  3in1 (PT);Other (comment) (has access to RW and WC if needed)    Recommendations for Other Services OT consult     Precautions / Restrictions      Mobility  Bed Mobility                  Transfers                    Ambulation/Gait                 Stairs             Wheelchair Mobility    Modified Rankin (Stroke Patients Only)       Balance Overall balance assessment: Needs assistance Sitting-balance support: Feet supported;Bilateral upper extremity supported Sitting balance-Leahy Scale: Fair       Standing balance-Leahy Scale: Fair Standing balance comment: reliant on UE support due to leg fatigue                             Cognition                                              Exercises Other Exercises Other Exercises: Pt able to sti <> stand with RW and CGA-minA three times and able to stand with significant trunk flexion ea attempt to allow for pericare for loose BM. Pt extremely fatigued for last rep, minA to stand and maintain standing.    General Comments        Pertinent Vitals/Pain      Home Living                      Prior Function            PT Goals (current goals can now be found in the care plan section) Progress towards PT goals: Progressing toward goals    Frequency    Min 2X/week      PT Plan Discharge plan needs to be updated    Co-evaluation  AM-PAC PT "6 Clicks" Mobility   Outcome Measure  Help needed turning from your back to your side while in a flat bed without using bedrails?: A Little Help needed moving from lying on your back to sitting on the side of a flat bed without using bedrails?: A Little Help needed moving to and from a bed to a chair (including a wheelchair)?: A Little Help needed standing up from a chair using your arms (e.g., wheelchair or bedside chair)?: A Little Help needed to walk in hospital room?: A Little Help needed climbing 3-5 steps with a railing? : A Lot 6 Click Score: 17    End of Session Equipment Utilized During Treatment: Gait belt Activity Tolerance: Patient tolerated treatment well Patient left: in bed;with call bell/phone within reach;with bed alarm set;with nursing/sitter in room Nurse Communication: Mobility status PT Visit Diagnosis: Muscle weakness (generalized) (M62.81);Other abnormalities of gait and mobility (R26.89);Difficulty in walking, not elsewhere classified (R26.2)     Time: 0539-7673 PT Time Calculation (min) (ACUTE ONLY): 38 min  Charges:  $Therapeutic Exercise: 38-52 mins                    Olga Coaster PT, DPT 2:46  PM,05/07/20

## 2020-05-08 DIAGNOSIS — A401 Sepsis due to streptococcus, group B: Principal | ICD-10-CM

## 2020-05-08 LAB — GLUCOSE, CAPILLARY
Glucose-Capillary: 122 mg/dL — ABNORMAL HIGH (ref 70–99)
Glucose-Capillary: 152 mg/dL — ABNORMAL HIGH (ref 70–99)
Glucose-Capillary: 178 mg/dL — ABNORMAL HIGH (ref 70–99)
Glucose-Capillary: 216 mg/dL — ABNORMAL HIGH (ref 70–99)
Glucose-Capillary: 208 mg/dL — ABNORMAL HIGH (ref 70–99)

## 2020-05-08 MED ORDER — ONDANSETRON HCL 4 MG/2ML IJ SOLN
4.0000 mg | Freq: Four times a day (QID) | INTRAMUSCULAR | Status: DC | PRN
  Administered 2020-05-08: 4 mg via INTRAVENOUS
  Filled 2020-05-08 (×2): qty 2

## 2020-05-08 MED ORDER — JUVEN PO PACK
1.0000 | PACK | Freq: Two times a day (BID) | ORAL | Status: DC
  Administered 2020-05-08 – 2020-05-11 (×5): 1 via ORAL

## 2020-05-08 MED ORDER — OXYBUTYNIN CHLORIDE 5 MG PO TABS: 5.0000 mg | ORAL_TABLET | Freq: Three times a day (TID) | ORAL | Status: DC | PRN

## 2020-05-08 MED ORDER — INSULIN ASPART PROT & ASPART (70-30 MIX) 100 UNIT/ML ~~LOC~~ SUSP
28.0000 [IU] | Freq: Every day | SUBCUTANEOUS | Status: DC
  Administered 2020-05-08 – 2020-05-11 (×4): 28 [IU] via SUBCUTANEOUS
  Filled 2020-05-08 (×4): qty 10

## 2020-05-08 NOTE — Progress Notes (Signed)
Physical Therapy Treatment Patient Details Name: Michelle Stark MRN: [id number] DOB: 10/05/1955 Today's Date: 05/08/2020    History of Present Illness Patient is a 64 year old female who presented to Ed for nausea vominting, increased thirst and fatigue, was found to be in severe metabolic acidosis. Was found to have a sacral wound with surgical I and D performed on 10/20, 10/22 adiditional surgery for ID, 10/25 third debridement .  with PMH of HTN, obesity, HLD.    PT Comments    Pt was side lying in bed upon arriving. She is A and O x 4 and agrees to PT with encouragement. Does endorse pin in wound site but overall tolerated session well. She was able to achieve EOB short sit with supervision + use of bed rail. Stood without any assistance to RW and ambulated ~ 25 ft. No LOB or difficulty. Limited by pain/fatigue and unwillingness to ambulate into hallway due to needing to wear a mask. Returned to side lying in bed  Post session. Lengthy discussion with CM/pt/pt;s son about DC disposition and plan going forward. Per pt's son, she will have several family members checking on her and assisting her at home. Pt's son reports having an Charity fundraiser and PT in family that be available to come in for wound training. Therapist recommend DC home with [facility] services if able. Per CM, pt does not have SNF benefits and unable to receive [facility] services. Acute PT will continue to follow per POC and progress as able per pt tolerance. Wound care training will need to be performed to family prior to DC.    Follow Up Recommendations  Home health PT;Supervision - Intermittent     Equipment Recommendations  Rolling walker with 5" wheels;3in1 (PT)    Recommendations for Other Services       Precautions / Restrictions Precautions Precautions: Fall Precaution Comments:  (wound) Restrictions Weight Bearing Restrictions: No    Mobility  Bed Mobility Overal bed mobility: Needs Assistance Bed Mobility: Rolling;Sidelying  to Sit;Sit to Sidelying;Sit to Supine Rolling: Supervision Sidelying to sit: Supervision   Sit to supine: Supervision Sit to sidelying: Supervision;HOB elevated General bed mobility comments: Pt was able to perform bed mobility without physical assistance however did use bedrails. will need someone to assist her at home at DC. discussed this with son via phone  Transfers Overall transfer level: Modified independent Equipment used: Rolling walker (2 wheeled) Transfers: Sit to/from Stand Sit to Stand: Modified independent (Device/Increase time)         General transfer comment: pt easily able to stand from EOB and from standard height chair without assistance  Ambulation/Gait Ambulation/Gait assistance: Supervision Gait Distance (Feet): 25 Feet Assistive device: Rolling walker (2 wheeled) Gait Pattern/deviations: Step-through pattern;Trunk flexed Gait velocity: decreased   General Gait Details: pt demonstrated safe steady gait kinematics but limited distance 2/2 to pt unwilling to leave room and endorses fatigue/pain. no balance concerns        Balance Overall balance assessment: Needs assistance Sitting-balance support: Feet supported;Bilateral upper extremity supported Sitting balance-Leahy Scale: Good Sitting balance - Comments: no LOB with feet support only   Standing balance support: During functional activity;Bilateral upper extremity supported Standing balance-Leahy Scale: Good Standing balance comment: pt let go of RW several times to adjust gown and move line/tubes without LOB. did rely on BUE support on RW during dynamic actrivityfor safety       Cognition Arousal/Alertness: Awake/alert Behavior During Therapy: WFL for tasks assessed/performed Overall Cognitive Status: Within Functional Limits  for tasks assessed    General Comments: Pt was A and O x 4 and agreeable to OOB activity             Pertinent Vitals/Pain Pain Assessment: 0-10 Pain Score: 5   Faces Pain Scale: Hurts little more Pain Location: sacral area Pain Descriptors / Indicators: Grimacing;Aching;Guarding Pain Intervention(s): Limited activity within patient's tolerance;Monitored during session;Premedicated before session;Repositioned           PT Goals (current goals can now be found in the care plan section) Acute Rehab PT Goals Patient Stated Goal: get better and go home Progress towards PT goals: Progressing toward goals    Frequency    Min 2X/week      PT Plan Discharge plan needs to be updated       AM-PAC PT "6 Clicks" Mobility   Outcome Measure  Help needed turning from your back to your side while in a flat bed without using bedrails?: A Little Help needed moving from lying on your back to sitting on the side of a flat bed without using bedrails?: A Little Help needed moving to and from a bed to a chair (including a wheelchair)?: A Little Help needed standing up from a chair using your arms (e.g., wheelchair or bedside chair)?: A Little Help needed to walk in hospital room?: A Little Help needed climbing 3-5 steps with a railing? : A Little (pt has ramp) 6 Click Score: 18    End of Session Equipment Utilized During Treatment: Gait belt Activity Tolerance: Patient tolerated treatment well Patient left: in bed;with call bell/phone within reach;with bed alarm set;with nursing/sitter in room Nurse Communication: Mobility status PT Visit Diagnosis: Muscle weakness (generalized) (M62.81);Other abnormalities of gait and mobility (R26.89);Difficulty in walking, not elsewhere classified (R26.2)     Time: 5462-7035 PT Time Calculation (min) (ACUTE ONLY): 39 min  Charges:  $Gait Training: 8-22 mins $Therapeutic Activity: 23-37 mins                     Jetta Lout PTA 05/08/20, 3:35 PM

## 2020-05-08 NOTE — TOC Progression Note (Addendum)
Transition of Care [facility]) - Progression Note    Patient Details  Name: Michelle Stark MRN: [id number] Date of Birth: Oct 16, 1955  Transition of Care [facility]) CM/SW Contact  Beverly Sessions, RN Phone Number: 05/08/2020, 5:17 PM  Clinical Narrative:     Met with Patient and son Audelia Acton at bedside  They are both aware of barriers regarding discharge  - no home health coverage benefits, unable to arrange charity home health services or LOG.  - Outpatient wound care center does not have availability until Jan of 2022  They are aware that patient has new PCP appointment on 07/08/20  PT did better with PT today.  Patient does not feel that any PT services are indicated at discharge.  She is agreeable to RW and BSC at discharge.  Referral made to [facility] with [employer].  Will require MD order  Patient and son are working to coordinate with family that will assist with dressing changes. Jaclyn Shaggy is the patient's niece who is a Marine scientist, and the patient's daughter in law may be able to assist.  The family will discuss tonight to determine who will be helping.  They are aware that everyone who will be assisting with dressing changes will need to be educated prior to discharging from the hospital.  Son Sane to call Katharine Look  With [facility] to notify her when family will come for education.   Bricelyn Surgery PA will arrange for patient to come to the office once a week for wound check and dressing changes.   I also discussed the potential for family to pay out of pocket for nursing visits at home.  This is something the are going to discuss. Per Corene Cornea with [facility] this is something they may could assist with, but would not be available over the weekend  I have provided the patients prescription benefit information to [facility].  She will check the patients benefits for insulin coverage.   I have requested that MD write patient 2 months of prescriptions at discharge until patient gets in with her new  PCP appointment.     Expected Discharge Plan: [place]    Expected Discharge Plan and Services Expected Discharge Plan: [place]   Discharge Planning Services: CM Consult   Living arrangements for the past 2 months: Single Family Home                                       Social Determinants of Health (SDOH) Interventions    Readmission Risk Interventions No flowsheet data found.

## 2020-05-08 NOTE — Progress Notes (Signed)
[facility] SURGICAL PROGRESS NOTE  Hospital Day(s): 8.   Post op day(s): 3 Days Post-Op.   Interval History:  Patient seen and examined no acute events or new complaints overnight.  Patient doing well Transitioned to wet-to-dry dressing yesterday 10/27 She is having frequent stooling in this area.  Cx from debridement are polymicrobial; E. coli, GB Strep, Anaerobes; ID following; ABx deescalated to cefazolin + flagyl  Major issues with disposition are placement, PT now recommending SNF  Vital signs in last 24 hours: [min-max] current  Temp:  [98.1 F (36.7 C)-99.2 F (37.3 C)] 99.2 F (37.3 C) (10/28 0414) Pulse Rate:  [69-88] 77 (10/28 0414) Resp:  [16-20] 18 (10/28 0414) BP: (103-137)/(55-81) 116/55 (10/28 0414) SpO2:  [94 %-96 %] 96 % (10/28 0414)     Height: 5\' 2"  (157.5 cm) Weight: 103 kg BMI (Calculated): 41.52   Intake/Output last 2 shifts:  10/27 0701 - 10/28 0700 In: 600 [P.O.:600] Out: 2150 [Urine:2150]   Physical Exam:  Constitutional: alert, cooperative and no distress  Respiratory: breathing non-labored at rest  Cardiovascular: regular rate and sinus rhythm  Genitourinary: Foley in place Integumentary: 8 cm in depth, 6 cm in length and 4 cm in width wound to the right perineum/buttock. Wound bed with no further necrosis or purulence.   Labs:  CBC Latest Ref Rng & Units 05/06/2020 05/05/2020 05/04/2020  WBC 4.0 - 10.5 K/uL 11.6(H) 10.0 9.9  Hemoglobin 12.0 - 15.0 g/dL 11.5(L) 11.2(L) 11.0(L)  Hematocrit 36 - 46 % 34.7(L) 33.7(L) 32.6(L)  Platelets 150 - 400 K/uL 310 267 243   CMP Latest Ref Rng & Units 05/06/2020 05/05/2020 05/04/2020  Glucose 70 - 99 mg/dL 05/06/2020) 706(C) 376(E)  BUN 8 - 23 mg/dL 12 11 13   Creatinine 0.44 - 1.00 mg/dL 831(D 1.76  Sodium 135 - 145 mmol/L 135 140 140  Potassium 3.5 - 5.1 mmol/L 4.3 3.8 3.4(L)  Chloride 98 - 111 mmol/L 100 105 106  CO2 22 - 32 mmol/L 26 26 24   Calcium 8.9 - 10.3 mg/dL 8.1(L) 7.8(L)  7.9(L)  Total Protein 6.5 - 8.1 g/dL - - -  Total Bilirubin 0.3 - 1.2 mg/dL - - -  Alkaline Phos 38 - 126 U/L - - -  AST 15 - 41 U/L - - -  ALT 0 - 44 U/L - - -    Imaging studies: No new pertinent imaging studies   Assessment/Plan:  64 y.o. 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    - Continue IV Abx; deescalated to Cefazolin + Flagyl with ID yesterday; follow up recommendations for discharge    - Pain control as needed for dressing changes  - Disposition appears to be limiting factor; CSW following   - Further management per primary service    - No further surgical issues, we will sign off but be available. Continue dressing changes as outlined above. I will be happy to help out once a week in my clinic for dressing changes as needed. ABx per ID.   All of the above findings and recommendations were discussed with the patient, and the medical team, and all of patient's questions were answered to her expressed satisfaction.  -- 7.37, PA-C [facility] 05/08/2020, 7:44 AM [phone] M-F: 7am - 4pm

## 2020-05-08 NOTE — Progress Notes (Addendum)
Inpatient Diabetes Program Recommendations  AACE/ADA: New Consensus Statement on Inpatient Glycemic Control   Target Ranges:  Prepandial:   less than 140 mg/dL      Peak postprandial:   less than 180 mg/dL (1-2 hours)      Critically ill patients:  140 - 180 mg/dL  Results for Michelle Stark, Michelle Stark (MRN [id number]) as of 05/08/2020 08:38  Ref. Range 05/07/2020 07:41 05/07/2020 12:00 05/07/2020 16:59 05/07/2020 22:45 05/08/2020 07:55  Glucose-Capillary Latest Ref Range: 70 - 99 mg/dL 115 (H) 183 (H) 180 (H) 194 (H) 122 (H)   Results for Michelle Stark, Michelle Stark (MRN [id number]) as of 05/08/2020 08:38  Ref. Range 04/30/2020 04:57  Hemoglobin A1C Latest Ref Range: 4.8 - 5.6 % 12.4 (H)   Review of Glycemic Control  Diabetes history: NA Outpatient Diabetes medications: NA Current orders for Inpatient glycemic control: Lantus 40 units daily, Novolog 5 units TID with meals for meal coverage, Novolog 0-9 units AC&HS  Inpatient Diabetes Program Recommendations:    Insulin: May want to consider switching to 70/30 insulin as it is more affordable and unclear what type of pharmacy coverage patient has with current insurance plan. If MD is agreeable with switching to 70/30, please discontinue Lantus 40 units daily and Novolog 5 units TID meal coverage and ordering 70/30 28 units BID (dose will provide a total of 39.2 units for basal and 16.8 units for meal coverage per day).   NOTE:  Noted note by D. Barnet Glasgow, TOC on 05/07/20 which notes patient does not have any pharmacy benefits on file. Patient states she has a new job and she has some type of insurance (a shared plan) but she does not know anything about it. Patient reports that her son Stormy Card) has the insurance card and that he could be called to get the information. Also noted progress note on 05/06/20 by S. Bowen, TOC CM which notes that she received insurance card from patient's son and information was updated in Epic. However, still has no primary  insurance listed in chart. Spoke to patient over the phone regarding benefit check as it was noted patient does not have any pharmacy coverage. Patient states that she thought she did have pharmacy coverage but this is fairly new insurance she has and she is unsure what is covered exactly. Discussed potential cost of insulins currently ordered (Lantus and Novolog) and cash price of insulin pens for those insulin. Also discussed that if she does not have pharmacy coverage, it would be more affordable to use Novolin insulins from [facility]. Discussed Novolin NPH, Novolin Regular, and Novolin 70/30 and informed patient that those insulins are $43 per box of 5 insulin pens at [facility]. Patient states that would be affordable for her versus cash price of Lantus and Novolog insulin pens if she had to pay out of pocket for them. Also, informed patient about Reli-On Prime glucometer for $9 and a box of 50 Reli-On test strips for $9 at [facility]. Discussed 70/30 insulin in detail (how it is typically taken with breakfast and supper) in case it is switched. Informed patient that I would let Dr. Leslye Peer know about TOC note and ask about using 70/30 insulin.   Patient verbalized understanding of information discussed and he states that he has no further questions at this time related to diabetes.   RNs to provide ongoing basic DM education at bedside with this patient and engage patient to actively check blood glucose and administer insulin injections. At time of discharge please provide Rx  for glucose monitoring kit (#[id number]), insulin pens, and insulin pen needles (#[id number]).  Thanks, Barnie Alderman, RN, MSN, CDE Diabetes Coordinator Inpatient Diabetes Program [phone] (Team Pager from 8am to 5pm)

## 2020-05-08 NOTE — Progress Notes (Signed)
Pt admitted with Sepsis. Sepsis screen not appropriate at this time.

## 2020-05-08 NOTE — Progress Notes (Signed)
Patient ID: MATHILDE MCWHERTER, female   DOB: 10-29-1955, 64 y.o.   MRN: [id number] [facility] PROGRESS NOTE  CYNTIA STALEY CBJ:[id number] DOB: Jun 27, 1956 DOA: 04/29/2020 PCP: Patient, No Pcp Per  HPI/Subjective: Patient has some soreness where they did the debridement.  Appetite improving.  Still feels a little weak.  Came in with sepsis secondary to necrotizing fasciitis.  Objective: Vitals:   05/08/20 1151 05/08/20 1554  BP: (!) 135/55 120/88  Pulse: 74 86  Resp: 16 16  Temp: 98.7 F (37.1 C) 98.8 F (37.1 C)  SpO2: 93% 94%    Intake/Output Summary (Last 24 hours) at 05/08/2020 1730 Last data filed at 05/08/2020 1500 Gross per 24 hour  Intake 632.85 ml  Output 2150 ml  Net -1517.15 ml   Filed Weights   04/29/20 1418 04/30/20 0828 05/05/20 1143  Weight: 82.6 kg 103 kg 103 kg    ROS: Review of Systems  Respiratory: Negative for shortness of breath.   Cardiovascular: Negative for chest pain.  Gastrointestinal: Negative for abdominal pain, nausea and vomiting.   Exam: Physical Exam HENT:     Head: Normocephalic.     Mouth/Throat:     Pharynx: No oropharyngeal exudate.  Eyes:     General: Lids are normal.     Conjunctiva/sclera: Conjunctivae normal.     Pupils: Pupils are equal, round, and reactive to light.  Cardiovascular:     Rate and Rhythm: Normal rate and regular rhythm.     Heart sounds: Normal heart sounds, S1 normal and S2 normal.  Pulmonary:     Breath sounds: No decreased breath sounds, wheezing, rhonchi or rales.  Abdominal:     Palpations: Abdomen is soft.     Tenderness: There is no abdominal tenderness.  Musculoskeletal:     Right lower leg: No swelling.     Left lower leg: No swelling.  Skin:    General: Skin is warm.     Comments: Buttock wound covered.  Neurological:     Mental Status: She is alert and oriented to person, place, and time.       Data Reviewed: Basic Metabolic Panel: Recent Labs  Lab 05/02/20 0626  05/03/20 0634 05/04/20 0520 05/05/20 0431 05/06/20 0557  NA 143 142 140 140 135  K 3.6 3.8 3.4* 3.8 4.3  CL 116* 110 106 105 100  CO2 20* 22 24 26 26   GLUCOSE 180* 277* 159* 145* 254*  BUN 21 18 13 11 12   CREATININE 0.65 0.65 0.44 0.51 0.57  CALCIUM 8.1* 8.3* 7.9* 7.8* 8.1*  MG 1.8 1.9 1.8 1.8 1.9  PHOS <1.0* 1.6* 2.7 2.9  --    CBC: Recent Labs  Lab 05/02/20 0626 05/03/20 0634 05/04/20 0520 05/05/20 0431 05/06/20 0557  WBC 9.4 11.2* 9.9 10.0 11.6*  NEUTROABS 6.9 8.3* 6.6 6.2 8.6*  HGB 11.0* 11.1* 11.0* 11.2* 11.5*  HCT 30.9* 32.6* 32.6* 33.7* 34.7*  MCV 87.5 91.6 92.4 93.4 93.3  PLT 260 251 243 267 310    CBG: Recent Labs  Lab 05/07/20 1659 05/07/20 2245 05/08/20 0755 05/08/20 1148 05/08/20 1710  GLUCAP 180* 194* 122* 152* 178*    Recent Results (from the past 240 hour(s))  Respiratory Panel by RT PCR (Flu A&B, Covid) - Nasopharyngeal Swab     Status: None   Collection Time: 04/29/20  3:05 PM   Specimen: Nasopharyngeal Swab  Result Value Ref Range Status   SARS Coronavirus 2 by RT PCR NEGATIVE NEGATIVE Final    Comment: (  NOTE) SARS-CoV-2 target nucleic acids are NOT DETECTED.  The SARS-CoV-2 RNA is generally detectable in upper respiratoy specimens during the acute phase of infection. The lowest concentration of SARS-CoV-2 viral copies this assay can detect is 131 copies/mL. A negative result does not preclude SARS-Cov-2 infection and should not be used as the sole basis for treatment or other patient management decisions. A negative result may occur with  improper specimen collection/handling, submission of specimen other than nasopharyngeal swab, presence of viral mutation(s) within the areas targeted by this assay, and inadequate number of viral copies (<131 copies/mL). A negative result must be combined with clinical observations, patient history, and epidemiological information. The expected result is Negative.  Fact Sheet for Patients:   https://www.moore.com/  Fact Sheet for Healthcare Providers:  https://www.young.biz/  This test is no t yet approved or cleared by the [place] FDA and  has been authorized for detection and/or diagnosis of SARS-CoV-2 by FDA under an Emergency Use Authorization (EUA). This EUA will remain  in effect (meaning this test can be used) for the duration of the COVID-19 declaration under Section 564(b)(1) of the Act, 21 U.S.C. section 360bbb-3(b)(1), unless the authorization is terminated or revoked sooner.     Influenza A by PCR NEGATIVE NEGATIVE Final   Influenza B by PCR NEGATIVE NEGATIVE Final    Comment: (NOTE) The Xpert Xpress SARS-CoV-2/FLU/RSV assay is intended as an aid in  the diagnosis of influenza from Nasopharyngeal swab specimens and  should not be used as a sole basis for treatment. Nasal washings and  aspirates are unacceptable for Xpert Xpress SARS-CoV-2/FLU/RSV  testing.  Fact Sheet for Patients: https://www.moore.com/  Fact Sheet for Healthcare Providers: https://www.young.biz/  This test is not yet approved or cleared by the [place] FDA and  has been authorized for detection and/or diagnosis of SARS-CoV-2 by  FDA under an Emergency Use Authorization (EUA). This EUA will remain  in effect (meaning this test can be used) for the duration of the  Covid-19 declaration under Section 564(b)(1) of the Act, 21  U.S.C. section 360bbb-3(b)(1), unless the authorization is  terminated or revoked. Performed at [facility], [address] [place], [place] [address]   Blood culture (routine x 2)     Status: None   Collection Time: 04/29/20  3:39 PM   Specimen: BLOOD  Result Value Ref Range Status   Specimen Description BLOOD BLOOD LEFT FOREARM  Final   Special Requests   Final    BOTTLES DRAWN AEROBIC AND ANAEROBIC Blood Culture adequate volume   Culture   Final    NO  GROWTH 5 DAYS Performed at [facility], [address], [place], [place] [address]    Report Status 05/04/2020 FINAL  Final  Blood culture (routine x 2)     Status: None   Collection Time: 04/29/20  3:44 PM   Specimen: BLOOD  Result Value Ref Range Status   Specimen Description BLOOD BLOOD RIGHT FOREARM  Final   Special Requests   Final    BOTTLES DRAWN AEROBIC AND ANAEROBIC Blood Culture adequate volume   Culture   Final    NO GROWTH 5 DAYS Performed at [facility], [address] [place], [place] [address]    Report Status 05/04/2020 FINAL  Final  Urine culture     Status: Abnormal   Collection Time: 04/29/20  9:00 PM   Specimen: Urine, Catheterized  Result Value Ref Range Status   Specimen Description   Final  URINE, CATHETERIZED Performed at [facility], [address], [place], [place] [address]    Special Requests   Final    NONE Performed at [facility] [address] [place], [place] [address]    Culture MULTIPLE SPECIES PRESENT, SUGGEST RECOLLECTION (A)  Final   Report Status 05/01/2020 FINAL  Final  Aerobic/Anaerobic Culture (surgical/deep wound)     Status: None   Collection Time: 04/30/20  2:35 AM   Specimen: PATH Other; Wound  Result Value Ref Range Status   Specimen Description   Final    PERIRECTAL ABSCESS Performed at [facility], [address] [place], [place] [address]    Special Requests   Final    NONE Performed at [facility], [address] [place], [place] [address]    Gram Stain   Final    ABUNDANT WBC PRESENT, PREDOMINANTLY PMN ABUNDANT GRAM POSITIVE COCCI ABUNDANT GRAM NEGATIVE RODS ABUNDANT GRAM NEGATIVE COCCI    Culture   Final    ABUNDANT ESCHERICHIA COLI ABUNDANT GROUP B STREP(S.AGALACTIAE)ISOLATED TESTING AGAINST S. AGALACTIAE NOT ROUTINELY PERFORMED DUE TO PREDICTABILITY OF AMP/PEN/VAN SUSCEPTIBILITY. ABUNDANT BACTEROIDES FRAGILIS MODERATE PREVOTELLA  SPECIES BETA LACTAMASE POSITIVE Performed at [facility], [address] [place], [place] [address]    Report Status 05/03/2020 FINAL  Final   Organism ID, Bacteria ESCHERICHIA COLI  Final      Susceptibility   Escherichia coli - MIC*    AMPICILLIN >=32 RESISTANT Resistant     CEFAZOLIN <=4 SENSITIVE Sensitive     CEFEPIME <=0.12 SENSITIVE Sensitive     CEFTAZIDIME <=1 SENSITIVE Sensitive     CEFTRIAXONE <=0.25 SENSITIVE Sensitive     CIPROFLOXACIN 0.5 SENSITIVE Sensitive     GENTAMICIN <=1 SENSITIVE Sensitive     IMIPENEM 0.5 SENSITIVE Sensitive     TRIMETH/SULFA <=20 SENSITIVE Sensitive     AMPICILLIN/SULBACTAM 16 INTERMEDIATE Intermediate     PIP/TAZO <=4 SENSITIVE Sensitive     * ABUNDANT ESCHERICHIA COLI  Aerobic/Anaerobic Culture (surgical/deep wound)     Status: None   Collection Time: 04/30/20  2:35 AM   Specimen: PATH Other; Wound  Result Value Ref Range Status   Specimen Description   Final    PERIRECTAL ABSCESS Performed at [facility], [address] [place], [place] [address]    Special Requests   Final    NONE Performed at [facility], [address] [place], [place] [address]    Gram Stain   Final    MODERATE WBC PRESENT, PREDOMINANTLY PMN ABUNDANT GRAM NEGATIVE RODS ABUNDANT GRAM POSITIVE COCCI ABUNDANT GRAM NEGATIVE COCCI    Culture   Final    ABUNDANT ESCHERICHIA COLI ABUNDANT GROUP B STREP(S.AGALACTIAE)ISOLATED TESTING AGAINST S. AGALACTIAE NOT ROUTINELY PERFORMED DUE TO PREDICTABILITY OF AMP/PEN/VAN SUSCEPTIBILITY. ABUNDANT BACTEROIDES FRAGILIS FEW PREVOTELLA SPECIES BETA LACTAMASE POSITIVE Performed at [facility], [address], [place], [place] [address]    Report Status 05/03/2020 FINAL  Final   Organism ID, Bacteria ESCHERICHIA COLI  Final      Susceptibility   Escherichia coli - MIC*    AMPICILLIN >=32 RESISTANT Resistant     CEFAZOLIN <=4 SENSITIVE Sensitive     CEFEPIME <=0.12 SENSITIVE Sensitive      CEFTAZIDIME <=1 SENSITIVE Sensitive     CEFTRIAXONE <=0.25 SENSITIVE Sensitive     CIPROFLOXACIN <=0.25 SENSITIVE Sensitive     GENTAMICIN <=1 SENSITIVE Sensitive     IMIPENEM <=0.25 SENSITIVE Sensitive  TRIMETH/SULFA <=20 SENSITIVE Sensitive     AMPICILLIN/SULBACTAM 16 INTERMEDIATE Intermediate     PIP/TAZO <=4 SENSITIVE Sensitive     * ABUNDANT ESCHERICHIA COLI  Aerobic/Anaerobic Culture (surgical/deep wound)     Status: None   Collection Time: 04/30/20  2:35 AM   Specimen: PATH Other; Tissue  Result Value Ref Range Status   Specimen Description   Final    PERIRECTAL ABSCESS Performed at [facility], [address] [place], [place] [address]    Special Requests   Final    NONE Performed at [facility], [address] [place], [place] [address]    Gram Stain   Final    FEW WBC PRESENT, PREDOMINANTLY PMN ABUNDANT GRAM POSITIVE COCCI ABUNDANT GRAM NEGATIVE RODS    Culture   Final    FEW ESCHERICHIA COLI ABUNDANT GROUP B STREP(S.AGALACTIAE)ISOLATED TESTING AGAINST S. AGALACTIAE NOT ROUTINELY PERFORMED DUE TO PREDICTABILITY OF AMP/PEN/VAN SUSCEPTIBILITY. ABUNDANT BACTEROIDES FRAGILIS MODERATE PREVOTELLA SPECIES BETA LACTAMASE POSITIVE Performed at [facility], [address] [place], [place] [address]    Report Status 05/03/2020 FINAL  Final   Organism ID, Bacteria ESCHERICHIA COLI  Final      Susceptibility   Escherichia coli - MIC*    AMPICILLIN >=32 RESISTANT Resistant     CEFAZOLIN <=4 SENSITIVE Sensitive     CEFEPIME <=0.12 SENSITIVE Sensitive     CEFTAZIDIME <=1 SENSITIVE Sensitive     CEFTRIAXONE <=0.25 SENSITIVE Sensitive     CIPROFLOXACIN <=0.25 SENSITIVE Sensitive     GENTAMICIN <=1 SENSITIVE Sensitive     IMIPENEM 0.5 SENSITIVE Sensitive     TRIMETH/SULFA <=20 SENSITIVE Sensitive     AMPICILLIN/SULBACTAM 16 INTERMEDIATE Intermediate     PIP/TAZO <=4 SENSITIVE Sensitive     * FEW ESCHERICHIA COLI  MRSA PCR Screening      Status: None   Collection Time: 04/30/20  8:27 AM   Specimen: Nasal Mucosa; Nasopharyngeal  Result Value Ref Range Status   MRSA by PCR NEGATIVE NEGATIVE Final    Comment:        The GeneXpert MRSA Assay (FDA approved for NASAL specimens only), is one component of a comprehensive MRSA colonization surveillance program. It is not intended to diagnose MRSA infection nor to guide or monitor treatment for MRSA infections. Performed at [facility], [address] [place], [place] [address]   Aerobic Culture (superficial specimen)     Status: None   Collection Time: 04/30/20  9:44 PM   Specimen: Leg  Result Value Ref Range Status   Specimen Description   Final    LEG Performed at [facility], [address] [place], [place] [address]    Special Requests   Final    NONE Performed at [facility], [address] [place], [place] [address]    Gram Stain   Final    NO WBC SEEN MODERATE GRAM POSITIVE COCCI RARE YEAST Performed at [facility], [address] [place], [place] [address]    Culture   Final    FEW GROUP B STREP(S.AGALACTIAE)ISOLATED TESTING AGAINST S. AGALACTIAE NOT ROUTINELY PERFORMED DUE TO PREDICTABILITY OF AMP/PEN/VAN SUSCEPTIBILITY. FEW CANDIDA PARAPSILOSIS    Report Status 05/05/2020 FINAL  Final     Scheduled Meds: . aspirin EC  81 mg Oral Daily  . atorvastatin  20 mg Oral Daily  . Chlorhexidine Gluconate Cloth  6 each Topical Daily  . enoxaparin (LOVENOX) injection  0.5 mg/kg Subcutaneous Q24H  .  famotidine  20 mg Oral Daily  . fluconazole  200 mg Oral q1800  . insulin aspart  0-9 Units Subcutaneous TID AC & HS  . insulin aspart protamine- aspart  28 Units Subcutaneous Q breakfast  . metoprolol succinate  12.5 mg Oral Daily  . nutrition supplement (JUVEN)  1 packet Oral BID BM  . Ensure Max Protein  11 oz Oral BID BM  . sodium hypochlorite   Irrigation Once   Continuous Infusions: . albumin human Stopped  (04/30/20 0520)  .  ceFAZolin (ANCEF) IV 2 g (05/08/20 1427)  . metronidazole 500 mg (05/08/20 1624)    Assessment/Plan:  1. Clinical sepsis, present on admission with necrotizing fasciitis of the buttock and perianal area.  Patient currently on metronidazole and IV cefazolin.  Upon discharge we will send out on 2 weeks of p.o. Keflex and metronidazole as per infectious disease and general surgery.  Unfortunately with her insurance will not cover rehab or home health.  Family did come in and set up a time to learn how to do dressing changes.  E. coli and group B strep growing on culture. 2. Uncontrolled type 2 diabetes with hyperglycemia and resolved DKA.  With insurance issues will switch over to 70/30 insulin. 3. Hypophosphatemia.  Replaced 4. NSTEMI in setting of sepsis, DKA and electrolyte abnormalities.  On aspirin Lipitor metoprolol 5. Obesity with a BMI of 41.53 6. Weakness.  Unfortunately her insurance will not cover rehab or even home health.     Code Status:     Code Status Orders  (From admission, onward)         Start     Ordered   04/30/20 0433  Full code  Continuous        04/30/20 0433        Code Status History    This patient has a current code status but no historical code status.   Advance Care Planning Activity     Family Communication: Spoke with son on the phone Disposition Plan: Status is: Inpatient  Dispo: The patient is from: Home              Anticipated d/c is to: Home              Anticipated d/c date is: The family will have to come in to learn how to do dressing changes with her insurance not covering rehab or home health.  Family will have to be comfortable with doing dressing changes prior to disposition.              Patient currently on IV antibiotics for sepsis and necrotizing fasciitis.  Consultants:  General surgery  Infectious disease  Time spent: 28 minutes  Kasarah [employer]

## 2020-05-08 NOTE — Plan of Care (Signed)
Pt AXOX4. Calm and cooperative and able to voice her needs. Pt on IV Abx. Wound vac to peri anal area removed yesterday. Wet to dry dressing performed. Foley catheter in place with clear yellow urine. Safety measures in place. Will continue to monitor.   Problem: Education: Goal: Knowledge of General Education information will improve Description: Including pain rating scale, medication(s)/side effects and non-pharmacologic comfort measures Outcome: Progressing   Problem: Health Behavior/Discharge Planning: Goal: Ability to manage health-related needs will improve Outcome: Progressing   Problem: Clinical Measurements: Goal: Ability to maintain clinical measurements within normal limits will improve Outcome: Progressing Goal: Will remain free from infection Outcome: Progressing Goal: Diagnostic test results will improve Outcome: Progressing Goal: Respiratory complications will improve Outcome: Progressing Goal: Cardiovascular complication will be avoided Outcome: Progressing   Problem: Activity: Goal: Risk for activity intolerance will decrease Outcome: Progressing   Problem: Nutrition: Goal: Adequate nutrition will be maintained Outcome: Progressing   Problem: Coping: Goal: Level of anxiety will decrease Outcome: Progressing   Problem: Elimination: Goal: Will not experience complications related to bowel motility Outcome: Progressing Goal: Will not experience complications related to urinary retention Outcome: Progressing   Problem: Pain Managment: Goal: General experience of comfort will improve Outcome: Progressing   Problem: Safety: Goal: Ability to remain free from injury will improve Outcome: Progressing   Problem: Skin Integrity: Goal: Risk for impaired skin integrity will decrease Outcome: Progressing

## 2020-05-08 NOTE — Plan of Care (Signed)

## 2020-05-08 NOTE — Progress Notes (Signed)
Mobility Specialist - Progress Note   05/08/20 1100  Mobility  Range of Motion/Exercises Right leg;Left leg (ankle pumps, heel slides, straight leg raises, hip add/abd)  Level of Assistance Independent  Assistive Device None  Distance Ambulated (ft) 0 ft  Mobility Response Tolerated well  Mobility performed by Mobility specialist  $Mobility charge 1 Mobility    Pre-mobility: 78 HR, 95% SpO2 Post-mobility: 81 HR, 91% SpO2   Pt was lying in bed on R side upon arrival utilizing room air. Pt agreed to session. Pt denied any pain, but c/o nausea "8/10". Pt declined OOB activity this date d/t nausea. Pt was able to perform supine exercises: ankle pumps, heel slides, straight leg raises, and hip abd/add with no physical assistance. Overall, pt tolerated session well. Pt is motivated to get into recliner later this date if feeling appropriate for activity. Pt remains in bed with all needs in reach. Nurse was notified.    Filiberto Pinks Mobility Specialist 05/08/20, 11:43 AM

## 2020-05-08 NOTE — Progress Notes (Signed)
Nutrition Follow-Up Note   DOCUMENTATION CODES:   Morbid obesity  INTERVENTION:   Provide Ensure Max Protein po BID, each supplement provides 150 kcal and 30 grams of protein.  Discontinue MVI per pt request   Juven Fruit Punch BID, each serving provides 95kcal and 2.5g of protein (amino acids glutamine and arginine)  NUTRITION DIAGNOSIS:   Increased nutrient needs related to wound healing as evidenced by increased estimated needs.  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   Met with pt in room today. Pt reports that she is feeling ok today but reports that she had a rough morning. Pt reports nausea after receiving her MVI this morning. Pt believes that the MVI is causing her to have nausea and diarrhea. Pt reports that her appetite is fair in hospital; pt was able to eat 60% of her breakfast this morning. Pt is documented to be eating anywhere from sips/bites to 100% of meals. Pt reports that she is not drinking the Ensure Max as she does not like the flavors; pt is willing to try strawberry flavor as she has only had chocolate and vanilla. RD discussed with pt the importance of adequate nutrition needed for wound healing. RD will change the Ensure over to strawberry flavor. RD will discontinue MVI per pt request and add Juven supplement to support wound healing. No new weight since 10/25. Pt's weights have been inconsistent in hospital so unsure if pt has had any significant weight changes. VAC discontinued today.   RD offered additional DM diet education today. Pt reports that she does not have any additional questions. Pt  previously received DM diet education on 10/21. Pt reports that her son has been looking up a lot of information on diabetes. RD offered to come back and speak with pt or her son at any time when she feels she has questions; pt appreciative.   Medications reviewed and include: aspirin, lovenox, pepcid, diflucan, insulin, cefazolin, metronidazole   Labs reviewed: K 4.3 wnl, Mg 1.9 wnl P 2.9 wnl- 10/25 Wbc- 11.6(H) cbgs- 122, 152 x 24 hrs  Diet Order:   Diet Order            Diet Carb Modified Fluid consistency: Thin; Room service appropriate? Yes with Assist  Diet effective now                EDUCATION NEEDS:   Education needs have been addressed  Skin:  Skin Assessment: Skin Integrity Issues: Skin Integrity Issues:: Incisions Incisions: closed incision to perineum  Last BM:  10/27- type 5  Height:   Ht Readings from Last 1 Encounters:  04/30/20 _0  (1.575 m)   Weight:   Wt Readings from Last 1 Encounters:  05/05/20 103 kg   BMI:  Body mass index is 41.53 kg/m.  Estimated Nutritional Needs:   Kcal:  2000-2200  Protein:  105-115 grams  Fluid:  2 L/day  Koleen Distance MS, RD, LDN Please refer to [facility] for RD and/or RD on-call/weekend/after hours pager

## 2020-05-09 DIAGNOSIS — R531 Weakness: Secondary | ICD-10-CM

## 2020-05-09 DIAGNOSIS — B49 Unspecified mycosis: Secondary | ICD-10-CM

## 2020-05-09 LAB — BASIC METABOLIC PANEL
Anion gap: 9 (ref 5–15)
BUN: 7 mg/dL — ABNORMAL LOW (ref 8–23)
CO2: 27 mmol/L (ref 22–32)
Calcium: 8.4 mg/dL — ABNORMAL LOW (ref 8.9–10.3)
Chloride: 99 mmol/L (ref 98–111)
Creatinine, Ser: 0.56 mg/dL (ref 0.44–1.00)
GFR, Estimated: >60 mL/min (ref >60)
Glucose, Bld: 182 mg/dL — ABNORMAL HIGH (ref 70–99)
Potassium: 3.9 mmol/L (ref 3.5–5.1)
Sodium: 135 mmol/L (ref 135–145)

## 2020-05-09 LAB — CBC
HCT: 31.4 % — ABNORMAL LOW (ref 36.0–46.0)
Hemoglobin: 10.6 g/dL — ABNORMAL LOW (ref 12.0–15.0)
MCH: 32 pg (ref 26.0–34.0)
MCHC: 33.8 g/dL (ref 30.0–36.0)
MCV: 94.9 fL (ref 80.0–100.0)
Platelets: 277 10*3/uL (ref 150–400)
RBC: 3.31 MIL/uL — ABNORMAL LOW (ref 3.87–5.11)
RDW: 13.2 % (ref 11.5–15.5)
WBC: 10.8 10*3/uL — ABNORMAL HIGH (ref 4.0–10.5)
nRBC: 0 % (ref 0.0–0.2)

## 2020-05-09 LAB — GLUCOSE, CAPILLARY
Glucose-Capillary: 148 mg/dL — ABNORMAL HIGH (ref 70–99)
Glucose-Capillary: 123 mg/dL — ABNORMAL HIGH (ref 70–99)
Glucose-Capillary: 185 mg/dL — ABNORMAL HIGH (ref 70–99)
Glucose-Capillary: 166 mg/dL — ABNORMAL HIGH (ref 70–99)

## 2020-05-09 MED ORDER — METRONIDAZOLE 500 MG PO TABS
500.0000 mg | ORAL_TABLET | Freq: Three times a day (TID) | ORAL | Status: DC
  Administered 2020-05-09 – 2020-05-12 (×8): 500 mg via ORAL
  Filled 2020-05-09 (×11): qty 1

## 2020-05-09 MED ORDER — INSULIN ASPART PROT & ASPART (70-30 MIX) 100 UNIT/ML ~~LOC~~ SUSP
10.0000 [IU] | Freq: Every day | SUBCUTANEOUS | Status: DC
  Administered 2020-05-09 – 2020-05-10 (×2): 10 [IU] via SUBCUTANEOUS
  Filled 2020-05-09 (×2): qty 10

## 2020-05-09 NOTE — Progress Notes (Signed)
Patient ID: Michelle Stark, female   DOB: 03/28/1956, 64 y.o.   MRN: [id number] [facility] PROGRESS NOTE  Michelle Girtatricia A Murata [id number] DOB: 03/28/1956 DOA: 04/29/2020 PCP: Patient, No Pcp Per  HPI/Subjective: Patient seen today sitting in the chair.  Said he did better with physical therapy today.  Walked around in the room.  Just some soreness in the buttock.  Admitted with clinical sepsis.  Objective: Vitals:   05/09/20 1129 05/09/20 1150  BP: 126/60 (!) 119/58  Pulse: 75 73  Resp: 16 20  Temp: 98.5 F (36.9 C) 98.5 F (36.9 C)  SpO2: 94% 94%    Intake/Output Summary (Last 24 hours) at 05/09/2020 1508 Last data filed at 05/09/2020 1034 Gross per 24 hour  Intake 240 ml  Output 2850 ml  Net -2610 ml   Filed Weights   04/29/20 1418 04/30/20 0828 05/05/20 1143  Weight: 82.6 kg 103 kg 103 kg    ROS: Review of Systems  Respiratory: Negative for cough and shortness of breath.   Cardiovascular: Negative for chest pain.  Gastrointestinal: Negative for abdominal pain, nausea and vomiting.   Exam: Physical Exam HENT:     Head: Normocephalic.     Mouth/Throat:     Pharynx: No oropharyngeal exudate.  Eyes:     General: Lids are normal.     Conjunctiva/sclera: Conjunctivae normal.     Pupils: Pupils are equal, round, and reactive to light.  Cardiovascular:     Rate and Rhythm: Normal rate and regular rhythm.     Heart sounds: Normal heart sounds, S1 normal and S2 normal.  Pulmonary:     Breath sounds: No decreased breath sounds, wheezing, rhonchi or rales.  Abdominal:     Palpations: Abdomen is soft.     Tenderness: There is no abdominal tenderness.  Musculoskeletal:     Right lower leg: No swelling.     Left lower leg: No swelling.  Skin:    General: Skin is warm.     Findings: No rash.     Comments: Buttock wound covered.  Neurological:     Mental Status: She is alert and oriented to person, place, and time.       Data Reviewed: Basic  Metabolic Panel: Recent Labs  Lab 05/03/20 0634 05/04/20 0520 05/05/20 0431 05/06/20 0557 05/09/20 0934  NA 142 140 140 135 135  K 3.8 3.4* 3.8 4.3 3.9  CL 110 106 105 100 99  CO2 22 24 26 26 27   GLUCOSE 277* 159* 145* 254* 182*  BUN 18 13 11 12  7*  CREATININE 0.65 0.44 0.51 0.57 0.56  CALCIUM 8.3* 7.9* 7.8* 8.1* 8.4*  MG 1.9 1.8 1.8 1.9  --   PHOS 1.6* 2.7 2.9  --   --    CBC: Recent Labs  Lab 05/03/20 0634 05/04/20 0520 05/05/20 0431 05/06/20 0557 05/09/20 0934  WBC 11.2* 9.9 10.0 11.6* 10.8*  NEUTROABS 8.3* 6.6 6.2 8.6*  --   HGB 11.1* 11.0* 11.2* 11.5* 10.6*  HCT 32.6* 32.6* 33.7* 34.7* 31.4*  MCV 91.6 92.4 93.4 93.3 94.9  PLT 251 243 267 310 277    CBG: Recent Labs  Lab 05/08/20 1710 05/08/20 2051 05/08/20 2215 05/09/20 0813 05/09/20 1150  GLUCAP 178* 216* 208* 148* 123*    Recent Results (from the past 240 hour(s))  Blood culture (routine x 2)     Status: None   Collection Time: 04/29/20  3:39 PM   Specimen: BLOOD  Result Value Ref Range Status  Specimen Description BLOOD BLOOD LEFT FOREARM  Final   Special Requests   Final    BOTTLES DRAWN AEROBIC AND ANAEROBIC Blood Culture adequate volume   Culture   Final    NO GROWTH 5 DAYS Performed at [facility], [address] [place], [place] [address]    Report Status 05/04/2020 FINAL  Final  Blood culture (routine x 2)     Status: None   Collection Time: 04/29/20  3:44 PM   Specimen: BLOOD  Result Value Ref Range Status   Specimen Description BLOOD BLOOD RIGHT FOREARM  Final   Special Requests   Final    BOTTLES DRAWN AEROBIC AND ANAEROBIC Blood Culture adequate volume   Culture   Final    NO GROWTH 5 DAYS Performed at [facility], [address] [place], [place] [address]    Report Status 05/04/2020 FINAL  Final  Urine culture     Status: Abnormal   Collection Time: 04/29/20  9:00 PM   Specimen: Urine, Catheterized  Result Value Ref Range Status   Specimen  Description   Final    URINE, CATHETERIZED Performed at [facility], [address] [place], [place] [address]    Special Requests   Final    NONE Performed at [facility], [address] [place], [place] [address]    Culture MULTIPLE SPECIES PRESENT, SUGGEST RECOLLECTION (A)  Final   Report Status 05/01/2020 FINAL  Final  Aerobic/Anaerobic Culture (surgical/deep wound)     Status: None   Collection Time: 04/30/20  2:35 AM   Specimen: PATH Other; Wound  Result Value Ref Range Status   Specimen Description   Final    PERIRECTAL ABSCESS Performed at [facility], [address] [place], [place] [address]    Special Requests   Final    NONE Performed at [facility], [address] [place], [place] [address]    Gram Stain   Final    ABUNDANT WBC PRESENT, PREDOMINANTLY PMN ABUNDANT GRAM POSITIVE COCCI ABUNDANT GRAM NEGATIVE RODS ABUNDANT GRAM NEGATIVE COCCI    Culture   Final    ABUNDANT ESCHERICHIA COLI ABUNDANT GROUP B STREP(S.AGALACTIAE)ISOLATED TESTING AGAINST S. AGALACTIAE NOT ROUTINELY PERFORMED DUE TO PREDICTABILITY OF AMP/PEN/VAN SUSCEPTIBILITY. ABUNDANT BACTEROIDES FRAGILIS MODERATE PREVOTELLA SPECIES BETA LACTAMASE POSITIVE Performed at [facility], [address] [place], [place] [address]    Report Status 05/03/2020 FINAL  Final   Organism ID, Bacteria ESCHERICHIA COLI  Final      Susceptibility   Escherichia coli - MIC*    AMPICILLIN >=32 RESISTANT Resistant     CEFAZOLIN <=4 SENSITIVE Sensitive     CEFEPIME <=0.12 SENSITIVE Sensitive     CEFTAZIDIME <=1 SENSITIVE Sensitive     CEFTRIAXONE <=0.25 SENSITIVE Sensitive     CIPROFLOXACIN 0.5 SENSITIVE Sensitive     GENTAMICIN <=1 SENSITIVE Sensitive     IMIPENEM 0.5 SENSITIVE Sensitive     TRIMETH/SULFA <=20 SENSITIVE Sensitive     AMPICILLIN/SULBACTAM 16 INTERMEDIATE Intermediate     PIP/TAZO <=4 SENSITIVE Sensitive     * ABUNDANT ESCHERICHIA COLI   Aerobic/Anaerobic Culture (surgical/deep wound)     Status: None   Collection Time: 04/30/20  2:35 AM   Specimen: PATH Other; Wound  Result Value Ref Range Status   Specimen Description   Final    PERIRECTAL ABSCESS Performed at [facility], [address], [place], [place] [address]    Special Requests   Final  NONE Performed at [facility], [address] [place], [place] [address]    Gram Stain   Final    MODERATE WBC PRESENT, PREDOMINANTLY PMN ABUNDANT GRAM NEGATIVE RODS ABUNDANT GRAM POSITIVE COCCI ABUNDANT GRAM NEGATIVE COCCI    Culture   Final    ABUNDANT ESCHERICHIA COLI ABUNDANT GROUP B STREP(S.AGALACTIAE)ISOLATED TESTING AGAINST S. AGALACTIAE NOT ROUTINELY PERFORMED DUE TO PREDICTABILITY OF AMP/PEN/VAN SUSCEPTIBILITY. ABUNDANT BACTEROIDES FRAGILIS FEW PREVOTELLA SPECIES BETA LACTAMASE POSITIVE Performed at [facility], [address] [place], [place] [address]    Report Status 05/03/2020 FINAL  Final   Organism ID, Bacteria ESCHERICHIA COLI  Final      Susceptibility   Escherichia coli - MIC*    AMPICILLIN >=32 RESISTANT Resistant     CEFAZOLIN <=4 SENSITIVE Sensitive     CEFEPIME <=0.12 SENSITIVE Sensitive     CEFTAZIDIME <=1 SENSITIVE Sensitive     CEFTRIAXONE <=0.25 SENSITIVE Sensitive     CIPROFLOXACIN <=0.25 SENSITIVE Sensitive     GENTAMICIN <=1 SENSITIVE Sensitive     IMIPENEM <=0.25 SENSITIVE Sensitive     TRIMETH/SULFA <=20 SENSITIVE Sensitive     AMPICILLIN/SULBACTAM 16 INTERMEDIATE Intermediate     PIP/TAZO <=4 SENSITIVE Sensitive     * ABUNDANT ESCHERICHIA COLI  Aerobic/Anaerobic Culture (surgical/deep wound)     Status: None   Collection Time: 04/30/20  2:35 AM   Specimen: PATH Other; Tissue  Result Value Ref Range Status   Specimen Description   Final    PERIRECTAL ABSCESS Performed at [facility], [address] [place], [place] [address]    Special Requests   Final    NONE Performed at  [facility], [address] [place], [place] [address]    Gram Stain   Final    FEW WBC PRESENT, PREDOMINANTLY PMN ABUNDANT GRAM POSITIVE COCCI ABUNDANT GRAM NEGATIVE RODS    Culture   Final    FEW ESCHERICHIA COLI ABUNDANT GROUP B STREP(S.AGALACTIAE)ISOLATED TESTING AGAINST S. AGALACTIAE NOT ROUTINELY PERFORMED DUE TO PREDICTABILITY OF AMP/PEN/VAN SUSCEPTIBILITY. ABUNDANT BACTEROIDES FRAGILIS MODERATE PREVOTELLA SPECIES BETA LACTAMASE POSITIVE Performed at [facility], [address], [place], [place] [address]    Report Status 05/03/2020 FINAL  Final   Organism ID, Bacteria ESCHERICHIA COLI  Final      Susceptibility   Escherichia coli - MIC*    AMPICILLIN >=32 RESISTANT Resistant     CEFAZOLIN <=4 SENSITIVE Sensitive     CEFEPIME <=0.12 SENSITIVE Sensitive     CEFTAZIDIME <=1 SENSITIVE Sensitive     CEFTRIAXONE <=0.25 SENSITIVE Sensitive     CIPROFLOXACIN <=0.25 SENSITIVE Sensitive     GENTAMICIN <=1 SENSITIVE Sensitive     IMIPENEM 0.5 SENSITIVE Sensitive     TRIMETH/SULFA <=20 SENSITIVE Sensitive     AMPICILLIN/SULBACTAM 16 INTERMEDIATE Intermediate     PIP/TAZO <=4 SENSITIVE Sensitive     * FEW ESCHERICHIA COLI  MRSA PCR Screening     Status: None   Collection Time: 04/30/20  8:27 AM   Specimen: Nasal Mucosa; Nasopharyngeal  Result Value Ref Range Status   MRSA by PCR NEGATIVE NEGATIVE Final    Comment:        The GeneXpert MRSA Assay (FDA approved for NASAL specimens only), is one component of a comprehensive MRSA colonization surveillance program. It is not intended to diagnose MRSA infection nor to guide or monitor treatment for MRSA infections. Performed at [facility], [address] [place], [place] [address]   Aerobic  Culture (superficial specimen)     Status: None   Collection Time: 04/30/20  9:44 PM   Specimen: Leg  Result Value Ref Range Status   Specimen Description   Final    LEG Performed at [facility], [address], [place], [place] [address]    Special Requests   Final    NONE Performed at [facility], [address] [place], [place] [address]    Gram Stain   Final    NO WBC SEEN MODERATE GRAM POSITIVE COCCI RARE YEAST Performed at [facility], [address], [place], [place] [address]    Culture   Final    FEW GROUP B STREP(S.AGALACTIAE)ISOLATED TESTING AGAINST S. AGALACTIAE NOT ROUTINELY PERFORMED DUE TO PREDICTABILITY OF AMP/PEN/VAN SUSCEPTIBILITY. FEW CANDIDA PARAPSILOSIS    Report Status 05/05/2020 FINAL  Final     Scheduled Meds: . aspirin EC  81 mg Oral Daily  . atorvastatin  20 mg Oral Daily  . Chlorhexidine Gluconate Cloth  6 each Topical Daily  . enoxaparin (LOVENOX) injection  0.5 mg/kg Subcutaneous Q24H  . famotidine  20 mg Oral Daily  . fluconazole  200 mg Oral q1800  . insulin aspart  0-9 Units Subcutaneous TID AC & HS  . insulin aspart protamine- aspart  28 Units Subcutaneous Q breakfast  . metoprolol succinate  12.5 mg Oral Daily  . metroNIDAZOLE  500 mg Oral Q8H  . nutrition supplement (JUVEN)  1 packet Oral BID BM  . Ensure Max Protein  11 oz Oral BID BM  . sodium hypochlorite   Irrigation Once   Continuous Infusions: . albumin human Stopped (04/30/20 0520)  .  ceFAZolin (ANCEF) IV 2 g (05/09/20 1319)    Assessment/Plan:  1. Clinical sepsis, present on admission with necrotizing fasciitis of the buttock and perianal area.  Currently on IV cefazolin and metronidazole.  Upon discharge we will give 2 weeks of Keflex and metronidazole as per infectious disease.  General surgery will follow up as outpatient.  3 debridements were done here in the hospital.  Family to come in tomorrow morning to learn how to do dressing changes since home health unable to be set up.  E. coli, group B strep and Bacteroides growing out of culture. 2. Fungal infection leg on Diflucan also. 3. Uncontrolled type 2 diabetes with hyperglycemia and  resolved DKA.  Patient on 70/30 insulin 28 units in the morning and 10 units at dinner. 4. NSTEMI in the setting of sepsis and DKA and electrolyte abnormalities.  Continue aspirin, Lipitor and metoprolol. 5. Obesity with a BMI of 41.53 6. Weakness.  Insurance company did not cover rehab or even home health.  Patient did better today with physical therapy.    Code Status:     Code Status Orders  (From admission, onward)         Start     Ordered   04/30/20 0433  Full code  Continuous        04/30/20 0433        Code Status History    This patient has a current code status but no historical code status.   Advance Care Planning Activity     Family Communication: Spoke with patient's son on the phone Disposition Plan: Status is: Inpatient  Dispo: The patient is from: Home              Anticipated d/c is to: Home  Anticipated d/c date is: Family to come in tomorrow to learn how to do dressings of the wound.  Depending on what time they come can potential discharge tomorrow versus Sunday.              Patient currently being followed closely for necrotizing fasciitis currently on IV antibiotics.  Since patient is unable to go to rehab or have home health set up that means family must be comfortable with doing dressing changes.  Family to come in tomorrow to help with dressing change.  Consultants:  Infectious disease  General surgery  Time spent: 27 minutes, case discussed with physical therapy.  Declan Adamson [employer]  [facility] [employer]

## 2020-05-09 NOTE — Progress Notes (Signed)
   Date of Admission:  04/29/2020    ID: Michelle Stark is a 64 y.o. female  Active Problems:   Hypertension   HLD (hyperlipidemia)   Obesity   DKA (diabetic ketoacidosis) (HCC)   AKI (acute kidney injury) (HCC)   Dehydration   Metabolic acidosis due to diabetes mellitus (HCC)   Sepsis (HCC)   Abscess and cellulitis of gluteal region   Elevated troponin   Reactive thrombocytosis   Hypophosphatemia   Necrotizing fasciitis (HCC)   Uncontrolled type 2 diabetes mellitus with hyperglycemia (HCC)   NSTEMI (non-ST elevated myocardial infarction) (HCC)   Obesity, Class III, BMI 40-49.9 (morbid obesity) (HCC)    Subjective: Doing well   Medications:  . aspirin EC  81 mg Oral Daily  . atorvastatin  20 mg Oral Daily  . Chlorhexidine Gluconate Cloth  6 each Topical Daily  . enoxaparin (LOVENOX) injection  0.5 mg/kg Subcutaneous Q24H  . famotidine  20 mg Oral Daily  . fluconazole  200 mg Oral q1800  . insulin aspart  0-9 Units Subcutaneous TID AC & HS  . insulin aspart protamine- aspart  28 Units Subcutaneous Q breakfast  . metoprolol succinate  12.5 mg Oral Daily  . nutrition supplement (JUVEN)  1 packet Oral BID BM  . Ensure Max Protein  11 oz Oral BID BM  . sodium hypochlorite   Irrigation Once    Objective: Vital signs in last 24 hours: Temp:  [98.5 F (36.9 C)-99.5 F (37.5 C)] 98.5 F (36.9 C) (10/29 1150) Pulse Rate:  [73-93] 73 (10/29 1150) Resp:  [16-20] 20 (10/29 1150) BP: (114-139)/(50-88) 119/58 (10/29 1150) SpO2:  [91 %-96 %] 94 % (10/29 1150)  PHYSICAL EXAM:  General: Alert, cooperative, no distress, appears stated age.  Dressing not removed from the perineal area Extremities: atraumatic, no cyanosis. No edema. No clubbing Skin: No rashes or lesions. Or bruising Lymph: Cervical, supraclavicular normal. Neurologic: Grossly non-focal  Lab Results Recent Labs    05/09/20 0934  WBC 10.8*  HGB 10.6*  HCT 31.4*  NA 135  K 3.9  CL 99  CO2 27  BUN  7*  CREATININE 0.56  Microbiology: ABUNDANT ESCHERICHIA COLI  ABUNDANT GROUP B STREP(S.AGALACTIAE)ISOLATED  TESTING AGAINST S. AGALACTIAE NOT ROUTINELY PERFORMED DUE TO PREDICTABILITY OF AMP/PEN/VAN SUSCEPTIBILITY.  ABUNDANT BACTEROIDES FRAGILIS  FEW PREVOTELLA SPECIES  BETA LACTAMASE POSITIVE  Studies/Results: No results found.   Assessment/Plan:  Newly diagnosed Diabetes was found to be in DKA corrected  Necrotizing fascitis of the perianal/gluteal/perineal( rt ) area- s/p excisional debridement polymicrobial infection with aerobes and anerobes  E.coli, GA B strep, anerobes On cefazolin + flagyl As per Dr.Pabon wound is doing better but contaminating with stool- unable to place a wound vac Pt on discharge could be switched to PO Keflex + flagyl for 2 more weeks Severe leucocytosis secondary to the infection and DKA-resolved AKI due to dehydration -much improved Increase in troponin- seen by cardiology Left calf ulcerating wound-- with candida- on Po fluconazole for 5 days   Discussed the management with patient and hospitalist ID will sign off- call if needed

## 2020-05-09 NOTE — Progress Notes (Signed)
Physical Therapy Treatment Patient Details Name: Michelle Stark MRN: [id number] DOB: 01-23-1956 Today's Date: 05/09/2020    History of Present Illness Patient is a 64 year old female who presented to Ed for nausea vominting, increased thirst and fatigue, was found to be in severe metabolic acidosis. Was found to have a sacral wound with surgical I and D performed on 10/20, 10/22 adiditional surgery for ID, 10/25 third debridement .  with PMH of HTN, obesity, HLD.    PT Comments    Pt received supine in bed, agreed to PT session. Discussed POC and set goals, pt with good understanding, Supine to sit with reliance on side rails and increased time to come to EOB with SBA. Sitting EOB x 3 minutes. Sit to stand with increased time and SBA for hand placement. Pt completed gait training in room x 30 feet with RW and SBA, vc's for safe technique. Pt positioned comfortably in recliner with pillow under right hip to decrease pressure at sacral region. Notified nursing of treatment session. Pt left sitting up in chair with alarm, call bell in reach, MD in room with pt. Con't PT per POC. Anticipate d/c home once medically stable.  Follow Up Recommendations  Home health PT;Supervision - Intermittent     Equipment Recommendations  Rolling walker with 5" wheels;3in1 (PT)    Recommendations for Other Services       Precautions / Restrictions Precautions Precautions: Fall Precaution Comments: sacral wound Restrictions Weight Bearing Restrictions: No    Mobility  Bed Mobility Overal bed mobility: Needs Assistance Bed Mobility: Rolling;Sidelying to Sit;Sit to Sidelying;Sit to Supine Rolling: Supervision Sidelying to sit: Supervision   Sit to supine: Supervision Sit to sidelying: Supervision;HOB elevated General bed mobility comments: Pt was able to perform bed mobility without physical assistance however did use bedrails. will need someone to assist her at home at [place]. discussed this with son  via phone  Transfers Overall transfer level: Modified independent Equipment used: Rolling walker (2 wheeled) Transfers: Sit to/from Stand Sit to Stand: Modified independent (Device/Increase time)         General transfer comment: pt easily able to stand from EOB and from standard height chair without assistance  Ambulation/Gait Ambulation/Gait assistance: Supervision Gait Distance (Feet): 30 Feet Assistive device: Rolling walker (2 wheeled) Gait Pattern/deviations: Step-through pattern;Trunk flexed Gait velocity: decreased   General Gait Details: pt demonstrated safe steady gait kinematics but limited distance 2/2 to pt unwilling to leave room and endorses fatigue/pain. no balance concerns    Stairs             Wheelchair Mobility    Modified Rankin (Stroke Patients Only)       Balance Overall balance assessment: Needs assistance Sitting-balance support: Feet supported;Bilateral upper extremity supported Sitting balance-Leahy Scale: Good Sitting balance - Comments: no LOB with feet support only   Standing balance support: During functional activity;Bilateral upper extremity supported Standing balance-Leahy Scale: Good Standing balance comment:  (Pt able to maintain unsupported static standing x 1 min)                            Cognition Arousal/Alertness: Awake/alert Behavior During Therapy: WFL for tasks assessed/performed Overall Cognitive Status: Within Functional Limits for tasks assessed                                 General Comments: Pt was A and  O x 4 and agreeable to OOB activity      Exercises      General Comments General comments (skin integrity, edema, etc.):  (Wound Vac not attached upon arrival)      Pertinent Vitals/Pain Pain Assessment: 0-10 Pain Score: 4  Pain Location: sacral area Pain Descriptors / Indicators: Discomfort Pain Intervention(s): Repositioned;Monitored during session    Home Living                       Prior Function            PT Goals (current goals can now be found in the care plan section) Acute Rehab PT Goals Patient Stated Goal: get better and go home Progress towards PT goals: Progressing toward goals    Frequency    Min 2X/week      PT Plan Discharge plan needs to be updated    Co-evaluation              AM-PAC PT "6 Clicks" Mobility   Outcome Measure  Help needed turning from your back to your side while in a flat bed without using bedrails?: A Little Help needed moving from lying on your back to sitting on the side of a flat bed without using bedrails?: A Little Help needed moving to and from a bed to a chair (including a wheelchair)?: A Little Help needed standing up from a chair using your arms (e.g., wheelchair or bedside chair)?: A Little Help needed to walk in hospital room?: A Little Help needed climbing 3-5 steps with a railing? : A Little 6 Click Score: 18    End of Session Equipment Utilized During Treatment: Gait belt Activity Tolerance: Patient tolerated treatment well Patient left: with call bell/phone within reach;with bed alarm set;with nursing/sitter in room;in chair Nurse Communication: Mobility status PT Visit Diagnosis: Muscle weakness (generalized) (M62.81);Other abnormalities of gait and mobility (R26.89);Difficulty in walking, not elsewhere classified (R26.2)     Time: 1025-8527 PT Time Calculation (min) (ACUTE ONLY): 40 min  Charges:  $Gait Training: 8-22 mins $Therapeutic Activity: 23-37 mins                     Zadie Cleverly, PTA   Jannet Askew 05/09/2020, 1:18 PM

## 2020-05-09 NOTE — Progress Notes (Signed)
Mobility Specialist - Progress Note   05/09/20 1425  Mobility  Activity Refused mobility  Mobility performed by Mobility specialist    Pt politely declined mobility session at this time. States "I just got back in the bed", would like to rest. Will re-attempt at a later date/time.    Cecelia Graciano Mobility Specialist  05/09/20, 2:26 PM

## 2020-05-09 NOTE — TOC Benefit Eligibility Note (Signed)
Transition of Care [facility]) Benefit Eligibility Note    Patient Details  Name: Michelle Stark MRN: [id number] Date of Birth: [birth year]   Medication/Dose: Lantus 40 units nightly  Covered?: Yes   Prescription Coverage Preferred Pharmacy: [facility], [facility] and [facility]  Spoke with Person/Company/Phone Number:: Clayborne Dana with [employer] at [phone]  Co-Pay: $56.48 estimated cost for 28 day supply  Prior Approval: No  Deductible:  (No deductible on plan)  Additional Notes: Per rep, patient cannot use [facility] or [employer] with plan.    [name] Phone Number: [phone] or [phone] 05/09/2020, 9:31 AM

## 2020-05-09 NOTE — Progress Notes (Signed)
Clarified with NP if the pt needs to be on telemetry. There is no order for cardiac monitoring. Per NP, pt does not need tele at this time.

## 2020-05-09 NOTE — Progress Notes (Signed)
Inpatient Diabetes Program Recommendations  AACE/ADA: New Consensus Statement on Inpatient Glycemic Control   Target Ranges:  Prepandial:   less than 140 mg/dL      Peak postprandial:   less than 180 mg/dL (1-2 hours)      Critically ill patients:  140 - 180 mg/dL   Results for RON, JUNCO (MRN [id number]) as of 05/09/2020 08:32  Ref. Range 05/08/2020 07:55 05/08/2020 11:48 05/08/2020 17:10 05/08/2020 20:51 05/08/2020 22:15 05/09/2020 08:13  Glucose-Capillary Latest Ref Range: 70 - 99 mg/dL 734 (H)  Novolog 6 units 152 (H)  Novolog 2 units  70/30 28 units 178 (H)  Novolog 2 units 216 (H)  Novolog 3 units 208 (H) 148 (H)   Review of Glycemic Control  Diabetes history: No Outpatient Diabetes medications: NA Current orders for Inpatient glycemic control: 70/30 28 units QAM, Novolog 0-9 units AC&HS  Inpatient Diabetes Program Recommendations:    Insulin: Please consider adding 70/30 10 units QPM with supper.  Thanks, Orlando Penner, RN, MSN, CDE Diabetes Coordinator Inpatient Diabetes Program [phone] (Team Pager from 8am to 5pm)

## 2020-05-09 NOTE — TOC Benefit Eligibility Note (Signed)
Transition of Care [facility]) Benefit Eligibility Note    Patient Details  Name: AJAH VANHOOSE MRN: [id number] Date of Birth: October 05, 1955   Medication/Dose: Novolog 5 units TID with meals  Covered?: Yes   Prescription Coverage Preferred Pharmacy: [facility], [facility] and [facility]  Spoke with Person/Company/Phone Number:: Clayborne Dana with [employer] at [phone]  Co-Pay: $100 estimated copay for 28 day supply  Prior Approval: No (Novolog considered non-formulary, however PA not required per rep.)  Deductible:  (No deductible on plan)  Additional Notes: Per rep, patient cannot use [facility] or [employer] with plan.    [name] Phone Number: [phone] or [phone] 05/09/2020, 9:34 AM

## 2020-05-10 LAB — GLUCOSE, CAPILLARY
Glucose-Capillary: 162 mg/dL — ABNORMAL HIGH (ref 70–99)
Glucose-Capillary: 202 mg/dL — ABNORMAL HIGH (ref 70–99)
Glucose-Capillary: 193 mg/dL — ABNORMAL HIGH (ref 70–99)
Glucose-Capillary: 232 mg/dL — ABNORMAL HIGH (ref 70–99)

## 2020-05-10 NOTE — Progress Notes (Signed)
Patient ID: Michelle Stark, female   DOB: 01-12-1956, 64 y.o.   MRN: [id number] [facility] PROGRESS NOTE  ALLYNA PITTSLEY QGB:[id number] DOB: May 17, 1956 DOA: 04/29/2020 PCP: Patient, No Pcp Per  HPI/Subjective: Patient feeling okay.  Still feels a little sore.  Still waiting for family to come in for dressing change.  Admitted with sepsis.  Objective: Vitals:   05/10/20 0811 05/10/20 1148  BP: (!) 118/57 134/61  Pulse: 75 72  Resp: 15 20  Temp: 98.4 F (36.9 C) 98.2 F (36.8 C)  SpO2: 95% 95%    Intake/Output Summary (Last 24 hours) at 05/10/2020 1618 Last data filed at 05/10/2020 0345 Gross per 24 hour  Intake 240 ml  Output 2800 ml  Net -2560 ml   Filed Weights   04/29/20 1418 04/30/20 0828 05/05/20 1143  Weight: 82.6 kg 103 kg 103 kg    ROS: Review of Systems  Respiratory: Negative for shortness of breath.   Cardiovascular: Negative for chest pain.  Gastrointestinal: Negative for abdominal pain, nausea and vomiting.   Exam: Physical Exam HENT:     Head: Normocephalic.     Mouth/Throat:     Pharynx: No oropharyngeal exudate.  Eyes:     General: Lids are normal.     Conjunctiva/sclera: Conjunctivae normal.     Pupils: Pupils are equal, round, and reactive to light.  Cardiovascular:     Rate and Rhythm: Normal rate and regular rhythm.     Heart sounds: Normal heart sounds, S1 normal and S2 normal.  Pulmonary:     Breath sounds: No decreased breath sounds, wheezing, rhonchi or rales.  Abdominal:     Palpations: Abdomen is soft.     Tenderness: There is no abdominal tenderness.  Musculoskeletal:     Right lower leg: No swelling.     Left lower leg: No swelling.  Skin:    General: Skin is warm.     Findings: No rash.     Comments: Buttock wound covered.  Neurological:     Mental Status: She is alert and oriented to person, place, and time.       Data Reviewed: Basic Metabolic Panel: Recent Labs  Lab 05/04/20 0520 05/05/20 0431  05/06/20 0557 05/09/20 0934  NA 140 140 135 135  K 3.4* 3.8 4.3 3.9  CL 106 105 100 99  CO2 24 26 26 27   GLUCOSE 159* 145* 254* 182*  BUN 13 11 12  7*  CREATININE 0.44 0.51 0.57 0.56  CALCIUM 7.9* 7.8* 8.1* 8.4*  MG 1.8 1.8 1.9  --   PHOS 2.7 2.9  --   --    CBC: Recent Labs  Lab 05/04/20 0520 05/05/20 0431 05/06/20 0557 05/09/20 0934  WBC 9.9 10.0 11.6* 10.8*  NEUTROABS 6.6 6.2 8.6*  --   HGB 11.0* 11.2* 11.5* 10.6*  HCT 32.6* 33.7* 34.7* 31.4*  MCV 92.4 93.4 93.3 94.9  PLT 243 267 310 277   CBG: Recent Labs  Lab 05/09/20 1150 05/09/20 1728 05/09/20 2058 05/10/20 0810 05/10/20 1144  GLUCAP 123* 185* 166* 162* 202*    Recent Results (from the past 240 hour(s))  Aerobic Culture (superficial specimen)     Status: None   Collection Time: 04/30/20  9:44 PM   Specimen: Leg  Result Value Ref Range Status   Specimen Description   Final    LEG Performed at [facility], [address], [place], [address] [place]    Special Requests   Final  NONE Performed at [facility], [address] [place], [place] [address]    Gram Stain   Final    NO WBC SEEN MODERATE GRAM POSITIVE COCCI RARE YEAST Performed at [facility], [address], [place], [place] [address]    Culture   Final    FEW GROUP B STREP(S.AGALACTIAE)ISOLATED TESTING AGAINST S. AGALACTIAE NOT ROUTINELY PERFORMED DUE TO PREDICTABILITY OF AMP/PEN/VAN SUSCEPTIBILITY. FEW CANDIDA PARAPSILOSIS    Report Status 05/05/2020 FINAL  Final      Scheduled Meds: . aspirin EC  81 mg Oral Daily  . atorvastatin  20 mg Oral Daily  . Chlorhexidine Gluconate Cloth  6 each Topical Daily  . enoxaparin (LOVENOX) injection  0.5 mg/kg Subcutaneous Q24H  . famotidine  20 mg Oral Daily  . fluconazole  200 mg Oral q1800  . insulin aspart protamine- aspart  10 Units Subcutaneous Q supper  . insulin aspart protamine- aspart  28 Units Subcutaneous Q breakfast  . metoprolol succinate  12.5 mg  Oral Daily  . metroNIDAZOLE  500 mg Oral Q8H  . nutrition supplement (JUVEN)  1 packet Oral BID BM  . Ensure Max Protein  11 oz Oral BID BM  . sodium hypochlorite   Irrigation Once   Continuous Infusions: . albumin human Stopped (04/30/20 0520)  .  ceFAZolin (ANCEF) IV 2 g (05/10/20 1444)    Assessment/Plan:   1. Clinical sepsis, present on admission with necrotizing fasciitis of the buttock and perianal area.  Patiently currently receiving IV cefazolin and metronidazole.  Upon discharge we will give 2 weeks of Keflex and metronidazole.  Patient had 3 debridements here by general surgery and now on wet-to-dry dressings.  E. coli, group B strep and Bacteroides growing on culture.  Still awaiting family to come in today to learn how to do dressing changes.  Unfortunately unable to go to rehab or set up home health with her insurance. 2. Fungal infection on leg on Diflucan 3. Uncontrolled diabetes type 2 with hyperglycemia and resolved DKA.  Continue 70/30 insulin 28 units in the morning and 10 units at dinner 4. NSTEMI.  This occurred in the setting of sepsis and DKA and electrolyte abnormalities.  Patient on aspirin, Lipitor metoprolol 5. Obesity with a BMI of 41.53 6. Weakness.  Insurance company not covering rehab or home health.  Patient will have to go home.        Code Status:     Code Status Orders  (From admission, onward)         Start     Ordered   04/30/20 0433  Full code  Continuous        04/30/20 0433        Code Status History    This patient has a current code status but no historical code status.   Advance Care Planning Activity     Family Communication: Tried calling son today and his mailbox was full.  He stated that a family member was supposed to come in today to learn how to do the dressing change.  As of this afternoon no family member yet. Disposition Plan: Status is: Inpatient  Dispo: The patient is from: Home              Anticipated d/c is to:  Home              Anticipated d/c date is: Whenever family comes in to learn how to do dressing changes can probably get home  either same day or next day.              Patient currently can be discharge once family comes in and is comfortable with doing the dressing changes.  Unfortunately with the patient's insurance cannot go out to rehab or have home health set up.  Family has to do dressing changes.  Time spent: 28 minutes  Athelene Hursey [employer]

## 2020-05-11 LAB — GLUCOSE, CAPILLARY
Glucose-Capillary: 211 mg/dL — ABNORMAL HIGH (ref 70–99)
Glucose-Capillary: 186 mg/dL — ABNORMAL HIGH (ref 70–99)
Glucose-Capillary: 163 mg/dL — ABNORMAL HIGH (ref 70–99)
Glucose-Capillary: 157 mg/dL — ABNORMAL HIGH (ref 70–99)

## 2020-05-11 MED ORDER — RISAQUAD PO CAPS
2.0000 | ORAL_CAPSULE | Freq: Every day | ORAL | Status: DC
  Administered 2020-05-12: 2 via ORAL
  Filled 2020-05-11: qty 2

## 2020-05-11 MED ORDER — INSULIN PEN NEEDLE 33G X 8 MM MISC: 1.0000 | Freq: Two times a day (BID) | 0 refills | Status: AC

## 2020-05-11 MED ORDER — INSULIN ASPART PROT & ASPART (70-30 MIX) 100 UNIT/ML ~~LOC~~ SUSP
15.0000 [IU] | Freq: Every day | SUBCUTANEOUS | Status: DC
  Administered 2020-05-11: 15 [IU] via SUBCUTANEOUS
  Filled 2020-05-11: qty 10

## 2020-05-11 MED ORDER — ATORVASTATIN CALCIUM 20 MG PO TABS
20.0000 mg | ORAL_TABLET | Freq: Every day | ORAL | 2 refills | Status: DC
Start: 2020-05-11 — End: 2020-05-28

## 2020-05-11 MED ORDER — JUVEN PO PACK
1.0000 | PACK | Freq: Two times a day (BID) | ORAL | 1 refills | Status: DC
Start: 2020-05-11 — End: 2020-05-28

## 2020-05-11 MED ORDER — ENSURE MAX PROTEIN PO LIQD
11.0000 [oz_av] | Freq: Two times a day (BID) | ORAL | 2 refills | Status: DC
Start: 2020-05-11 — End: 2020-07-08

## 2020-05-11 MED ORDER — INSULIN ASPART PROT & ASPART (70-30 MIX) 100 UNIT/ML ~~LOC~~ SUSP
30.0000 [IU] | Freq: Every day | SUBCUTANEOUS | Status: DC
  Administered 2020-05-12: 30 [IU] via SUBCUTANEOUS
  Filled 2020-05-11 (×2): qty 10

## 2020-05-11 MED ORDER — INSULIN ISOPHANE & REGULAR (HUMAN 70-30)100 UNIT/ML KWIKPEN: PEN_INJECTOR | SUBCUTANEOUS | 2 refills | Status: DC

## 2020-05-11 MED ORDER — OXYCODONE-ACETAMINOPHEN 5-325 MG PO TABS
1.0000 | ORAL_TABLET | Freq: Four times a day (QID) | ORAL | 0 refills | Status: DC | PRN
Start: 2020-05-11 — End: 2020-05-28

## 2020-05-11 MED ORDER — ASPIRIN 81 MG PO TBEC: 81.0000 mg | DELAYED_RELEASE_TABLET | Freq: Every day | ORAL | 2 refills | Status: AC

## 2020-05-11 MED ORDER — METRONIDAZOLE 500 MG PO TABS: 500.0000 mg | ORAL_TABLET | Freq: Three times a day (TID) | ORAL | 0 refills | Status: AC

## 2020-05-11 MED ORDER — GLUCOSE BLOOD VI STRP: ORAL_STRIP | 12 refills | Status: AC

## 2020-05-11 MED ORDER — BLOOD GLUCOSE MONITOR KIT: PACK | 0 refills | Status: AC

## 2020-05-11 MED ORDER — CEPHALEXIN 500 MG PO CAPS: 500.0000 mg | ORAL_CAPSULE | Freq: Three times a day (TID) | ORAL | 0 refills | Status: AC

## 2020-05-11 MED ORDER — RISAQUAD PO CAPS: ORAL_CAPSULE | ORAL | 0 refills | Status: AC

## 2020-05-11 MED ORDER — METOPROLOL SUCCINATE ER 25 MG PO TB24
12.5000 mg | ORAL_TABLET | Freq: Every day | ORAL | 0 refills | Status: DC
Start: 2020-05-12 — End: 2020-05-28

## 2020-05-11 NOTE — Progress Notes (Signed)
Patient ID: Michelle Stark, female   DOB: September 30, 1955, 64 y.o.   MRN: [id number] [facility] PROGRESS NOTE  Michelle Stark QLR:[id number] DOB: 01/22/1956 DOA: 04/29/2020 PCP: Patient, No Pcp Per  HPI/Subjective: Patient feeling okay. Family still has not come in to learn how to do dressing changes. Admitted initially with necrotizing fasciitis with DKA.  Objective: Vitals:   05/11/20 0731 05/11/20 1132  BP: 135/68 140/81  Pulse: 77 75  Resp: 18 20  Temp: 98.1 F (36.7 C) 98.7 F (37.1 C)  SpO2: 94% 96%    Intake/Output Summary (Last 24 hours) at 05/11/2020 1405 Last data filed at 05/11/2020 1100 Gross per 24 hour  Intake 0 ml  Output 2150 ml  Net -2150 ml   Filed Weights   04/29/20 1418 04/30/20 0828 05/05/20 1143  Weight: 82.6 kg 103 kg 103 kg    ROS: Review of Systems  Respiratory: Negative for shortness of breath.   Cardiovascular: Negative for chest pain.  Gastrointestinal: Negative for abdominal pain, nausea and vomiting.   Exam: Physical Exam HENT:     Head: Normocephalic.     Mouth/Throat:     Pharynx: No oropharyngeal exudate.  Eyes:     General: Lids are normal.     Conjunctiva/sclera: Conjunctivae normal.     Pupils: Pupils are equal, round, and reactive to light.  Cardiovascular:     Rate and Rhythm: Normal rate and regular rhythm.     Heart sounds: Normal heart sounds, S1 normal and S2 normal.  Pulmonary:     Breath sounds: No decreased breath sounds, wheezing, rhonchi or rales.  Abdominal:     Palpations: Abdomen is soft.     Tenderness: There is no abdominal tenderness.  Musculoskeletal:     Right lower leg: No swelling.     Left lower leg: No swelling.  Skin:    General: Skin is warm.     Findings: No rash.  Neurological:     Mental Status: She is alert and oriented to person, place, and time.       Data Reviewed: Basic Metabolic Panel: Recent Labs  Lab 05/05/20 0431 05/06/20 0557 05/09/20 0934  NA 140 135 135  K  3.8 4.3 3.9  CL 105 100 99  CO2 26 26 27   GLUCOSE 145* 254* 182*  BUN 11 12 7*  CREATININE 0.51 0.57 0.56  CALCIUM 7.8* 8.1* 8.4*  MG 1.8 1.9  --   PHOS 2.9  --   --    CBC: Recent Labs  Lab 05/05/20 0431 05/06/20 0557 05/09/20 0934  WBC 10.0 11.6* 10.8*  NEUTROABS 6.2 8.6*  --   HGB 11.2* 11.5* 10.6*  HCT 33.7* 34.7* 31.4*  MCV 93.4 93.3 94.9  PLT 267 310 277    CBG: Recent Labs  Lab 05/10/20 1144 05/10/20 1649 05/10/20 2022 05/11/20 0753 05/11/20 1136  GLUCAP 202* 193* 232* 211* 186*    Scheduled Meds: . acidophilus  2 capsule Oral Daily  . aspirin EC  81 mg Oral Daily  . atorvastatin  20 mg Oral Daily  . Chlorhexidine Gluconate Cloth  6 each Topical Daily  . enoxaparin (LOVENOX) injection  0.5 mg/kg Subcutaneous Q24H  . famotidine  20 mg Oral Daily  . insulin aspart protamine- aspart  15 Units Subcutaneous Q supper  . [START ON 05/12/2020] insulin aspart protamine- aspart  30 Units Subcutaneous Q breakfast  . metoprolol succinate  12.5 mg Oral Daily  . metroNIDAZOLE  500 mg Oral Q8H  .  nutrition supplement (JUVEN)  1 packet Oral BID BM  . Ensure Max Protein  11 oz Oral BID BM  . sodium hypochlorite   Irrigation Once   Continuous Infusions: . albumin human Stopped (04/30/20 0520)  .  ceFAZolin (ANCEF) IV 2 g (05/11/20 1400)    Assessment/Plan:  1. Clinical sepsis, present on admission with necrotizing fasciitis of the buttock and perianal area. Patient had 3 debridements here in the hospital by general surgery and now on wet-to-dry dressings. Organisms growing include E. coli, group B strep and Bacteroides. Currently on IV cefazolin and metronidazole here. Upon discharge will give 2 weeks of Keflex and metronidazole. Patient will also receive a probiotic. Still awaiting family to come in to learn how to do dressing changes. Unfortunately her insurance does not cover rehab or home health. 2. Uncontrolled type 2 diabetes mellitus with hyperglycemia. Resolved  DKA. Continue 70/30 insulin but increase to 30 units in the morning and 15 units at dinner 3. NSTEMI. This occurred in the setting of sepsis DKA and electrolyte abnormalities. Continue aspirin Lipitor metoprolol 4. Obesity. BMI of 41.53 5. Weakness. Unfortunately insurance company will not cover rehab or home health.     Code Status:     Code Status Orders  (From admission, onward)         Start     Ordered   04/30/20 0433  Full code  Continuous        04/30/20 0433        Code Status History    This patient has a current code status but no historical code status.   Advance Care Planning Activity     Family Communication: Son's voicemail is full and unable to leave a message. Disposition Plan: Status is: Inpatient  Dispo: The patient is from: Home              Anticipated d/c is to: Home              Anticipated d/c date is: Awaiting family to come in to learn how to do dressing changes. Potential discharge 05/12/2020              Patient currently ready to be discharged but family must come in to learn how to do dressing changes since insurance company does not cover rehab or home health agency.  Time spent: 27 minutes.  Binnie Droessler [employer]  [facility] [employer]

## 2020-05-11 NOTE — Discharge Instructions (Signed)
Wet to dry dressing daily and as needed. Wet sterile gauze 4 by 4 with saline, cover with abd pad and then secure with paper tape

## 2020-05-11 NOTE — Progress Notes (Signed)
Inpatient Diabetes Program Recommendations  AACE/ADA: New Consensus Statement on Inpatient Glycemic Control   Target Ranges:  Prepandial:   less than 140 mg/dL      Peak postprandial:   less than 180 mg/dL (1-2 hours)      Critically ill patients:  140 - 180 mg/dL   Results for MADELINE, PHO (MRN [id number]) as of 05/11/2020 08:17  Ref. Range 05/10/2020 08:10 05/10/2020 11:44 05/10/2020 16:49 05/10/2020 20:22 05/11/2020 07:53  Glucose-Capillary Latest Ref Range: 70 - 99 mg/dL 579 (H) 728 (H) 206 (H) 232 (H) 211 (H)   Review of Glycemic Control  Diabetes history: No Outpatient Diabetes medications: NA Current orders for Inpatient glycemic control: 70/30 28 units QAM, 70/30 units 10 units QPM, Novolog 0-9 units AC&HS  Inpatient Diabetes Program Recommendations:    Insulin: Please consider increasing 70/30 to 30 units QAM with breakfast and 15 units QPM with supper.  Thanks, Orlando Penner, RN, MSN, CDE Diabetes Coordinator Inpatient Diabetes Program [phone] (Team Pager from 8am to 5pm)

## 2020-05-12 LAB — GLUCOSE, CAPILLARY
Glucose-Capillary: 173 mg/dL — ABNORMAL HIGH (ref 70–99)
Glucose-Capillary: 244 mg/dL — ABNORMAL HIGH (ref 70–99)

## 2020-05-12 NOTE — Plan of Care (Signed)
Demonstrated moist to dry dressing change with Patient's niece Jill Side during discharge instructions. Patient given some supplies for home use. Patient and niece understood instructions and had no further questions.

## 2020-05-12 NOTE — Discharge Summary (Signed)
[place] [facility] [place] NAME: Michelle Stark    MR#:  [id number]  DATE OF BIRTH:  1955-11-06  DATE OF ADMISSION:  04/29/2020 ADMITTING PHYSICIAN: Jules Husbands, MD  DATE OF DISCHARGE: 05/12/2020  PRIMARY CARE PHYSICIAN: Dr Waunita Schooner    ADMISSION DIAGNOSIS:  Dehydration [E86.0] Sinus tachycardia [R00.0] Hypokalemia [E87.6] DKA (diabetic ketoacidosis) ([place]) [E11.10] Diabetic ketoacidosis without coma associated with type 2 diabetes mellitus ([place]) [E11.10] Sepsis ([place]) [A41.9]  DISCHARGE DIAGNOSIS:  Active Problems:   Hypertension   HLD (hyperlipidemia)   Obesity   DKA (diabetic ketoacidosis) ([place])   AKI (acute kidney injury) ([place])   Dehydration   Metabolic acidosis due to diabetes mellitus ([place])   Sepsis (HCC)   Abscess and cellulitis of gluteal region   Elevated troponin   Reactive thrombocytosis   Hypophosphatemia   Necrotizing fasciitis ([place])   Uncontrolled type 2 diabetes mellitus with hyperglycemia (HCC)   NSTEMI (non-ST elevated myocardial infarction) (HCC)   Obesity, Class III, BMI 40-49.9 (morbid obesity) ([place])   Fungal infection   Weakness   SECONDARY DIAGNOSIS:   Past Medical History:  Diagnosis Date  . Hyperlipidemia   . Hypertension   . Obesity     HOSPITAL COURSE:   1.  Clinical sepsis, present on admission with necrotizing fasciitis of the buttock and perianal area.  Patient was seen in consultation by general surgery and had 3 debridements here in the hospital.  The patient initially had a wound VAC but now on wet-to-dry dressings.  Organisms growing E. coli, group B strep and Bacteroides.  Here in the hospital was switched over to IV cefazolin and metronidazole by infectious disease.  Upon discharge will give 2 weeks of Keflex and metronidazole.  Patient will also get a probiotic.  Family came into today to learn how to do dressing changes.  Unfortunately with her insurance they did not cover rehab or home  health.  Follow-up weekly with Edison Simon from general surgery.  Foley to stay in for right now.  If general surgery wants to take the Foley out they can do that in the office.  If not it will have to be changed by urology in about 3 weeks. 2.  Initial diabetic ketoacidosis, uncontrolled type 2 diabetes mellitus with hyperglycemia.  Prescribed 70/30 insulin 28 units in the morning and 10 units at dinner. 3.  NSTEMI.  This occurred in the setting of sepsis and DKA.  Continue aspirin, Lipitor and metoprolol.  Can follow[facility] with [facility] as outpatient. 4.  Obesity with a BMI of 41.53 5.  Weakness.  Unfortunately insurance company will not cover rehab or home health. 6.  Acute kidney injury with a creatinine of 1.42 on presentation.  This has improved to 0.56 upon discharge home 7.  Hypophosphatemia this had been replaced 8.  Hypokalemia replaced during hospital course also.  DISCHARGE CONDITIONS:   Satisfactory  CONSULTS OBTAINED:  Treatment Team:  Tsosie Billing, MD  General surgery  DRUG ALLERGIES:   Allergies  Allergen Reactions  . Bee Venom Anaphylaxis  . Ciprofloxacin Itching  . Lisinopril     Cough     DISCHARGE MEDICATIONS:   Allergies as of 05/12/2020      Reactions   Bee Venom Anaphylaxis   Ciprofloxacin Itching   Lisinopril    Cough      Medication List    TAKE these medications   acidophilus Caps capsule 2 capsules daily (okay to substitute any generic  probiotic)   aspirin 81 MG EC tablet Take 1 tablet (81 mg total) by mouth daily. Swallow whole.   atorvastatin 20 MG tablet Commonly known as: LIPITOR Take 1 tablet (20 mg total) by mouth daily.   blood glucose meter kit and supplies Kit Dispense based on patient and insurance preference. Use up to four times daily as directed. (FOR ICD-9 250.00, 250.01).   cephALEXin 500 MG capsule Commonly known as: KEFLEX Take 1 capsule (500 mg total) by mouth 3 (three) times daily for 14 days.    glucose blood test strip Use as instructed   insulin isophane & regular human (70-30) 100 UNIT/ML KwikPen Commonly known as: HUMULIN 70/30 MIX 28 units subcutaneous injection at breakfast and 10 units subcutaneous injection at dinner   Insulin Pen Needle 33G X 8 MM Misc 1 Dose by Does not apply route 2 (two) times daily.   metoprolol succinate 25 MG 24 hr tablet Commonly known as: TOPROL-XL Take 0.5 tablets (12.5 mg total) by mouth daily.   metroNIDAZOLE 500 MG tablet Commonly known as: FLAGYL Take 1 tablet (500 mg total) by mouth every 8 (eight) hours for 14 days.   nutrition supplement (JUVEN) Pack Take 1 packet by mouth 2 (two) times daily between meals.   Ensure Max Protein Liqd Take 330 mLs (11 oz total) by mouth 2 (two) times daily between meals.   oxyCODONE-acetaminophen 5-325 MG tablet Commonly known as: PERCOCET/ROXICET Take 1 tablet by mouth every 6 (six) hours as needed for severe pain.            Durable Medical Equipment  (From admission, onward)         Start     Ordered   05/08/20 1732  DME Glucometer  Once        05/08/20 1731           DISCHARGE INSTRUCTIONS:   Follow-up PCP as scheduled Follow-up general surgery weekly Follow-up urology 3 weeks either change Foley or take out if wound doing better.  If you experience worsening of your admission symptoms, develop shortness of breath, life threatening emergency, suicidal or homicidal thoughts you must seek medical attention immediately by calling 911 or calling your MD immediately  if symptoms less severe.  You Must read complete instructions/literature along with all the possible adverse reactions/side effects for all the Medicines you take and that have been prescribed to you. Take any new Medicines after you have completely understood and accept all the possible adverse reactions/side effects.   Please note  You were cared for by a hospitalist during your hospital stay. If you have any  questions about your discharge medications or the care you received while you were in the hospital after you are discharged, you can call the unit and asked to speak with the hospitalist on call if the hospitalist that took care of you is not available. Once you are discharged, your primary care physician will handle any further medical issues. Please note that NO REFILLS for any discharge medications will be authorized once you are discharged, as it is imperative that you return to your primary care physician (or establish a relationship with a primary care physician if you do not have one) for your aftercare needs so that they can reassess your need for medications and monitor your lab values.    Today   CHIEF COMPLAINT:   Chief Complaint  Patient presents with  . Nausea    w/ vomiting, excessive thirst x past few days.  Blood sugar 404 no known diabetes    HISTORY OF PRESENT ILLNESS:  Michelle Stark  is a 64 y.o. female came in very thirsty and found to have elevated blood sugars   VITAL SIGNS:  Blood pressure 118/66, pulse 81, temperature 97.8 F (36.6 C), temperature source Oral, resp. rate 17, height _0  (1.575 m), weight 103 kg, SpO2 96 %.  PHYSICAL EXAMINATION:  GENERAL:  64 y.o.-year-old patient lying in the bed with no acute distress.  EYES: Pupils equal, round, reactive to light and accommodation. No scleral icterus. Extraocular muscles intact.  HEENT: Head atraumatic, normocephalic. Oropharynx and nasopharynx clear.  LUNGS: Normal breath sounds bilaterally, no wheezing, rales,rhonchi or crepitation. No use of accessory muscles of respiration.  CARDIOVASCULAR: S1, S2 normal. No murmurs, rubs, or gallops.  ABDOMEN: Soft, non-tender, non-distended.  EXTREMITIES: No pedal edema.  NEUROLOGIC: Cranial nerves II through XII are intact. Muscle strength 5/5 in all extremities. Sensation intact. Gait not checked.  PSYCHIATRIC: The patient is alert and oriented x 3.  SKIN: Wound  covered  DATA REVIEW:   CBC Recent Labs  Lab 05/09/20 0934  WBC 10.8*  HGB 10.6*  HCT 31.4*  PLT 277    Chemistries  Recent Labs  Lab 05/06/20 0557 05/06/20 0557 05/09/20 0934  NA 135   < > 135  K 4.3   < > 3.9  CL 100   < > 99  CO2 26   < > 27  GLUCOSE 254*   < > 182*  BUN 12   < > 7*  CREATININE 0.57   < > 0.56  CALCIUM 8.1*   < > 8.4*  MG 1.9  --   --    < > = values in this interval not displayed.    Microbiology Results  Results for orders placed or performed during the hospital encounter of 04/29/20  Respiratory Panel by RT PCR (Flu A&B, Covid) - Nasopharyngeal Swab     Status: None   Collection Time: 04/29/20  3:05 PM   Specimen: Nasopharyngeal Swab  Result Value Ref Range Status   SARS Coronavirus 2 by RT PCR NEGATIVE NEGATIVE Final    Comment: (NOTE) SARS-CoV-2 target nucleic acids are NOT DETECTED.  The SARS-CoV-2 RNA is generally detectable in upper respiratoy specimens during the acute phase of infection. The lowest concentration of SARS-CoV-2 viral copies this assay can detect is 131 copies/mL. A negative result does not preclude SARS-Cov-2 infection and should not be used as the sole basis for treatment or other patient management decisions. A negative result may occur with  improper specimen collection/handling, submission of specimen other than nasopharyngeal swab, presence of viral mutation(s) within the areas targeted by this assay, and inadequate number of viral copies (<131 copies/mL). A negative result must be combined with clinical observations, patient history, and epidemiological information. The expected result is Negative.  Fact Sheet for Patients:  PinkCheek.be  Fact Sheet for Healthcare Providers:  GravelBags.it  This test is no t yet approved or cleared by the [place] FDA and  has been authorized for detection and/or diagnosis of SARS-CoV-2 by FDA under an  Emergency Use Authorization (EUA). This EUA will remain  in effect (meaning this test can be used) for the duration of the COVID-19 declaration under Section 564(b)(1) of the Act, 21 U.S.C. section 360bbb-3(b)(1), unless the authorization is terminated or revoked sooner.     Influenza A by PCR NEGATIVE NEGATIVE Final   Influenza B by PCR NEGATIVE NEGATIVE Final  Comment: (NOTE) The Xpert Xpress SARS-CoV-2/FLU/RSV assay is intended as an aid in  the diagnosis of influenza from Nasopharyngeal swab specimens and  should not be used as a sole basis for treatment. Nasal washings and  aspirates are unacceptable for Xpert Xpress SARS-CoV-2/FLU/RSV  testing.  Fact Sheet for Patients: PinkCheek.be  Fact Sheet for Healthcare Providers: GravelBags.it  This test is not yet approved or cleared by the [place] FDA and  has been authorized for detection and/or diagnosis of SARS-CoV-2 by  FDA under an Emergency Use Authorization (EUA). This EUA will remain  in effect (meaning this test can be used) for the duration of the  Covid-19 declaration under Section 564(b)(1) of the Act, 21  U.S.C. section 360bbb-3(b)(1), unless the authorization is  terminated or revoked. Performed at [facility], [address] [place], [place] [address]   Blood culture (routine x 2)     Status: None   Collection Time: 04/29/20  3:39 PM   Specimen: BLOOD  Result Value Ref Range Status   Specimen Description BLOOD BLOOD LEFT FOREARM  Final   Special Requests   Final    BOTTLES DRAWN AEROBIC AND ANAEROBIC Blood Culture adequate volume   Culture   Final    NO GROWTH 5 DAYS Performed at [facility], [address], [place], [place] [address]    Report Status 05/04/2020 FINAL  Final  Blood culture (routine x 2)     Status: None   Collection Time: 04/29/20  3:44 PM   Specimen: BLOOD  Result Value Ref Range Status   Specimen  Description BLOOD BLOOD RIGHT FOREARM  Final   Special Requests   Final    BOTTLES DRAWN AEROBIC AND ANAEROBIC Blood Culture adequate volume   Culture   Final    NO GROWTH 5 DAYS Performed at [facility], [address], [place], [place] [address]    Report Status 05/04/2020 FINAL  Final  Urine culture     Status: Abnormal   Collection Time: 04/29/20  9:00 PM   Specimen: Urine, Catheterized  Result Value Ref Range Status   Specimen Description   Final    URINE, CATHETERIZED Performed at [facility], [address], [place], [place] [address]    Special Requests   Final    NONE Performed at [facility], [address] [place], [place] [address]    Culture MULTIPLE SPECIES PRESENT, SUGGEST RECOLLECTION (A)  Final   Report Status 05/01/2020 FINAL  Final  Aerobic/Anaerobic Culture (surgical/deep wound)     Status: None   Collection Time: 04/30/20  2:35 AM   Specimen: PATH Other; Wound  Result Value Ref Range Status   Specimen Description   Final    PERIRECTAL ABSCESS Performed at [facility], [address] [place], [place] [address]    Special Requests   Final    NONE Performed at [facility], [place], [place] [address]    Gram Stain   Final    ABUNDANT WBC PRESENT, PREDOMINANTLY PMN ABUNDANT GRAM POSITIVE COCCI ABUNDANT GRAM NEGATIVE RODS ABUNDANT GRAM NEGATIVE COCCI    Culture   Final    ABUNDANT ESCHERICHIA COLI ABUNDANT GROUP B STREP(S.AGALACTIAE)ISOLATED TESTING AGAINST S. AGALACTIAE NOT ROUTINELY PERFORMED DUE TO PREDICTABILITY OF AMP/PEN/VAN SUSCEPTIBILITY. ABUNDANT BACTEROIDES FRAGILIS MODERATE PREVOTELLA SPECIES BETA LACTAMASE POSITIVE Performed at [facility], [place] [address], [place], [place] [address]    Report Status 05/03/2020 FINAL  Final  Organism ID, Bacteria ESCHERICHIA COLI  Final      Susceptibility   Escherichia coli - MIC*    AMPICILLIN >=32 RESISTANT Resistant      CEFAZOLIN <=4 SENSITIVE Sensitive     CEFEPIME <=0.12 SENSITIVE Sensitive     CEFTAZIDIME <=1 SENSITIVE Sensitive     CEFTRIAXONE <=0.25 SENSITIVE Sensitive     CIPROFLOXACIN 0.5 SENSITIVE Sensitive     GENTAMICIN <=1 SENSITIVE Sensitive     IMIPENEM 0.5 SENSITIVE Sensitive     TRIMETH/SULFA <=20 SENSITIVE Sensitive     AMPICILLIN/SULBACTAM 16 INTERMEDIATE Intermediate     PIP/TAZO <=4 SENSITIVE Sensitive     * ABUNDANT ESCHERICHIA COLI  Aerobic/Anaerobic Culture (surgical/deep wound)     Status: None   Collection Time: 04/30/20  2:35 AM   Specimen: PATH Other; Wound  Result Value Ref Range Status   Specimen Description   Final    PERIRECTAL ABSCESS Performed at [facility], [address] [place], [place] [address]    Special Requests   Final    NONE Performed at [facility], [address] [place], [place] [address]    Gram Stain   Final    MODERATE WBC PRESENT, PREDOMINANTLY PMN ABUNDANT GRAM NEGATIVE RODS ABUNDANT GRAM POSITIVE COCCI ABUNDANT GRAM NEGATIVE COCCI    Culture   Final    ABUNDANT ESCHERICHIA COLI ABUNDANT GROUP B STREP(S.AGALACTIAE)ISOLATED TESTING AGAINST S. AGALACTIAE NOT ROUTINELY PERFORMED DUE TO PREDICTABILITY OF AMP/PEN/VAN SUSCEPTIBILITY. ABUNDANT BACTEROIDES FRAGILIS FEW PREVOTELLA SPECIES BETA LACTAMASE POSITIVE Performed at [facility], [place] [address] [place], [place] [address]    Report Status 05/03/2020 FINAL  Final   Organism ID, Bacteria ESCHERICHIA COLI  Final      Susceptibility   Escherichia coli - MIC*    AMPICILLIN >=32 RESISTANT Resistant     CEFAZOLIN <=4 SENSITIVE Sensitive     CEFEPIME <=0.12 SENSITIVE Sensitive     CEFTAZIDIME <=1 SENSITIVE Sensitive     CEFTRIAXONE <=0.25 SENSITIVE Sensitive     CIPROFLOXACIN <=0.25 SENSITIVE Sensitive     GENTAMICIN <=1 SENSITIVE Sensitive     IMIPENEM <=0.25 SENSITIVE Sensitive     TRIMETH/SULFA <=20 SENSITIVE Sensitive     AMPICILLIN/SULBACTAM 16  INTERMEDIATE Intermediate     PIP/TAZO <=4 SENSITIVE Sensitive     * ABUNDANT ESCHERICHIA COLI  Aerobic/Anaerobic Culture (surgical/deep wound)     Status: None   Collection Time: 04/30/20  2:35 AM   Specimen: PATH Other; Tissue  Result Value Ref Range Status   Specimen Description   Final    PERIRECTAL ABSCESS Performed at [facility], [address] [place], [place] [address]    Special Requests   Final    NONE Performed at [facility], [address] [place], [place] [address]    Gram Stain   Final    FEW WBC PRESENT, PREDOMINANTLY PMN ABUNDANT GRAM POSITIVE COCCI ABUNDANT GRAM NEGATIVE RODS    Culture   Final    FEW ESCHERICHIA COLI ABUNDANT GROUP B STREP(S.AGALACTIAE)ISOLATED TESTING AGAINST S. AGALACTIAE NOT ROUTINELY PERFORMED DUE TO PREDICTABILITY OF AMP/PEN/VAN SUSCEPTIBILITY. ABUNDANT BACTEROIDES FRAGILIS MODERATE PREVOTELLA SPECIES BETA LACTAMASE POSITIVE Performed at [facility], [place] [address], [place], [place] [address]    Report Status 05/03/2020 FINAL  Final   Organism ID, Bacteria ESCHERICHIA COLI  Final      Susceptibility   Escherichia coli - MIC*    AMPICILLIN >=32 RESISTANT Resistant     CEFAZOLIN <=4 SENSITIVE Sensitive  CEFEPIME <=0.12 SENSITIVE Sensitive     CEFTAZIDIME <=1 SENSITIVE Sensitive     CEFTRIAXONE <=0.25 SENSITIVE Sensitive     CIPROFLOXACIN <=0.25 SENSITIVE Sensitive     GENTAMICIN <=1 SENSITIVE Sensitive     IMIPENEM 0.5 SENSITIVE Sensitive     TRIMETH/SULFA <=20 SENSITIVE Sensitive     AMPICILLIN/SULBACTAM 16 INTERMEDIATE Intermediate     PIP/TAZO <=4 SENSITIVE Sensitive     * FEW ESCHERICHIA COLI  MRSA PCR Screening     Status: None   Collection Time: 04/30/20  8:27 AM   Specimen: Nasal Mucosa; Nasopharyngeal  Result Value Ref Range Status   MRSA by PCR NEGATIVE NEGATIVE Final    Comment:        The GeneXpert MRSA Assay (FDA approved for NASAL specimens only), is one component of  a comprehensive MRSA colonization surveillance program. It is not intended to diagnose MRSA infection nor to guide or monitor treatment for MRSA infections. Performed at [facility], [address] [place], [place] [address]   Aerobic Culture (superficial specimen)     Status: None   Collection Time: 04/30/20  9:44 PM   Specimen: Leg  Result Value Ref Range Status   Specimen Description   Final    LEG Performed at [facility], [address], [place], [place] [address]    Special Requests   Final    NONE Performed at [facility], [address] [place], [place] [address]    Gram Stain   Final    NO WBC SEEN MODERATE GRAM POSITIVE COCCI RARE YEAST Performed at [facility], [place] [address] [place], [place] [address]    Culture   Final    FEW GROUP B STREP(S.AGALACTIAE)ISOLATED TESTING AGAINST S. AGALACTIAE NOT ROUTINELY PERFORMED DUE TO PREDICTABILITY OF AMP/PEN/VAN SUSCEPTIBILITY. FEW CANDIDA PARAPSILOSIS    Report Status 05/05/2020 FINAL  Final    Management plans discussed with the patient, and she has in agreement.  CODE STATUS:     Code Status Orders  (From admission, onward)         Start     Ordered   04/30/20 0433  Full code  Continuous        04/30/20 0433        Code Status History    This patient has a current code status but no historical code status.   Advance Care Planning Activity      TOTAL TIME TAKING CARE OF THIS PATIENT: 35 minutes.    Loletha Grayer M.D on 05/12/2020 at 3:55 PM  Between 7am to 6pm - Pager - [phone]  After 6pm go to [email or web address] - password [email or web address]  [facility]  CC: Primary care physician; Dr Waunita Schooner

## 2020-05-12 NOTE — Progress Notes (Signed)
PT Cancellation Note  Patient Details Name: ENRIKA AGUADO MRN: [id number] DOB: 05/22/1956   Cancelled Treatment:    Reason Eval/Treat Not Completed: Patient declined, no reason specified (Pt reports staff enroute to training caregivers on wound dressing. Will attempt again at later date/time.)  1:24 PM, 05/12/20 Rosamaria Lints, PT, DPT Physical Therapist - [facility]  [phone] (ASCOM)    Danese Dorsainvil C 05/12/2020, 1:24 PM

## 2020-05-12 NOTE — Progress Notes (Signed)
Michelle Stark to be D/C'd Home per MD order.  Discussed prescriptions and follow up appointments with the patient. Prescriptions given to patient, medication list explained in detail. Pt verbalized understanding.  Allergies as of 05/12/2020      Reactions   Bee Venom Anaphylaxis   Ciprofloxacin Itching   Lisinopril    Cough      Medication List    TAKE these medications   acidophilus Caps capsule 2 capsules daily (okay to substitute any generic probiotic)   aspirin 81 MG EC tablet Take 1 tablet (81 mg total) by mouth daily. Swallow whole.   atorvastatin 20 MG tablet Commonly known as: LIPITOR Take 1 tablet (20 mg total) by mouth daily.   blood glucose meter kit and supplies Kit Dispense based on patient and insurance preference. Use up to four times daily as directed. (FOR ICD-9 250.00, 250.01).   cephALEXin 500 MG capsule Commonly known as: KEFLEX Take 1 capsule (500 mg total) by mouth 3 (three) times daily for 14 days.   glucose blood test strip Use as instructed   insulin isophane & regular human (70-30) 100 UNIT/ML KwikPen Commonly known as: HUMULIN 70/30 MIX 28 units subcutaneous injection at breakfast and 10 units subcutaneous injection at dinner   Insulin Pen Needle 33G X 8 MM Misc 1 Dose by Does not apply route 2 (two) times daily.   metoprolol succinate 25 MG 24 hr tablet Commonly known as: TOPROL-XL Take 0.5 tablets (12.5 mg total) by mouth daily.   metroNIDAZOLE 500 MG tablet Commonly known as: FLAGYL Take 1 tablet (500 mg total) by mouth every 8 (eight) hours for 14 days.   nutrition supplement (JUVEN) Pack Take 1 packet by mouth 2 (two) times daily between meals.   Ensure Max Protein Liqd Take 330 mLs (11 oz total) by mouth 2 (two) times daily between meals.   oxyCODONE-acetaminophen 5-325 MG tablet Commonly known as: PERCOCET/ROXICET Take 1 tablet by mouth every 6 (six) hours as needed for severe pain.            Durable Medical  Equipment  (From admission, onward)         Start     Ordered   05/08/20 1732  DME Glucometer  Once        05/08/20 1731          Vitals:   05/12/20 0331 05/12/20 0853  BP: 110/60 118/66  Pulse: 82 81  Resp: 17   Temp: 98.7 F (37.1 C) 97.8 F (36.6 C)  SpO2: 94% 96%    Skin clean, dry and intact without evidence of skin break down, no evidence of skin tears noted. IV catheter discontinued intact. Site without signs and symptoms of complications. Dressing and pressure applied. Pt denies pain at this time. No complaints noted.  An After Visit Summary was printed and given to the patient. Patient escorted via Meta, and D/C home via private auto.  Fuller Mandril, RN

## 2020-05-12 NOTE — TOC Transition Note (Signed)
Transition of Care [facility]) - CM/SW Discharge Note   Patient Details  Name: Michelle Stark MRN: [id number] Date of Birth: 06-10-1956  [facility]) CM/SW Contact:  Chapman Fitch, RN Phone Number: 05/12/2020, 5:03 PM   Clinical Narrative:        Patient discharge today.  TOC to document full note tomorrow      Patient Goals and CMS Choice        Discharge Placement                       Discharge Plan and Services   Discharge Planning Services: CM Consult                                 Social Determinants of Health (SDOH) Interventions     Readmission Risk Interventions No flowsheet data found.

## 2020-05-13 NOTE — TOC Transition Note (Signed)
Transition of Care [facility]) - CM/SW Discharge Note   Patient Details  Name: EMMALEE SOLIVAN MRN: [id number] Date of Birth: 1956-03-04  [facility]) CM/SW Contact:  Chapman Fitch, RN Phone Number: 05/13/2020, 4:34 PM   Clinical Narrative:      Patient was discharged home 11/1.  Niece Jill Side, and daughter in law Rinaldo Cloud came to room for education on dressing changes. Per nursing staff family did well with the dressing changes.  Bedside RN provided dressing changes supplies for discharge. And family is aware to purchase additional outpatient.    Discussed options of private pay home health nurse, as well as a letter of guarantee from the hospital to pay for home health services.  Both patient and son Rusty Decline.  I have updated the medical team as well as TOC supervisor.   Informed that patient does not have insurance coverage for [facility] or RW.  Per daughter in law patient has a RW in the home.  Her bathroom is close to where she will be sitting and should not need a BSC.  Patient in agreement.  Should she need one after discharge the will purchase privately.   PCP appointment for 12/28 - MD has written for 2 months of prescriptions.  Patient has follow up appointment outpatient with surgery 11/9 for dressing changed         Patient Goals and CMS Choice        Discharge Placement                       Discharge Plan and Services   Discharge Planning Services: CM Consult                                 Social Determinants of Health (SDOH) Interventions     Readmission Risk Interventions No flowsheet data found.

## 2020-05-20 ENCOUNTER — Encounter: Payer: No Typology Code available for payment source | Admitting: Physician Assistant

## 2020-05-23 ENCOUNTER — Encounter: Payer: Self-pay | Admitting: Physician Assistant

## 2020-05-23 ENCOUNTER — Ambulatory Visit (INDEPENDENT_AMBULATORY_CARE_PROVIDER_SITE_OTHER): Payer: No Typology Code available for payment source | Admitting: Physician Assistant

## 2020-05-23 ENCOUNTER — Telehealth: Payer: Self-pay | Admitting: *Deleted

## 2020-05-23 ENCOUNTER — Other Ambulatory Visit: Payer: Self-pay

## 2020-05-23 VITALS — BP 155/84 | HR 106 | Temp 98.1°F | Ht 61.5 in | Wt 220.0 lb

## 2020-05-23 DIAGNOSIS — Z09 Encounter for follow-up examination after completed treatment for conditions other than malignant neoplasm: Secondary | ICD-10-CM

## 2020-05-23 DIAGNOSIS — M726 Necrotizing fasciitis: Secondary | ICD-10-CM

## 2020-05-23 NOTE — Progress Notes (Signed)
[facility] POST-OP OFFICE VISIT  05/23/2020  HPI: Michelle Stark is a 64 y.o. female 23 days s/p initial incision and drainage of right perineal soft tissue infection involving the skin and the soft tissue and fascia. For necrotizing infection with Dr Everlene Farrier which required two additional takebacks. Initially attempted wound vac placement but this failed given location of the wound. She was ultimately discharge on 11/01 to SNF with wet-to-dry dressings.   Today, she presents with her son and daughter in law who have been helping with her care at home. She is doing well. No major pain issues. No fever, chills, nausea, emesis. She has continued her foley for urinary divergence. They have been doing once daily wet to dry dressing and as needed with stooling. She has continued ABx. No other issues  Vital signs: BP (!) 155/84   Pulse (!) 106   Temp 98.1 F (36.7 C) (Oral)   Ht 5' 1.5" (1.562 m)   Wt 220 lb (99.8 kg)   SpO2 96%   BMI 40.90 kg/m    Physical Exam: Constitutional: Well appearing female, NAD Genitourinary: Foley in place Skin: There is an approximately 5 x 5 cm wound just to the right of the anal opening, the wound bed is 100% granulation tissue, this does track about 2-3 cm laterally, no erythema or purulence present.   Assessment/Plan: This is a 64 y.o. female 23 days s/p initial incision and drainage of right perineal soft tissue infection involving the skin and the soft tissue and fascia. For necrotizing infection with Dr Everlene Farrier which required two additional takebacks.   - Continue wound care with daily + prn wet to dry dressing. They are doing a great job with this at home  - Continue good hygiene, sitz baths prn  - Complete ABx  - Continue foley; follow up with urology as scheduled  - rtc in ~1 week for re-check   -- Lynden Oxford, PA-C [facility] 05/23/2020, 10:36 AM [phone] M-F: 7am - 4pm

## 2020-05-23 NOTE — Patient Instructions (Addendum)
Zach recommends patient to follow up with Urologist office for the Foley Bag. Ian Malkin discussed with patient and care-giver about good hygiene in the area of concern.  Wound Care, Adult Taking care of your wound properly can help to prevent pain, infection, and scarring. It can also help your wound to heal more quickly. How to care for your wound Wound care      Follow instructions from your health care provider about how to take care of your wound. Make sure you: ? Wash your hands with soap and water before you change the bandage (dressing). If soap and water are not available, use hand sanitizer. ? Change your dressing as told by your health care provider. ? Leave stitches (sutures), skin glue, or adhesive strips in place. These skin closures may need to stay in place for 2 weeks or longer. If adhesive strip edges start to loosen and curl up, you may trim the loose edges. Do not remove adhesive strips completely unless your health care provider tells you to do that.  Check your wound area every day for signs of infection. Check for: ? Redness, swelling, or pain. ? Fluid or blood. ? Warmth. ? Pus or a bad smell.  Ask your health care provider if you should clean the wound with mild soap and water. Doing this may include: ? Using a clean towel to pat the wound dry after cleaning it. Do not rub or scrub the wound. ? Applying a cream or ointment. Do this only as told by your health care provider. ? Covering the incision with a clean dressing.  Ask your health care provider when you can leave the wound uncovered.  Keep the dressing dry until your health care provider says it can be removed. Do not take baths, swim, use a hot tub, or do anything that would put the wound underwater until your health care provider approves. Ask your health care provider if you can take showers. You may only be allowed to take sponge baths. Medicines   If you were prescribed an antibiotic medicine, cream, or  ointment, take or use the antibiotic as told by your health care provider. Do not stop taking or using the antibiotic even if your condition improves.  Take over-the-counter and prescription medicines only as told by your health care provider. If you were prescribed pain medicine, take it 30 or more minutes before you do any wound care or as told by your health care provider. General instructions  Return to your normal activities as told by your health care provider. Ask your health care provider what activities are safe.  Do not scratch or pick at the wound.  Do not use any products that contain nicotine or tobacco, such as cigarettes and e-cigarettes. These may delay wound healing. If you need help quitting, ask your health care provider.  Keep all follow-up visits as told by your health care provider. This is important.  Eat a diet that includes protein, vitamin A, vitamin C, and other nutrient-rich foods to help the wound heal. ? Foods rich in protein include meat, dairy, beans, nuts, and other sources. ? Foods rich in vitamin A include carrots and dark green, leafy vegetables. ? Foods rich in vitamin C include citrus, tomatoes, and other fruits and vegetables. ? Nutrient-rich foods have protein, carbohydrates, fat, vitamins, or minerals. Eat a variety of healthy foods including vegetables, fruits, and whole grains. Contact a health care provider if:  You received a tetanus shot and you have swelling,  severe pain, redness, or bleeding at the injection site.  Your pain is not controlled with medicine.  You have redness, swelling, or pain around the wound.  You have fluid or blood coming from the wound.  Your wound feels warm to the touch.  You have pus or a bad smell coming from the wound.  You have a fever or chills.  You are nauseous or you vomit.  You are dizzy. Get help right away if:  You have a red streak going away from your wound.  The edges of the wound open up  and separate.  Your wound is bleeding, and the bleeding does not stop with gentle pressure.  You have a rash.  You faint.  You have trouble breathing. Summary  Always wash your hands with soap and water before changing your bandage (dressing).  To help with healing, eat foods that are rich in protein, vitamin A, vitamin C, and other nutrients.  Check your wound every day for signs of infection. Contact your health care provider if you suspect that your wound is infected. This information is not intended to replace advice given to you by your health care provider. Make sure you discuss any questions you have with your health care provider. Document Revised: 10/16/2018 Document Reviewed: 01/13/2016 Elsevier Patient Education  2020 [employer].   How to Take a [employer] A sitz bath is a warm water bath that may be used to care for your rectum, genital area, or the area between your rectum and genitals (perineum). For a sitz bath, the water only comes up to your hips and covers your buttocks. A sitz bath may done at home in a bathtub or with a portable sitz bath that fits over the toilet. Your health care provider may recommend a sitz bath to help: Relieve pain and discomfort after delivering a baby. Relieve pain and itching from hemorrhoids or anal fissures. Relieve pain after certain surgeries. Relax muscles that are sore or tight. How to take a sitz bath Take 3-4 sitz baths a day, or as many as told by your health care provider. Bathtub sitz bath To take a sitz bath in a bathtub: Partially fill a bathtub with warm water. The water should be deep enough to cover your hips and buttocks when you are sitting in the tub. If your health care provider told you to put medicine in the water, follow his or her instructions. Sit in the water. Open the tub drain a little, and leave it open during your bath. Turn on the warm water again, enough to replace the water that is draining out. Keep  the water running throughout your bath. This helps keep the water at the right level and the right temperature. Soak in the water for 15-20 minutes, or as long as told by your health care provider. When you are done, be careful when you stand up. You may feel dizzy. After the sitz bath, pat yourself dry. Do not rub your skin to dry it.  Over-the-toilet sitz bath To take a sitz bath with an over-the-toilet basin: Follow the manufacturer's instructions. Fill the basin with warm water. If your health care provider told you to put medicine in the water, follow his or her instructions. Sit on the seat. Make sure the water covers your buttocks and perineum. Soak in the water for 15-20 minutes, or as long as told by your health care provider. After the sitz bath, pat yourself dry. Do not rub your skin  to dry it. Clean and dry the basin between uses. Discard the basin if it cracks, or according to the manufacturer's instructions. Contact a health care provider if: Your symptoms get worse. Do not continue with sitz baths if your symptoms get worse. You have new symptoms. If this happens, do not continue with sitz baths until you talk with your health care provider. Summary A sitz bath is a warm water bath in which the water only comes up to your hips and covers your buttocks. A sitz bath may help relieve itching, relieve pain, and relax muscles that are sore or tight in the lower part of your body, including your genital area. Take 3-4 sitz baths a day, or as many as told by your health care provider. Soak in the water for 15-20 minutes. Do not continue with sitz baths if your symptoms get worse. This information is not intended to replace advice given to you by your health care provider. Make sure you discuss any questions you have with your health care provider. Document Revised: 11/27/2018 Document Reviewed: 06/30/2017 Elsevier Patient Education  2020 [employer].

## 2020-05-23 NOTE — Telephone Encounter (Signed)
Faxed FMLA Sons Shannelle Alguire paperwork to [phone]

## 2020-05-28 ENCOUNTER — Other Ambulatory Visit: Payer: Self-pay

## 2020-05-28 ENCOUNTER — Ambulatory Visit (INDEPENDENT_AMBULATORY_CARE_PROVIDER_SITE_OTHER): Payer: No Typology Code available for payment source | Admitting: Urology

## 2020-05-28 ENCOUNTER — Encounter: Payer: Self-pay | Admitting: Urology

## 2020-05-28 VITALS — BP 138/83 | HR 86 | Ht 61.5 in | Wt 220.0 lb

## 2020-05-28 DIAGNOSIS — Z8739 Personal history of other diseases of the musculoskeletal system and connective tissue: Secondary | ICD-10-CM | POA: Diagnosis not present

## 2020-05-28 NOTE — Progress Notes (Signed)
05/28/2020 1:55 PM   Michelle Stark 1955-09-19 [id number]  Referring provider: No referring provider defined for this encounter.  Chief Complaint  Patient presents with  . Urinary Retention    HPI: Michelle Stark is a 64 y.o. female scheduled today for Foley catheter change.   Admitted [facility] 04/29/2020 with necrotizing fasciitis of the perineal area and underwent initial debridement and 2 additional return to the OR for debridement  Foley catheter was placed to divert urinary stream  Currently undergoing dressing changes at home and following by [facility]  No voiding problems prior to catheter placement  PMH: Past Medical History:  Diagnosis Date  . Hyperlipidemia   . Hypertension   . Obesity     Surgical History: Past Surgical History:  Procedure Laterality Date  . CHOLECYSTECTOMY    . INCISION AND DRAINAGE ABSCESS N/A 04/30/2020   Procedure: INCISION AND DRAINAGE PERIRECTAL  ABSCESS;  Surgeon: Jules Husbands, MD;  Location: [facility] ORS;  Service: General;  Laterality: N/A;  . INCISION AND DRAINAGE ABSCESS N/A 05/02/2020   Procedure: INCISION AND DRAINAGE ABSCESS;  Surgeon: Jules Husbands, MD;  Location: [facility] ORS;  Service: General;  Laterality: N/A;  . INCISION AND DRAINAGE ABSCESS N/A 05/05/2020   Procedure: INCISION AND DRAINAGE PERINEAL ABSCESS;  Surgeon: Jules Husbands, MD;  Location: [facility] ORS;  Service: General;  Laterality: N/A;  . RHINOPLASTY      Home Medications:  Allergies as of 05/28/2020      Reactions   Bee Venom Anaphylaxis   Ciprofloxacin Itching   Lisinopril    Cough      Medication List       Accurate as of May 28, 2020  1:55 PM. If you have any questions, ask your nurse or doctor.        STOP taking these medications   atorvastatin 20 MG tablet Commonly known as: LIPITOR Stopped by: Abbie Sons, MD   metoprolol succinate 25 MG 24 hr tablet Commonly known as: TOPROL-XL Stopped by: Abbie Sons, MD     oxyCODONE-acetaminophen 5-325 MG tablet Commonly known as: PERCOCET/ROXICET Stopped by: Abbie Sons, MD     TAKE these medications   acidophilus Caps capsule 2 capsules daily (okay to substitute any generic probiotic)   aspirin 81 MG EC tablet Take 1 tablet (81 mg total) by mouth daily. Swallow whole.   blood glucose meter kit and supplies Kit Dispense based on patient and insurance preference. Use up to four times daily as directed. (FOR ICD-9 250.00, 250.01).   Ensure Max Protein Liqd Take 330 mLs (11 oz total) by mouth 2 (two) times daily between meals. What changed: Another medication with the same name was removed. Continue taking this medication, and follow the directions you see here. Changed by: Abbie Sons, MD   glucose blood test strip Use as instructed   insulin isophane & regular human (70-30) 100 UNIT/ML KwikPen Commonly known as: HUMULIN 70/30 MIX 28 units subcutaneous injection at breakfast and 10 units subcutaneous injection at dinner   Insulin Pen Needle 33G X 8 MM Misc 1 Dose by Does not apply route 2 (two) times daily.       Allergies:  Allergies  Allergen Reactions  . Bee Venom Anaphylaxis  . Ciprofloxacin Itching  . Lisinopril     Cough     Family History: Family History  Problem Relation Age of Onset  . Hypertension Mother   . Hypertension Father     Social  History:  reports that she has quit smoking. She has never used smokeless tobacco. She reports current alcohol use. She reports previous drug use.   Physical Exam: BP 138/83   Pulse 86   Ht 5' 1.5" (1.562 m)   Wt 220 lb (99.8 kg)   BMI 40.90 kg/m   Constitutional:  Alert and oriented, No acute distress. HEENT: [place] AT, moist mucus membranes.  Trachea midline, no masses. Cardiovascular: No clubbing, cyanosis, or edema. Respiratory: Normal respiratory effort, no increased work of breathing.    Assessment & Plan:    1.  Necrotizing fasciitis perineal area  Foley  catheter was changed  Will defer to general surgery on timing of catheter removal based on wound healing  Recommend catheter change 4-6 weeks but can hopefully be discontinued prior to that time   Abbie Sons, MD  [facility] [address] [place], [place] [address] [phone]

## 2020-06-02 ENCOUNTER — Encounter: Payer: Self-pay | Admitting: Physician Assistant

## 2020-06-02 ENCOUNTER — Ambulatory Visit (INDEPENDENT_AMBULATORY_CARE_PROVIDER_SITE_OTHER): Payer: No Typology Code available for payment source | Admitting: Physician Assistant

## 2020-06-02 ENCOUNTER — Other Ambulatory Visit: Payer: Self-pay

## 2020-06-02 VITALS — BP 147/81 | HR 86 | Temp 98.0°F | Ht 61.5 in | Wt 220.6 lb

## 2020-06-02 DIAGNOSIS — Z09 Encounter for follow-up examination after completed treatment for conditions other than malignant neoplasm: Secondary | ICD-10-CM

## 2020-06-02 DIAGNOSIS — M726 Necrotizing fasciitis: Secondary | ICD-10-CM

## 2020-06-02 NOTE — Patient Instructions (Signed)
Keep with the dressing changing daily as needed. Practice good hygiene. Follow up 1 week.  See appointment below.

## 2020-06-02 NOTE — Progress Notes (Signed)
[facility] POST-OP OFFICE VISIT  06/02/2020  HPI: Michelle Stark is a 64 y.o. female 33 days s/p initial incision and drainage of right perineal soft tissue infection involving the skin and the soft tissue and fascia for necrotizing infection with Dr Everlene Farrier which required two additional takebacks. Initially attempted wound vac placement but this failed given location of the wound  Today, she reports that she continues to do well with dressing changes at home. No fever, chills, drainage. She has continued her foley catheter for now for urinary divergence for wound healing. She was seen by Dr Lonna Cobb on 11/17 for this and catheter was exchanged at that time. Removal deferred until our evaluation for wound healing. She is anxious to get this out.   Vital signs: BP (!) 147/81   Pulse 86   Temp 98 F (36.7 C) (Oral)   Ht 5' 1.5" (1.562 m)   Wt 220 lb 9.6 oz (100.1 kg)   SpO2 97%   BMI 41.01 kg/m    Physical Exam: Constitutional: Well appearing female, NAD Genitourinary: Foley in place Skin: There is an approximately 4 x 3 cm wound just to the right of the anal opening, the wound bed is 100% granulation tissue, this does track about 2-3 cm superiorly, no erythema or purulence present.   Assessment/Plan: This is a 64 y.o. female  33 days s/p initial incision and drainage of right perineal soft tissue infection involving the skin and the soft tissue and fascia for necrotizing infection with Dr Everlene Farrier which required two additional takebacks   - Preformed wet-to-dry dressing change at bedside today. She will continue this at home as she has been  - I discussed potential foley removal with understanding that she will need to be able to independently void and preform good hygiene after voiding. We will defer removal for an additional week  - No need for additional ABx or intervention  - I will see her on a weekly basis for now to monitor wound and potential for foley  removal  -- Lynden Oxford, PA-C [facility] 06/02/2020, 11:31 AM [phone] M-F: 7am - 4pm

## 2020-06-09 ENCOUNTER — Encounter: Payer: Self-pay | Admitting: Surgery

## 2020-06-10 ENCOUNTER — Ambulatory Visit (INDEPENDENT_AMBULATORY_CARE_PROVIDER_SITE_OTHER): Payer: No Typology Code available for payment source | Admitting: Physician Assistant

## 2020-06-10 ENCOUNTER — Encounter: Payer: Self-pay | Admitting: Physician Assistant

## 2020-06-10 ENCOUNTER — Other Ambulatory Visit: Payer: Self-pay

## 2020-06-10 VITALS — BP 135/88 | HR 106 | Temp 97.9°F | Ht 61.0 in | Wt 215.0 lb

## 2020-06-10 DIAGNOSIS — M726 Necrotizing fasciitis: Secondary | ICD-10-CM

## 2020-06-10 DIAGNOSIS — Z09 Encounter for follow-up examination after completed treatment for conditions other than malignant neoplasm: Secondary | ICD-10-CM

## 2020-06-10 NOTE — Progress Notes (Signed)
[place] [facility] POST-OP OFFICE VISIT  06/10/2020  HPI: Michelle Stark is a 64 y.o. female >1 month s/p initial incision and drainageof right perineal soft tissue infection involving the skin and the soft tissue and fascia for necrotizing infection with Dr Everlene Farrier which required two additional takebacks. Initially attempted wound vac placement but this failed given location of the wound  She is doing well, she is anxious to get the catheter out No fever, chills She continues to do daily wet-to-dry dressing changes and as needed without issues No other complaints or concerns  Vital signs: BP 135/88   Pulse (!) 106   Temp 97.9 F (36.6 C) (Oral)   Ht 5\' 1"  (1.549 m)   Wt 215 lb (97.5 kg)   SpO2 96%   BMI 40.62 kg/m    Physical Exam: Constitutional:Well appearing female, NAD Genitourinary: Foley in place (we will remove this today) Skin:There is an approximately 3 x 2 cm wound just to the right of the anal opening, the wound bed is 100% granulation tissue, this does track about 2-3 cm superiorly, no erythema or purulence present.   Assessment/Plan: This is a 64 y.o. female >1 month s/p initial incision and drainageof right perineal soft tissue infection involving the skin and the soft tissue and fascia for necrotizing infection with Dr 77 which required two additional takebacks.   - I believe she will do fine at this point without foley catheter. We will remove this. I reviewed signs and symptoms of urinary retention as well as UTI  - Continue wet-to-dry dressings at home as she is doing  - No need for additional ABx or intervention  - We will transition to bi-weekly appointments  -- Everlene Farrier, PA-C [facility] 06/10/2020, 2:55 PM [phone] M-F: 7am - 4pm

## 2020-06-10 NOTE — Patient Instructions (Signed)
We will see you back in 2 weeks. See appointment below. Keep a clean dressing on wound until it heals completely.

## 2020-06-26 ENCOUNTER — Other Ambulatory Visit: Payer: Self-pay

## 2020-06-26 ENCOUNTER — Ambulatory Visit (INDEPENDENT_AMBULATORY_CARE_PROVIDER_SITE_OTHER): Payer: No Typology Code available for payment source | Admitting: Physician Assistant

## 2020-06-26 ENCOUNTER — Ambulatory Visit: Payer: No Typology Code available for payment source | Admitting: Physician Assistant

## 2020-06-26 ENCOUNTER — Encounter: Payer: Self-pay | Admitting: Physician Assistant

## 2020-06-26 VITALS — BP 169/109 | HR 104 | Temp 98.1°F | Ht 61.0 in | Wt 215.2 lb

## 2020-06-26 DIAGNOSIS — M726 Necrotizing fasciitis: Secondary | ICD-10-CM

## 2020-06-26 DIAGNOSIS — Z09 Encounter for follow-up examination after completed treatment for conditions other than malignant neoplasm: Secondary | ICD-10-CM

## 2020-06-26 NOTE — Progress Notes (Signed)
[place] [facility] POST-OP OFFICE VISIT  06/26/2020  HPI: Michelle Stark is a 64 y.o. female ~1.5 months s/p initial incision and drainageof right perineal soft tissue infection involving the skin and the soft tissue and fascia for necrotizing infection with Dr Everlene Farrier which required two additional takebacks. Initially attempted wound vac placement but this failed given location of the wound  She is doing very well Wound continues to heal She is using less and less packing, wound no longer tracking No fever, chills  Vital signs: There were no vitals taken for this visit.   Physical Exam: Constitutional:Well appearing female, NAD Skin:There is an approximately1.5 x 1cm wound just to the right of the anal opening, the wound bed is 100% granulation tissue, no tracking present  Assessment/Plan: This is a 64 y.o. female ~1.5 months s/p initial incision and drainageof right perineal soft tissue infection involving the skin and the soft tissue and fascia for necrotizing infection with Dr Everlene Farrier which required two additional takebacks. Initially attempted wound vac placement but this failed given location of the wound   - Transition to just superficial dressings daily now; no further need for packing             - I will see her again in ~1 month to ensure complete healing  -- Lynden Oxford, PA-C [facility] 06/26/2020, 12:01 PM [phone] M-F: 7am - 4pm\

## 2020-06-26 NOTE — Patient Instructions (Signed)
See your follow up appointment below.  If you have any concerns or questions, please feel free to call our office.

## 2020-06-27 ENCOUNTER — Encounter: Payer: Self-pay | Admitting: Physician Assistant

## 2020-07-08 ENCOUNTER — Encounter: Payer: Self-pay | Admitting: Family Medicine

## 2020-07-08 ENCOUNTER — Other Ambulatory Visit: Payer: Self-pay

## 2020-07-08 ENCOUNTER — Ambulatory Visit (INDEPENDENT_AMBULATORY_CARE_PROVIDER_SITE_OTHER): Payer: No Typology Code available for payment source | Admitting: Family Medicine

## 2020-07-08 VITALS — BP 142/84 | HR 85 | Temp 96.9°F | Ht 60.0 in | Wt 214.5 lb

## 2020-07-08 DIAGNOSIS — E1165 Type 2 diabetes mellitus with hyperglycemia: Secondary | ICD-10-CM | POA: Diagnosis not present

## 2020-07-08 DIAGNOSIS — Z1211 Encounter for screening for malignant neoplasm of colon: Secondary | ICD-10-CM

## 2020-07-08 DIAGNOSIS — E7800 Pure hypercholesterolemia, unspecified: Secondary | ICD-10-CM

## 2020-07-08 DIAGNOSIS — I10 Essential (primary) hypertension: Secondary | ICD-10-CM

## 2020-07-08 DIAGNOSIS — Z1159 Encounter for screening for other viral diseases: Secondary | ICD-10-CM

## 2020-07-08 LAB — CBC
HCT: 44.7 % (ref 36.0–46.0)
Hemoglobin: 15.1 g/dL — ABNORMAL HIGH (ref 12.0–15.0)
MCHC: 33.8 g/dL (ref 30.0–36.0)
MCV: 91.5 fl (ref 78.0–100.0)
Platelets: 309 10*3/uL (ref 150.0–400.0)
RBC: 4.88 Mil/uL (ref 3.87–5.11)
RDW: 13.2 % (ref 11.5–15.5)
WBC: 10 10*3/uL (ref 4.0–10.5)

## 2020-07-08 LAB — COMPREHENSIVE METABOLIC PANEL
ALT: 29 U/L (ref 0–35)
AST: 33 U/L (ref 0–37)
Albumin: 4.1 g/dL (ref 3.5–5.2)
Alkaline Phosphatase: 68 U/L (ref 39–117)
BUN: 18 mg/dL (ref 6–23)
CO2: 24 mEq/L (ref 19–32)
Calcium: 9.6 mg/dL (ref 8.4–10.5)
Chloride: 104 mEq/L (ref 96–112)
Creatinine, Ser: 0.64 mg/dL (ref 0.40–1.20)
GFR: 93.32 mL/min (ref >60.00)
Glucose, Bld: 171 mg/dL — ABNORMAL HIGH (ref 70–99)
Potassium: 4 mEq/L (ref 3.5–5.1)
Sodium: 138 mEq/L (ref 135–145)
Total Bilirubin: 0.4 mg/dL (ref 0.2–1.2)
Total Protein: 7.7 g/dL (ref 6.0–8.3)

## 2020-07-08 NOTE — Patient Instructions (Addendum)
Diabetes monitoring:  2 hours after a meal - Goal <180 Fasting Goal <140  You can call for a mammogram at these locations:  [facility].  [address] [place] [phone]  Bring bp readings to next visit   Restart statin medication - either bring to next office visit or call to let us know what you are taking

## 2020-07-08 NOTE — Assessment & Plan Note (Signed)
Reports normal bp at home. Discussed that she may benefit from ACE/ARB but will continue to monitor at this time. Bring log to next appointment

## 2020-07-08 NOTE — Progress Notes (Signed)
Subjective:     Michelle Stark is a 64 y.o. female presenting for Establish Care     HPI  #necrotizing fasciitis - healing well - no longer needing packing  - no longer with foley catheter   #Diabetes Currently taking insulin - Novolin - 70/30 -- 28 AM and 10 PM  Using medications without difficulties: No Hypoglycemic episodes:No  Hyperglycemic episodes:No  Feet problems:No  Blood Sugars averaging: 110-122 Last HgbA1c:  Lab Results  Component Value Date   HGBA1C 12.4 (H) 04/30/2020    Diabetes Health Maintenance Due:    Diabetes Health Maintenance Due  Topic Date Due  . FOOT EXAM  Never done  . OPHTHALMOLOGY EXAM  Never done  . URINE MICROALBUMIN  Never done  . HEMOGLOBIN A1C  10/29/2020    #HTN - typically high in the office - this morning 120/<90 (not sure the exact number - checks it every morning - niece is RN   Review of Systems   Social History   Tobacco Use  Smoking Status Former Smoker  . Years: 4.00  . Types: Cigarettes  Smokeless Tobacco Never Used        Objective:    BP Readings from Last 3 Encounters:  07/08/20 (!) 142/84  06/26/20 (!) 169/109  06/10/20 135/88   Wt Readings from Last 3 Encounters:  07/08/20 214 lb 8 oz (97.3 kg)  06/26/20 215 lb 3.2 oz (97.6 kg)  06/10/20 215 lb (97.5 kg)    BP (!) 142/84   Pulse 85   Temp (!) 96.9 F (36.1 C) (Temporal)   Ht 5' (1.524 m)   Wt 214 lb 8 oz (97.3 kg)   SpO2 98%   BMI 41.89 kg/m    Physical Exam Constitutional:      General: She is not in acute distress.    Appearance: She is well-developed. She is not diaphoretic.  HENT:     Right Ear: External ear normal.     Left Ear: External ear normal.     Nose: Nose normal.  Eyes:     Conjunctiva/sclera: Conjunctivae normal.  Cardiovascular:     Rate and Rhythm: Normal rate and regular rhythm.     Heart sounds: No murmur heard.   Pulmonary:     Effort: Pulmonary effort is normal. No respiratory distress.      Breath sounds: Normal breath sounds. No wheezing.  Musculoskeletal:     Cervical back: Neck supple.  Skin:    General: Skin is warm and dry.     Capillary Refill: Capillary refill takes less than 2 seconds.  Neurological:     Mental Status: She is alert. Mental status is at baseline.  Psychiatric:        Mood and Affect: Mood normal.        Behavior: Behavior normal.           Assessment & Plan:   Problem List Items Addressed This Visit      Cardiovascular and Mediastinum   Hypertension    Reports normal bp at home. Discussed that she may benefit from ACE/ARB but will continue to monitor at this time. Bring log to next appointment      Relevant Orders   Comprehensive metabolic panel   CBC     Endocrine   Uncontrolled type 2 diabetes mellitus with hyperglycemia (HCC) - Primary    Home CBG monitoring with good control. She is hesitant to try oral meds due to side effects and is tolerating  insulin well. Cont Novolin 70/30 28 units AM and 10 units PM. Continue to monitor CBG, restart cholesterol medication. Labs today. Return in 2 months for hgb a1c as too early today.       Relevant Orders   Comprehensive metabolic panel   Ambulatory referral to Ophthalmology     Other   HLD (hyperlipidemia)    Lab Results  Component Value Date   LDLCALC 167 (H) 04/30/2020   Discussed elevated LDL and need for statin. Restart statin and call to let me know what she is taking.        Other Visit Diagnoses    Screening for colon cancer       Relevant Orders   Fecal occult blood, imunochemical   Need for hepatitis C screening test       Relevant Orders   Hepatitis C antibody       Return in about 2 months (around 09/08/2020) for diabetes.  Lynnda Child, MD  This visit occurred during the SARS-CoV-2 public health emergency.  Safety protocols were in place, including screening questions prior to the visit, additional usage of staff PPE, and extensive cleaning of exam room  while observing appropriate contact time as indicated for disinfecting solutions.

## 2020-07-08 NOTE — Assessment & Plan Note (Signed)
Lab Results  Component Value Date   LDLCALC 167 (H) 04/30/2020   Discussed elevated LDL and need for statin. Restart statin and call to let me know what she is taking.

## 2020-07-08 NOTE — Assessment & Plan Note (Signed)
Home CBG monitoring with good control. She is hesitant to try oral meds due to side effects and is tolerating insulin well. Cont Novolin 70/30 28 units AM and 10 units PM. Continue to monitor CBG, restart cholesterol medication. Labs today. Return in 2 months for hgb a1c as too early today.

## 2020-07-09 LAB — HEPATITIS C ANTIBODY
Hepatitis C Ab: NONREACTIVE
SIGNAL TO CUT-OFF: 0.02 (ref <1.00)

## 2020-07-28 ENCOUNTER — Ambulatory Visit: Payer: No Typology Code available for payment source | Admitting: Physician Assistant

## 2020-07-29 ENCOUNTER — Ambulatory Visit: Payer: No Typology Code available for payment source | Admitting: Physician Assistant

## 2020-08-04 ENCOUNTER — Encounter: Payer: Self-pay | Admitting: Physician Assistant

## 2020-08-04 ENCOUNTER — Other Ambulatory Visit: Payer: Self-pay

## 2020-08-04 ENCOUNTER — Ambulatory Visit (INDEPENDENT_AMBULATORY_CARE_PROVIDER_SITE_OTHER): Payer: No Typology Code available for payment source | Admitting: Physician Assistant

## 2020-08-04 VITALS — BP 171/90 | HR 120 | Temp 99.2°F | Ht 61.0 in | Wt 210.8 lb

## 2020-08-04 DIAGNOSIS — M726 Necrotizing fasciitis: Secondary | ICD-10-CM

## 2020-08-04 DIAGNOSIS — Z09 Encounter for follow-up examination after completed treatment for conditions other than malignant neoplasm: Secondary | ICD-10-CM

## 2020-08-04 NOTE — Progress Notes (Signed)
[facility] POST-OP OFFICE VISIT  08/04/2020  HPI: Michelle Stark is a 65 y.o. female ~ 2.5 months s/p initial incision and drainageof right perineal soft tissue infection involving the skin and the soft tissue and fascia for necrotizing infection with Dr Everlene Farrier which required two additional takebacks  Since I last saw her on 12/16, she has done very well. No longer needing dressings. No fever, chills. No other complaints.   Vital signs: There were no vitals taken for this visit.   Physical Exam: Constitutional: Well appearing female, NAD Skin: Previous I&D site on the right lateral anal verge has healed well  Assessment/Plan: This is a 65 y.o. female 2.5 months s/p initial incision and drainageof right perineal soft tissue infection involving the skin and the soft tissue and fascia for necrotizing infection with Dr Everlene Farrier which required two additional takebacks   - Nothing further from surgical standpoint. She can rtc on an as needed basis.   -- Lynden Oxford, PA-C [facility] 08/04/2020, 11:05 AM [phone] M-F: 7am - 4pm

## 2020-08-04 NOTE — Patient Instructions (Signed)
Keep wound clean and make sure your sugar is good. If you have any concerns or questions, please feel free to call our office.

## 2020-09-08 ENCOUNTER — Ambulatory Visit (INDEPENDENT_AMBULATORY_CARE_PROVIDER_SITE_OTHER): Payer: No Typology Code available for payment source | Admitting: Family Medicine

## 2020-09-08 ENCOUNTER — Other Ambulatory Visit: Payer: Self-pay

## 2020-09-08 ENCOUNTER — Telehealth: Payer: Self-pay

## 2020-09-08 VITALS — BP 130/78 | HR 99 | Temp 97.3°F | Ht 61.0 in | Wt 208.8 lb

## 2020-09-08 DIAGNOSIS — E1165 Type 2 diabetes mellitus with hyperglycemia: Secondary | ICD-10-CM | POA: Diagnosis not present

## 2020-09-08 DIAGNOSIS — I10 Essential (primary) hypertension: Secondary | ICD-10-CM

## 2020-09-08 DIAGNOSIS — Z794 Long term (current) use of insulin: Secondary | ICD-10-CM

## 2020-09-08 DIAGNOSIS — E7800 Pure hypercholesterolemia, unspecified: Secondary | ICD-10-CM

## 2020-09-08 DIAGNOSIS — E1169 Type 2 diabetes mellitus with other specified complication: Secondary | ICD-10-CM | POA: Diagnosis not present

## 2020-09-08 LAB — POCT GLYCOSYLATED HEMOGLOBIN (HGB A1C): Hemoglobin A1C: 5.7 % — AB (ref 4.0–5.6)

## 2020-09-08 MED ORDER — INSULIN ISOPHANE & REGULAR (HUMAN 70-30)100 UNIT/ML KWIKPEN: PEN_INJECTOR | SUBCUTANEOUS | 2 refills | Status: AC

## 2020-09-08 NOTE — Assessment & Plan Note (Signed)
Weight down to 208. Cont diabetic diet and weight loss.

## 2020-09-08 NOTE — Progress Notes (Signed)
Subjective:     Michelle Stark is a 65 y.o. female presenting for Follow-up (2 months- DM )     HPI  #Diabetes Currently taking novalin  Using medications without difficulties: No Hypoglycemic episodes:No  Hyperglycemic episodes:179 but shortly after eating Feet problems:No  Blood Sugars averaging: 109-112 Last HgbA1c:  Lab Results  Component Value Date   HGBA1C 5.7 (A) 09/08/2020   28 AM and 10 units PM Will only take the 10 units at night if sugar is high   Diabetes Health Maintenance Due:    Diabetes Health Maintenance Due  Topic Date Due  . FOOT EXAM  Never done  . OPHTHALMOLOGY EXAM  Never done  . URINE MICROALBUMIN  Never done  . HEMOGLOBIN A1C  03/08/2021      Review of Systems   Social History   Tobacco Use  Smoking Status Former Smoker  . Years: 4.00  . Types: Cigarettes  Smokeless Tobacco Never Used        Objective:    BP Readings from Last 3 Encounters:  09/08/20 130/78  08/04/20 (!) 171/90  07/08/20 (!) 142/84   Wt Readings from Last 3 Encounters:  09/08/20 208 lb 12 oz (94.7 kg)  08/04/20 210 lb 12.8 oz (95.6 kg)  07/08/20 214 lb 8 oz (97.3 kg)    BP 130/78   Pulse 99   Temp (!) 97.3 F (36.3 C) (Temporal)   Ht 5\' 1"  (1.549 m)   Wt 208 lb 12 oz (94.7 kg)   SpO2 97%   BMI 39.44 kg/m    Physical Exam Constitutional:      General: She is not in acute distress.    Appearance: She is well-developed. She is not diaphoretic.  HENT:     Right Ear: External ear normal.     Left Ear: External ear normal.  Eyes:     Conjunctiva/sclera: Conjunctivae normal.  Cardiovascular:     Rate and Rhythm: Normal rate and regular rhythm.     Heart sounds: No murmur heard.   Pulmonary:     Effort: Pulmonary effort is normal. No respiratory distress.     Breath sounds: Normal breath sounds. No wheezing.  Musculoskeletal:     Cervical back: Neck supple.  Skin:    General: Skin is warm and dry.     Capillary Refill: Capillary  refill takes less than 2 seconds.  Neurological:     Mental Status: She is alert. Mental status is at baseline.  Psychiatric:        Mood and Affect: Mood normal.        Behavior: Behavior normal.    Diabetic Foot Exam - Simple   Simple Foot Form Visual Inspection See comments: Yes Sensation Testing Intact to touch and monofilament testing bilaterally: Yes Pulse Check Posterior Tibialis and Dorsalis pulse intact bilaterally: Yes Comments Bilateral diffuse thickened yellow toenails           Assessment & Plan:   Problem List Items Addressed This Visit      Cardiovascular and Mediastinum   Hypertension    BP at goal. Cont healthy diet.         Endocrine   Type 2 diabetes mellitus with other specified complication (HCC) - Primary    Excellent control. Discussed working to decrease insulin dose with target hgba1c 6.5-7.0. She will decrease AM dose 28 units slowly every 2 days. Continue PM dose of 10 units. Call or Mychart. Return 3 months.  Relevant Medications   insulin isophane & regular human (HUMULIN 70/30 MIX) (70-30) 100 UNIT/ML KwikPen     Other   HLD (hyperlipidemia)    Patient believes she has atorvastatin at home. She will restart taking this. She will call to notify dose. Recheck in 3 months      Obesity, Class III, BMI 40-49.9 (morbid obesity) (HCC)    Weight down to 208. Cont diabetic diet and weight loss.       Relevant Medications   insulin isophane & regular human (HUMULIN 70/30 MIX) (70-30) 100 UNIT/ML KwikPen       Return in about 3 months (around 12/06/2020) for diabetes.  Lynnda Child, MD  This visit occurred during the SARS-CoV-2 public health emergency.  Safety protocols were in place, including screening questions prior to the visit, additional usage of staff PPE, and extensive cleaning of exam room while observing appropriate contact time as indicated for disinfecting solutions.

## 2020-09-08 NOTE — Assessment & Plan Note (Signed)
BP at goal. Cont healthy diet.

## 2020-09-08 NOTE — Assessment & Plan Note (Signed)
Patient believes she has atorvastatin at home. She will restart taking this. She will call to notify dose. Recheck in 3 months

## 2020-09-08 NOTE — Telephone Encounter (Signed)
This pt never came to lab to leave a urine sample.  Lab is placing future order.  Please alert pt to return to make sample.  Thanks Tam

## 2020-09-08 NOTE — Patient Instructions (Signed)
Diabetes - great job - lets work to decrease your insulin  Decrease morning dose of NPH every 2-3 days Go down by 3 units tomorrow  Goal is to find a morning and nighttime dose which are similar.   Goal is to be able to consistently take your nighttime dose.   Fasting: <140 2 hours after meals: <180

## 2020-09-08 NOTE — Assessment & Plan Note (Signed)
Excellent control. Discussed working to decrease insulin dose with target hgba1c 6.5-7.0. She will decrease AM dose 28 units slowly every 2 days. Continue PM dose of 10 units. Call or Mychart. Return 3 months.

## 2020-09-08 NOTE — Telephone Encounter (Signed)
Spoke to pt and she was already on her way to work. She will come to lab in the near future to give sample.

## 2020-09-16 LAB — HM DIABETES EYE EXAM

## 2020-10-03 ENCOUNTER — Encounter: Payer: Self-pay | Admitting: Family Medicine

## 2020-12-03 ENCOUNTER — Other Ambulatory Visit: Payer: Self-pay

## 2020-12-03 ENCOUNTER — Ambulatory Visit (INDEPENDENT_AMBULATORY_CARE_PROVIDER_SITE_OTHER): Payer: No Typology Code available for payment source | Admitting: Family Medicine

## 2020-12-03 VITALS — BP 122/78 | HR 95 | Temp 97.4°F | Ht 61.0 in | Wt 200.0 lb

## 2020-12-03 DIAGNOSIS — Z1211 Encounter for screening for malignant neoplasm of colon: Secondary | ICD-10-CM

## 2020-12-03 DIAGNOSIS — Z794 Long term (current) use of insulin: Secondary | ICD-10-CM

## 2020-12-03 DIAGNOSIS — Z91030 Bee allergy status: Secondary | ICD-10-CM

## 2020-12-03 DIAGNOSIS — I10 Essential (primary) hypertension: Secondary | ICD-10-CM

## 2020-12-03 DIAGNOSIS — E118 Type 2 diabetes mellitus with unspecified complications: Secondary | ICD-10-CM

## 2020-12-03 DIAGNOSIS — E7800 Pure hypercholesterolemia, unspecified: Secondary | ICD-10-CM | POA: Diagnosis not present

## 2020-12-03 LAB — MICROALBUMIN / CREATININE URINE RATIO
Creatinine,U: 178.9 mg/dL
Microalb Creat Ratio: 0.7 mg/g (ref 0.0–30.0)
Microalb, Ur: 1.3 mg/dL (ref 0.0–1.9)

## 2020-12-03 LAB — LIPID PANEL
Cholesterol: 215 mg/dL — ABNORMAL HIGH (ref 0–200)
HDL: 49.5 mg/dL (ref >39.00)
LDL Cholesterol: 147 mg/dL — ABNORMAL HIGH (ref 0–99)
NonHDL: 165.02
Total CHOL/HDL Ratio: 4
Triglycerides: 92 mg/dL (ref 0.0–149.0)
VLDL: 18.4 mg/dL (ref 0.0–40.0)

## 2020-12-03 LAB — POCT GLYCOSYLATED HEMOGLOBIN (HGB A1C): Hemoglobin A1C: 5.6 % (ref 4.0–5.6)

## 2020-12-03 MED ORDER — EPINEPHRINE 0.3 MG/0.3ML IJ SOAJ: 0.3000 mg | INTRAMUSCULAR | 1 refills | Status: AC | PRN

## 2020-12-03 NOTE — Assessment & Plan Note (Signed)
BP at goal. May consider ACEi if needed in the future.

## 2020-12-03 NOTE — Progress Notes (Signed)
Subjective:     Michelle Stark is a 65 y.o. female presenting for Follow-up (3 month- DM ) and Referral (Mammogram- [place] )     HPI  #Diabetes Currently taking insulin - 12 units of regular in the morning Using medications without difficulties: No Hypoglycemic episodes: yes - down to 82 Hyperglycemic episodes:No  Feet problems:No  Blood Sugars averaging: 130 is max Last HgbA1c:  Lab Results  Component Value Date   HGBA1C 5.6 12/03/2020    Diabetes Health Maintenance Due:    Diabetes Health Maintenance Due  Topic Date Due  . URINE MICROALBUMIN  Never done  . HEMOGLOBIN A1C  06/05/2021  . OPHTHALMOLOGY EXAM  09/16/2021  . FOOT EXAM  12/03/2021      Review of Systems  12/03/2020: Clinic - DM - Hgb a1c 5.7 - decrease AM 28 insulin and cont PM 10 units. REstart atorvastatin  - check lipids - poc hgb a1c  Social History   Tobacco Use  Smoking Status Former Smoker  . Years: 4.00  . Types: Cigarettes  Smokeless Tobacco Never Used        Objective:    BP Readings from Last 3 Encounters:  12/03/20 122/78  09/08/20 130/78  08/04/20 (!) 171/90   Wt Readings from Last 3 Encounters:  12/03/20 200 lb (90.7 kg)  09/08/20 208 lb 12 oz (94.7 kg)  08/04/20 210 lb 12.8 oz (95.6 kg)    BP 122/78   Pulse 95   Temp (!) 97.4 F (36.3 C) (Temporal)   Ht 5\' 1"  (1.549 m)   Wt 200 lb (90.7 kg)   SpO2 97%   BMI 37.79 kg/m    Physical Exam Constitutional:      General: She is not in acute distress.    Appearance: She is well-developed. She is not diaphoretic.  HENT:     Right Ear: External ear normal.     Left Ear: External ear normal.     Nose: Nose normal.  Eyes:     Conjunctiva/sclera: Conjunctivae normal.  Cardiovascular:     Rate and Rhythm: Normal rate and regular rhythm.     Heart sounds: No murmur heard.   Pulmonary:     Effort: Pulmonary effort is normal. No respiratory distress.     Breath sounds: Normal breath sounds. No wheezing.   Musculoskeletal:     Cervical back: Neck supple.  Skin:    General: Skin is warm and dry.     Capillary Refill: Capillary refill takes less than 2 seconds.  Neurological:     Mental Status: She is alert. Mental status is at baseline.  Psychiatric:        Mood and Affect: Mood normal.        Behavior: Behavior normal.     Diabetic Foot Exam - Simple   Simple Foot Form Diabetic Foot exam was performed with the following findings: Yes 12/03/2020 11:52 AM  Visual Inspection No deformities, no ulcerations, no other skin breakdown bilaterally: Yes Sensation Testing Intact to touch and monofilament testing bilaterally: Yes Pulse Check Posterior Tibialis and Dorsalis pulse intact bilaterally: Yes Comments          Assessment & Plan:   Problem List Items Addressed This Visit      Cardiovascular and Mediastinum   Hypertension    BP at goal. May consider ACEi if needed in the future.       Relevant Medications   EPINEPHrine (EPIPEN 2-PAK) 0.3 mg/0.3 mL IJ SOAJ injection  Endocrine   Controlled type 2 diabetes mellitus with complication, with long-term current use of insulin (HCC) - Primary    C/b HTN, HLD. Regular insulin 12 units daily. Discussed she could stop insulin and monitor on diet alone given excellent control. Also discussed option of metformin instead of insulin but she prefers to remain on insulin if medication is needed.  Lab Results  Component Value Date   HGBA1C 5.6 12/03/2020         Relevant Orders   POCT glycosylated hemoglobin (Hb A1C) (Completed)   Microalbumin / creatinine urine ratio   Ambulatory referral to Podiatry     Other   Hyperlipidemia    Did not restart cholesterol medication. Will recheck lipids today and encourage if not at goal, though pt hesitant to be on medications      Relevant Medications   EPINEPHrine (EPIPEN 2-PAK) 0.3 mg/0.3 mL IJ SOAJ injection   Other Relevant Orders   Lipid panel   Bee sting allergy     Anaphylaxis. Will update epipen      Relevant Medications   EPINEPHrine (EPIPEN 2-PAK) 0.3 mg/0.3 mL IJ SOAJ injection    Other Visit Diagnoses    Colon cancer screening       Relevant Orders   Fecal occult blood, imunochemical       Return in about 4 months (around 04/05/2021).  Lynnda Child, MD  This visit occurred during the SARS-CoV-2 public health emergency.  Safety protocols were in place, including screening questions prior to the visit, additional usage of staff PPE, and extensive cleaning of exam room while observing appropriate contact time as indicated for disinfecting solutions.

## 2020-12-03 NOTE — Assessment & Plan Note (Signed)
Anaphylaxis. Will update epipen

## 2020-12-03 NOTE — Patient Instructions (Addendum)
#  Diabetes - would recommend that you consider stopping Insulin - Monitor blood sugars for a week - if you start seeing fasting CBG >140 or 2 hours after eating >160 would restart at low dose  Please call the location of your choice from the menu below to schedule your Mammogram and/or Bone Density appointment.      [place]  [facility]   Phone:  [phone]   [address]                                                                            [place], [place] [address]                                            Services: 3D Mammogram and Bone Density

## 2020-12-03 NOTE — Assessment & Plan Note (Signed)
Did not restart cholesterol medication. Will recheck lipids today and encourage if not at goal, though pt hesitant to be on medications

## 2020-12-03 NOTE — Assessment & Plan Note (Signed)
C/b HTN, HLD. Regular insulin 12 units daily. Discussed she could stop insulin and monitor on diet alone given excellent control. Also discussed option of metformin instead of insulin but she prefers to remain on insulin if medication is needed.  Lab Results  Component Value Date   HGBA1C 5.6 12/03/2020

## 2020-12-12 ENCOUNTER — Other Ambulatory Visit: Payer: Self-pay

## 2020-12-12 ENCOUNTER — Ambulatory Visit: Payer: No Typology Code available for payment source | Admitting: Podiatry

## 2020-12-12 ENCOUNTER — Encounter: Payer: Self-pay | Admitting: Podiatry

## 2020-12-12 DIAGNOSIS — B351 Tinea unguium: Secondary | ICD-10-CM | POA: Diagnosis not present

## 2020-12-12 DIAGNOSIS — M79674 Pain in right toe(s): Secondary | ICD-10-CM | POA: Diagnosis not present

## 2020-12-12 DIAGNOSIS — M79675 Pain in left toe(s): Secondary | ICD-10-CM | POA: Diagnosis not present

## 2020-12-12 DIAGNOSIS — E119 Type 2 diabetes mellitus without complications: Secondary | ICD-10-CM

## 2020-12-12 DIAGNOSIS — E0843 Diabetes mellitus due to underlying condition with diabetic autonomic (poly)neuropathy: Secondary | ICD-10-CM

## 2020-12-12 NOTE — Progress Notes (Signed)
   SUBJECTIVE Patient with a history of diabetes mellitus presents to office today as a new patient referral from her PCP for diabetic foot exam.  Patient states that in October 2021 she was in the hospital for a diabetic coma.  She was unaware at that time that she was diabetic.  Over the last 6 months she got her A1c level from 12.0-5.7 most recently.  She is doing very well and her primary care doctor recommended that she follows up with podiatry for routine diabetic foot exam.  She is also complaining of elongated, thickened nails that cause pain while ambulating in shoes.  She is unable to trim her own nails. Patient is here for further evaluation and treatment.   Past Medical History:  Diagnosis Date  . AKI (acute kidney injury) (HCC) 04/29/2020  . Diabetes mellitus without complication (HCC)   . DKA (diabetic ketoacidosis) (HCC) 04/29/2020  . Hyperlipidemia   . Hypertension   . Metabolic acidosis due to diabetes mellitus (HCC) 04/29/2020  . Obesity     OBJECTIVE General Patient is awake, alert, and oriented x 3 and in no acute distress. Derm Skin is dry and supple bilateral. Negative open lesions or macerations. Remaining integument unremarkable. Nails are tender, long, thickened and dystrophic with subungual debris, consistent with onychomycosis, 1-5 bilateral. No signs of infection noted. Vasc  DP and PT pedal pulses palpable bilaterally. Temperature gradient within normal limits.  Neuro Epicritic and protective threshold sensation intact bilaterally.  Musculoskeletal Exam No symptomatic pedal deformities noted bilateral. Muscular strength within normal limits.  ASSESSMENT 1. Diabetes Mellitus w/ peripheral neuropathy 2. Onychomycosis of nail due to dermatophyte bilateral 3.  Encounter for diabetic foot exam  PLAN OF CARE 1. Patient evaluated today. 2. Instructed to maintain good pedal hygiene and foot care. Stressed importance of controlling blood sugar.  3. Mechanical  debridement of nails 1-5 bilaterally performed using a nail nipper. Filed with dremel without incident.  4.  Today we discussed different treatment options for the patient's toenail fungus including oral, topical, and laser treatment modalities.  The patient would like to pursue laser treatment modality.  Appointment was set up with our nurse in our [place] office for antifungal laser treatments  5.  Return to clinic annually   Felecia Shelling, DPM [facility]  Dr. Felecia Shelling, DPM    [address] [place], [place] [address]                Office [phone]  Fax [phone]

## 2020-12-19 ENCOUNTER — Other Ambulatory Visit: Payer: Self-pay

## 2020-12-19 ENCOUNTER — Ambulatory Visit (INDEPENDENT_AMBULATORY_CARE_PROVIDER_SITE_OTHER): Payer: Self-pay

## 2020-12-19 DIAGNOSIS — M79674 Pain in right toe(s): Secondary | ICD-10-CM

## 2020-12-19 DIAGNOSIS — B351 Tinea unguium: Secondary | ICD-10-CM

## 2020-12-19 DIAGNOSIS — M79675 Pain in left toe(s): Secondary | ICD-10-CM

## 2020-12-19 NOTE — Progress Notes (Signed)
Patient presents today for the 1st laser treatment. Diagnosed with mycotic nail infection by Dr. Logan Bores.   Toenail most affected 1-5 bilateral nails.  All other systems are negative.  Nails were filed thin. Laser therapy was administered to 1-5 toenails bilateral and patient tolerated the treatment well. All safety precautions were in place.    Follow up in 4 weeks for laser # 2nd.  Picture of nails taken today to document visual progress

## 2021-01-19 ENCOUNTER — Ambulatory Visit (INDEPENDENT_AMBULATORY_CARE_PROVIDER_SITE_OTHER): Payer: No Typology Code available for payment source

## 2021-01-19 ENCOUNTER — Other Ambulatory Visit: Payer: Self-pay

## 2021-01-19 DIAGNOSIS — B351 Tinea unguium: Secondary | ICD-10-CM

## 2021-01-19 DIAGNOSIS — M79675 Pain in left toe(s): Secondary | ICD-10-CM

## 2021-01-19 DIAGNOSIS — M79674 Pain in right toe(s): Secondary | ICD-10-CM

## 2021-01-19 NOTE — Progress Notes (Signed)
Patient presents today for the 2nd laser treatment. Diagnosed with mycotic nail infection by Dr. Logan Bores.   Toenail most affected 1-5 bilateral nails.  All other systems are negative.  Nails were filed thin. Laser therapy was administered to 1-5 toenails bilateral and patient tolerated the treatment well. All safety precautions were in place.    Follow up in 4 weeks for laser # 3.

## 2021-01-19 NOTE — Patient Instructions (Signed)

## 2021-02-16 ENCOUNTER — Other Ambulatory Visit: Payer: Self-pay

## 2021-02-16 ENCOUNTER — Ambulatory Visit (INDEPENDENT_AMBULATORY_CARE_PROVIDER_SITE_OTHER): Payer: No Typology Code available for payment source | Admitting: *Deleted

## 2021-02-16 DIAGNOSIS — B351 Tinea unguium: Secondary | ICD-10-CM

## 2021-02-16 DIAGNOSIS — M79674 Pain in right toe(s): Secondary | ICD-10-CM

## 2021-02-16 DIAGNOSIS — M79675 Pain in left toe(s): Secondary | ICD-10-CM

## 2021-02-16 NOTE — Progress Notes (Signed)
Patient presents today for the 3rd laser treatment. Diagnosed with mycotic nail infection by Dr. Logan Bores.   Toenail most affected 1-5 bilateral nails. Not too much improvement in the nails so far.  All other systems are negative.  Nails were filed thin. Laser therapy was administered to 1-5 toenails bilateral and patient tolerated the treatment well. All safety precautions were in place.    Follow up in 6 weeks for laser # 4.

## 2021-04-03 ENCOUNTER — Ambulatory Visit (INDEPENDENT_AMBULATORY_CARE_PROVIDER_SITE_OTHER): Payer: No Typology Code available for payment source

## 2021-04-03 ENCOUNTER — Other Ambulatory Visit: Payer: Self-pay

## 2021-04-03 DIAGNOSIS — M79674 Pain in right toe(s): Secondary | ICD-10-CM

## 2021-04-03 DIAGNOSIS — B351 Tinea unguium: Secondary | ICD-10-CM

## 2021-04-03 DIAGNOSIS — M79675 Pain in left toe(s): Secondary | ICD-10-CM

## 2021-04-03 NOTE — Progress Notes (Signed)
Patient presents today for the 4th laser treatment. Diagnosed with mycotic nail infection by Dr. Logan Bores.   Toenail most affected 1-5 bilateral nails. Not too much improvement in the nails so far.  All other systems are negative.  Nails were filed thin. Laser therapy was administered to 1-5 toenails bilateral and patient tolerated the treatment well. All safety precautions were in place.    Follow up in 6 weeks for laser # 5.

## 2021-05-15 ENCOUNTER — Other Ambulatory Visit: Payer: Self-pay

## 2021-05-15 ENCOUNTER — Other Ambulatory Visit: Payer: No Typology Code available for payment source

## 2021-05-15 ENCOUNTER — Ambulatory Visit (INDEPENDENT_AMBULATORY_CARE_PROVIDER_SITE_OTHER): Payer: Self-pay | Admitting: *Deleted

## 2021-05-15 DIAGNOSIS — B351 Tinea unguium: Secondary | ICD-10-CM

## 2021-05-15 DIAGNOSIS — M79675 Pain in left toe(s): Secondary | ICD-10-CM

## 2021-05-15 DIAGNOSIS — M79674 Pain in right toe(s): Secondary | ICD-10-CM

## 2021-05-15 NOTE — Progress Notes (Signed)
Patient presents today for the 5th laser treatment. Diagnosed with mycotic nail infection by Dr. Logan Bores.   Toenail most affected 1-5 bilateral nails. The nails are starting to show slight improvements  All other systems are negative.  Nails were filed thin. Laser therapy was administered to 1-5 toenails bilateral and patient tolerated the treatment well. All safety precautions were in place.    Follow up in 8 weeks for laser # 6

## 2021-06-16 IMAGING — CT CT PELVIS W/O CM
2 of 3 series · 16 of 46 positions shown, 18 images · non-contrast
Comparison: 04/29/2020 at 8966 hours

CLINICAL DATA: Pelvic abscess

EXAM:
CT PELVIS WITHOUT CONTRAST
TECHNIQUE: Multidetector CT imaging of the pelvis was performed following the
standard protocol without intravenous contrast.

[Series 2: routine abd/pel wo · axial · 0.98mm/px · z∈[-1341,-1046]mm · 13 of 69 slices shown, 15 images]
[im 5/69  soft-tissue]
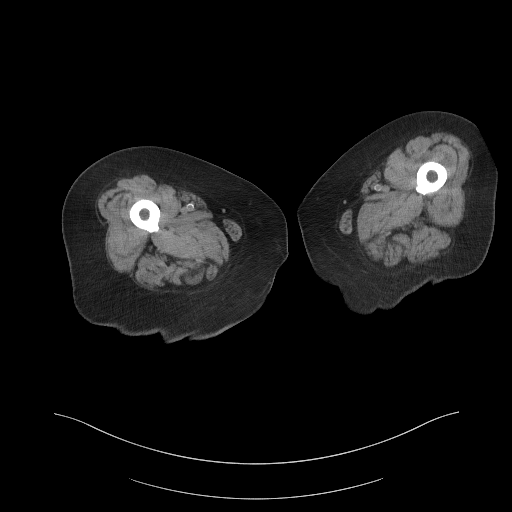
[im 5/69  bone]
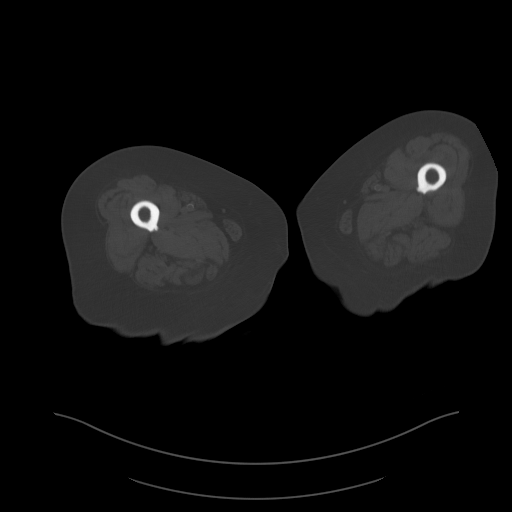
[im 9/69  soft-tissue]
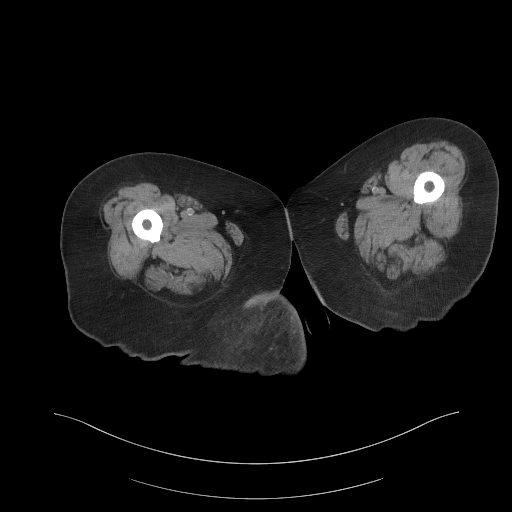
[im 14/69  soft-tissue]
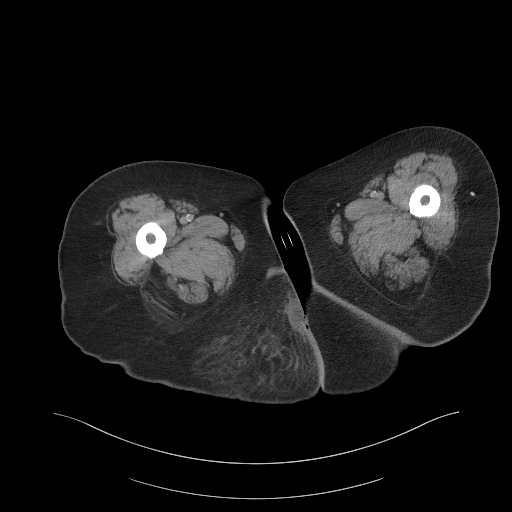
[im 20/69  soft-tissue]
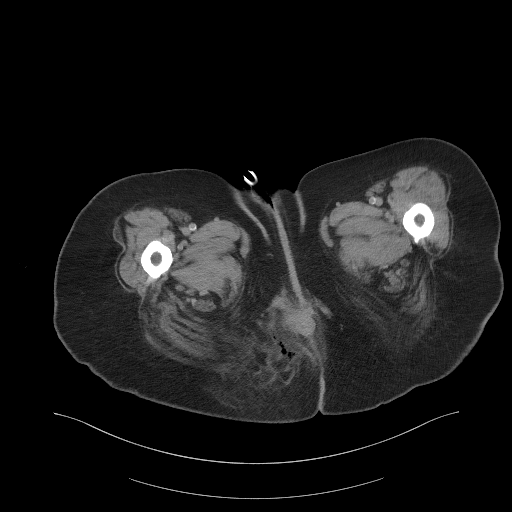
[im 25/69  soft-tissue]
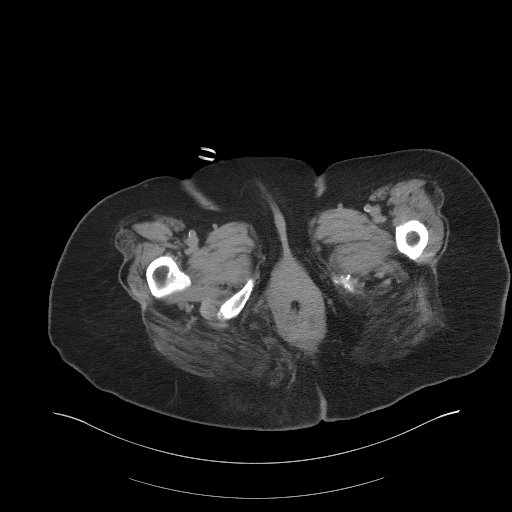
[im 29/69  soft-tissue]
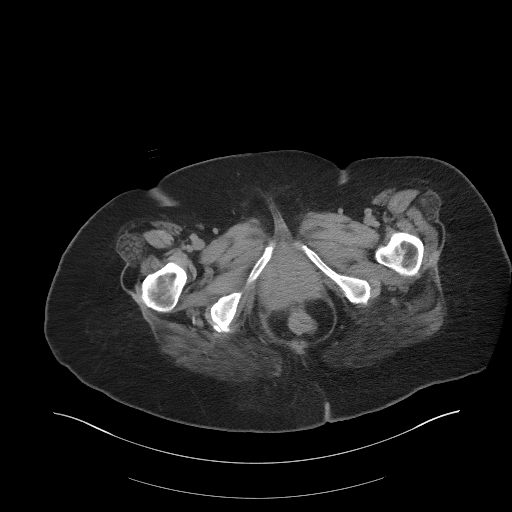
[im 36/69  soft-tissue]
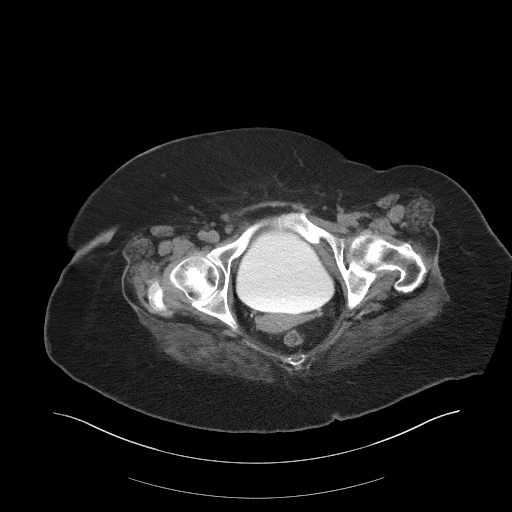
[im 40/69  soft-tissue]
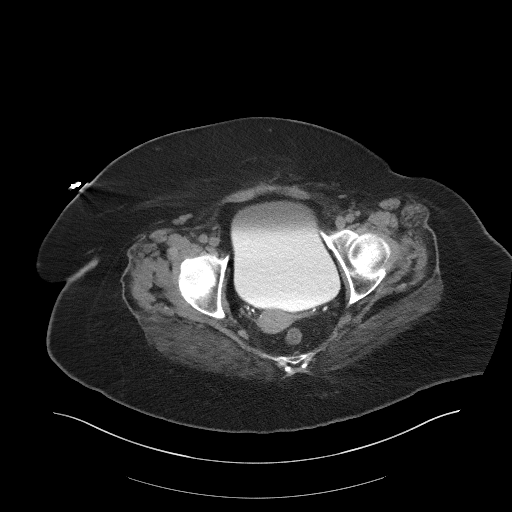
[im 44/69  soft-tissue]
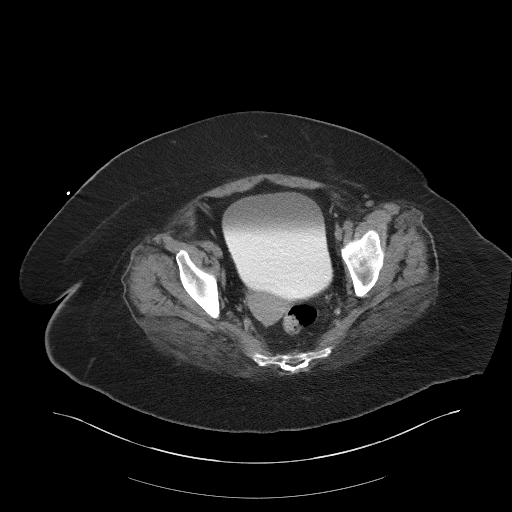
[im 44/69  bone]
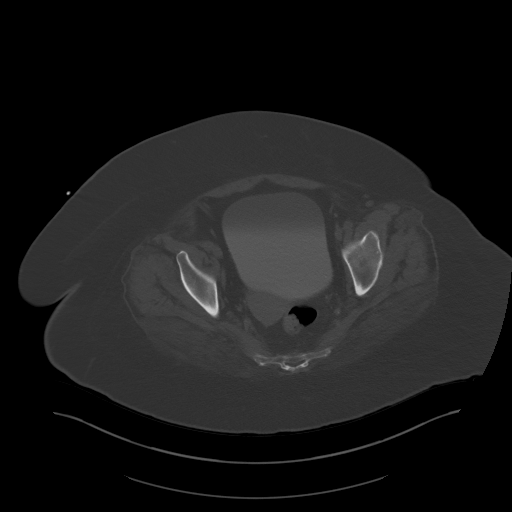
[im 49/69  soft-tissue]
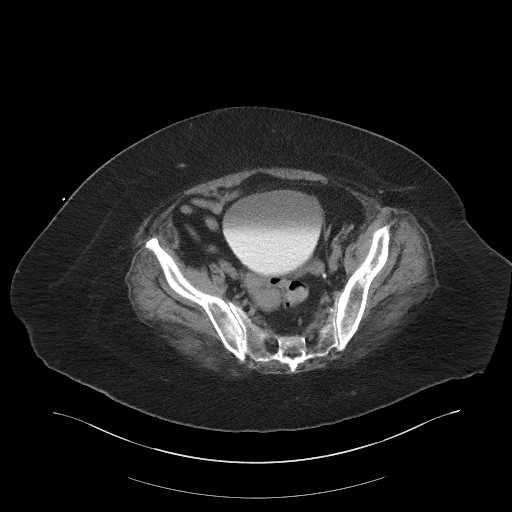
[im 55/69  soft-tissue]
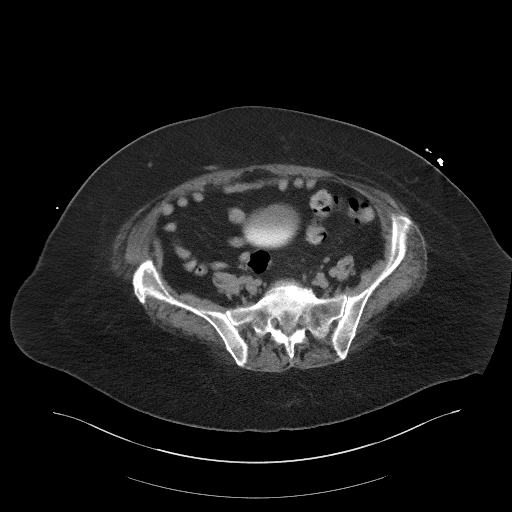
[im 60/69  soft-tissue]
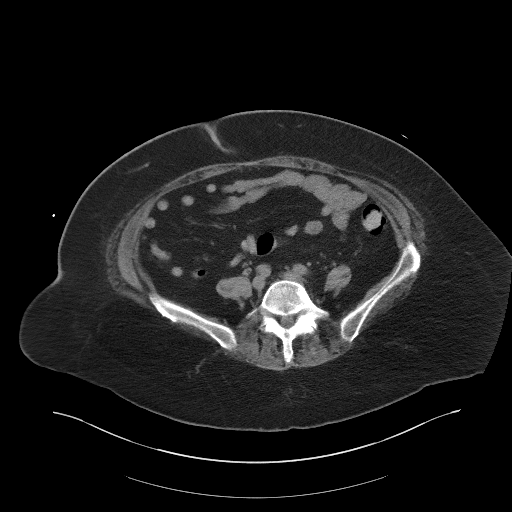
[im 64/69  soft-tissue]
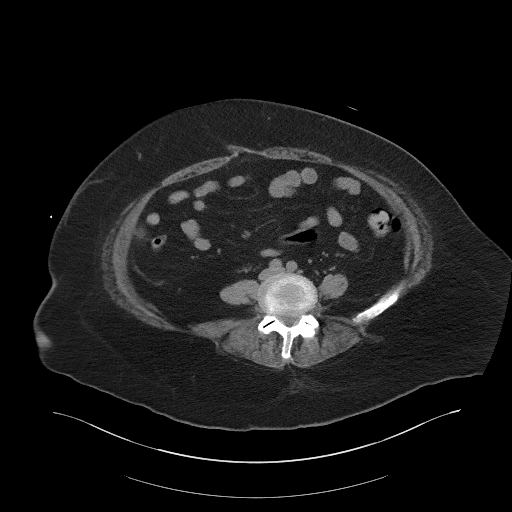

[Series 4: coronal st · coronal · 0.69mm/px · 3 of 99 slices shown]
[im 33/99  soft-tissue]
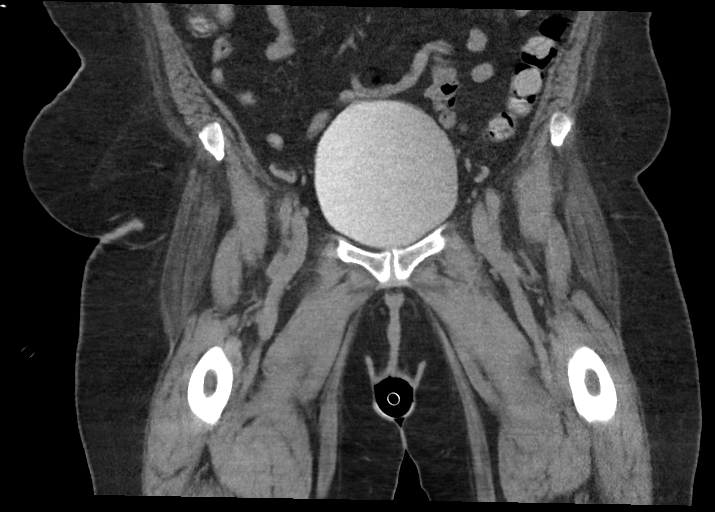
[im 44/99  soft-tissue]
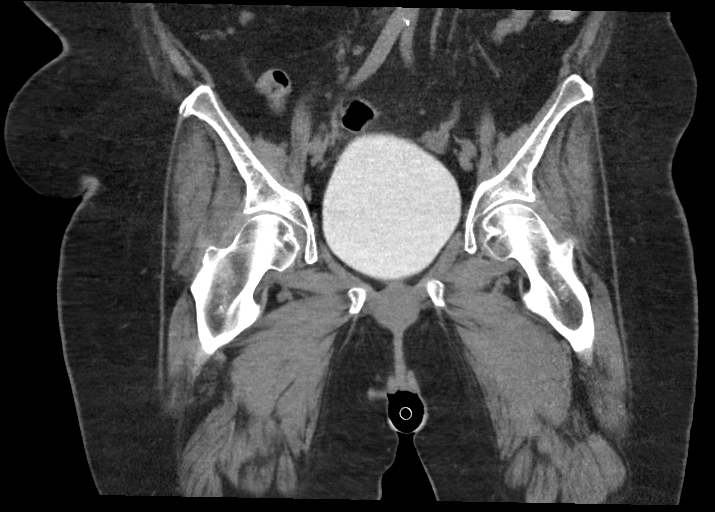
[im 55/99  soft-tissue]
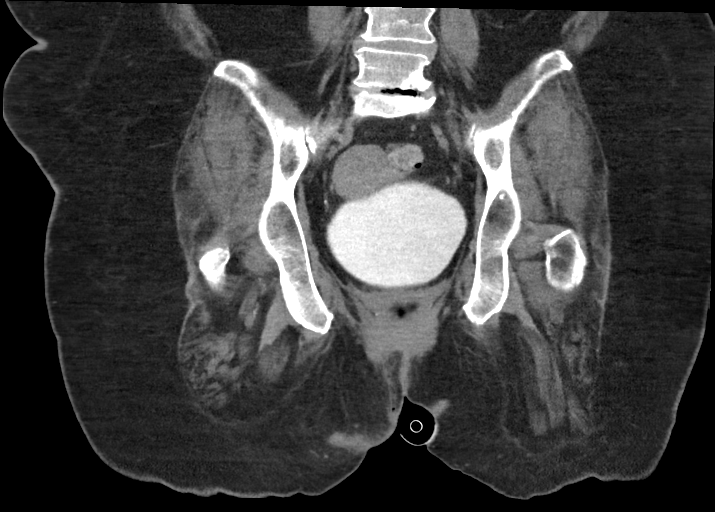

[16 of 46 positions shown; findings below may reference images not displayed]

FINDINGS: Urinary Tract: Residual contrast material fills the bladder. No
bladder wall thickening or filling defect.

Bowel: Visualized portions of the small bowel and colon are normal
in appearance. The appendix is normal.

Vascular/Lymphatic: Few scattered calcifications in the visualized
iliac and external iliac arteries. No aneurysm.

Reproductive: Uterus and ovaries are not enlarged. No free fluid in
the pelvis.

Other: Soft tissue gas and soft tissue infiltration demonstrated in
the medial aspect of the right buttocks region extending to the
gluteal crease, mostly inferior to the anus. Changes are consistent
with cellulitis. The presence of soft tissue gas may indicate
Tiger gangrene. Small medial fluid collection in the
superficial subcutaneous tissues measures 1.4 x 2.8 cm. This is
consistent with a small abscess. Appearances are unchanged since the
previous study.

Musculoskeletal: Degenerative changes in the lower lumbar spine. No
focal bone lesion or bone destruction.
IMPRESSION: 1. Right medial gluteal soft tissue infiltration and soft tissue gas
consistent with cellulitis, possibly representing Tiger
gangrene.
2. Small subcutaneous abscess.
3. No change since prior study.

## 2021-07-10 ENCOUNTER — Ambulatory Visit (INDEPENDENT_AMBULATORY_CARE_PROVIDER_SITE_OTHER): Payer: No Typology Code available for payment source | Admitting: *Deleted

## 2021-07-10 ENCOUNTER — Other Ambulatory Visit: Payer: Self-pay

## 2021-07-10 DIAGNOSIS — B351 Tinea unguium: Secondary | ICD-10-CM

## 2021-07-10 DIAGNOSIS — M79675 Pain in left toe(s): Secondary | ICD-10-CM

## 2021-07-10 DIAGNOSIS — M79674 Pain in right toe(s): Secondary | ICD-10-CM

## 2021-07-10 NOTE — Progress Notes (Signed)
Patient presents today for the 6th laser treatment. Diagnosed with mycotic nail infection by Dr. Logan Bores.   Toenail most affected 1-5 bilateral nails. The nails have not shown a big improvement, but patient says she thinks they look better.  All other systems are negative.  Nails were filed thin. Laser therapy was administered to 1-5 toenails bilateral and patient tolerated the treatment well. All safety precautions were in place.    Patient has completed the recommended laser treatments. He will follow up with Dr. Logan Bores in 3 months to evaluate progress.

## 2021-10-12 ENCOUNTER — Ambulatory Visit: Payer: No Typology Code available for payment source | Admitting: Podiatry

## 2021-11-06 ENCOUNTER — Ambulatory Visit (INDEPENDENT_AMBULATORY_CARE_PROVIDER_SITE_OTHER): Payer: No Typology Code available for payment source | Admitting: Podiatry

## 2021-11-06 DIAGNOSIS — M79674 Pain in right toe(s): Secondary | ICD-10-CM

## 2021-11-06 DIAGNOSIS — B351 Tinea unguium: Secondary | ICD-10-CM | POA: Diagnosis not present

## 2021-11-06 DIAGNOSIS — M79675 Pain in left toe(s): Secondary | ICD-10-CM

## 2021-11-06 DIAGNOSIS — E119 Type 2 diabetes mellitus without complications: Secondary | ICD-10-CM

## 2021-11-06 NOTE — Progress Notes (Signed)
? ?  SUBJECTIVE ?Patient with a history of diabetes mellitus presents to office today complaining of elongated, thickened nails that cause pain while ambulating in shoes.  Patient is unable to trim their own nails.  Patient has gone through 6 sessions of antifungal laser treatment in our [place] office.  Patient is here for further evaluation and treatment. ? ? ?Past Medical History:  ?Diagnosis Date  ? AKI (acute kidney injury) (HCC) 04/29/2020  ? Diabetes mellitus without complication (HCC)   ? DKA (diabetic ketoacidosis) (HCC) 04/29/2020  ? Hyperlipidemia   ? Hypertension   ? Metabolic acidosis due to diabetes mellitus (HCC) 04/29/2020  ? Obesity   ? ?Past Surgical History:  ?Procedure Laterality Date  ? CHOLECYSTECTOMY    ? INCISION AND DRAINAGE ABSCESS N/A 05/02/2020  ? Procedure: INCISION AND DRAINAGE ABSCESS;  Surgeon: Leafy Ro, MD;  Location: [facility];  Service: General;  Laterality: N/A;  ? INCISION AND DRAINAGE ABSCESS N/A 05/05/2020  ? Procedure: INCISION AND DRAINAGE PERINEAL ABSCESS;  Surgeon: Leafy Ro, MD;  Location: [facility];  Service: General;  Laterality: N/A;  ? INCISION AND DRAINAGE ABSCESS N/A 04/30/2020  ? Procedure: INCISION AND DRAINAGE PERIRECTAL  ABSCESS;  Surgeon: Leafy Ro, MD;  Location: [facility];  Service: General;  Laterality: N/A;  ? RHINOPLASTY    ? ?Allergies  ?Allergen Reactions  ? Bee Venom Anaphylaxis  ? Ciprofloxacin Itching  ? Lisinopril   ?  Cough ?  ? ? ? ?OBJECTIVE ?General Patient is awake, alert, and oriented x 3 and in no acute distress. ?Derm Skin is dry and supple bilateral. Negative open lesions or macerations. Remaining integument unremarkable. Nails are tender, long, thickened and dystrophic with subungual debris, consistent with onychomycosis, 1-5 bilateral. No signs of infection noted. ?Vasc  DP and PT pedal pulses palpable bilaterally. Temperature gradient within normal limits.  ?Neuro Epicritic and protective threshold sensation diminished  bilaterally.  ?Musculoskeletal Exam No symptomatic pedal deformities noted bilateral. Muscular strength within normal limits. ? ?ASSESSMENT ?1. Diabetes Mellitus w/ peripheral neuropathy ?2.  Pain due to onychomycosis of toenails bilateral ? ?PLAN OF CARE ?1. Patient evaluated today.  Comprehensive diabetic foot exam performed today ?2. Instructed to maintain good pedal hygiene and foot care. Stressed importance of controlling blood sugar.  ?3. Mechanical debridement of nails 1-5 bilaterally performed using a nail nipper. Filed with dremel without incident.  ?4.  OTC Tolcylen antifungal topical was provided for the patient at checkout  ?5.  Return to clinic in 6 months ? ? ? ?Felecia Shelling, DPM ?[facility] ? ?Dr. Felecia Shelling, DPM  ?  ?[address]                                      ?[place], [place] [address]                ?Office [phone]  ?Fax [phone] ? ? ? ? ? ?

## 2022-05-07 ENCOUNTER — Ambulatory Visit: Payer: No Typology Code available for payment source | Admitting: Podiatry
